# Patient Record
Sex: Female | Born: 1985
Health system: Southern US, Community
[De-identification: ages and names within clinical notes are randomized; demographics above are authoritative.]

## PROBLEM LIST (undated history)

## (undated) ENCOUNTER — Inpatient Hospital Stay: Payer: Self-pay

## (undated) DIAGNOSIS — F329 Major depressive disorder, single episode, unspecified: Secondary | ICD-10-CM

## (undated) DIAGNOSIS — E785 Hyperlipidemia, unspecified: Secondary | ICD-10-CM

## (undated) DIAGNOSIS — F32A Depression, unspecified: Secondary | ICD-10-CM

## (undated) DIAGNOSIS — E669 Obesity, unspecified: Secondary | ICD-10-CM

## (undated) DIAGNOSIS — G43909 Migraine, unspecified, not intractable, without status migrainosus: Secondary | ICD-10-CM

## (undated) DIAGNOSIS — E282 Polycystic ovarian syndrome: Secondary | ICD-10-CM

## (undated) DIAGNOSIS — F419 Anxiety disorder, unspecified: Secondary | ICD-10-CM

## (undated) HISTORY — DX: Hyperlipidemia, unspecified: E78.5

## (undated) HISTORY — DX: Migraine, unspecified, not intractable, without status migrainosus: G43.909

## (undated) HISTORY — DX: Polycystic ovarian syndrome: E28.2

## (undated) HISTORY — DX: Major depressive disorder, single episode, unspecified: F32.9

## (undated) HISTORY — DX: Anxiety disorder, unspecified: F41.9

## (undated) HISTORY — DX: Depression, unspecified: F32.A

## (undated) HISTORY — PX: HUMERUS FRACTURE SURGERY: SHX670

---
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
## 2006-09-21 ENCOUNTER — Emergency Department: Payer: Self-pay | Admitting: Emergency Medicine

## 2006-09-23 ENCOUNTER — Ambulatory Visit: Payer: Self-pay | Admitting: Emergency Medicine

## 2007-02-01 ENCOUNTER — Emergency Department: Payer: Self-pay

## 2008-11-24 ENCOUNTER — Emergency Department: Payer: Self-pay | Admitting: Emergency Medicine

## 2009-04-19 HISTORY — PX: WISDOM TOOTH EXTRACTION: SHX21

## 2011-04-20 HISTORY — PX: APPENDECTOMY: SHX54

## 2011-09-09 LAB — CBC
HGB: 13.7 g/dL (ref 12.0–16.0)
MCH: 27.9 pg (ref 26.0–34.0)
MCHC: 32.4 g/dL (ref 32.0–36.0)
MCV: 86 fL (ref 80–100)
Platelet: 304 10*3/uL (ref 150–440)
RBC: 4.91 10*6/uL (ref 3.80–5.20)
WBC: 13.1 10*3/uL — ABNORMAL HIGH (ref 3.6–11.0)

## 2011-09-09 LAB — COMPREHENSIVE METABOLIC PANEL
Alkaline Phosphatase: 54 U/L (ref 50–136)
BUN: 8 mg/dL (ref 7–18)
Bilirubin,Total: 0.8 mg/dL (ref 0.2–1.0)
Calcium, Total: 8.9 mg/dL (ref 8.5–10.1)
Chloride: 104 mmol/L (ref 98–107)
Creatinine: 0.58 mg/dL — ABNORMAL LOW (ref 0.60–1.30)
EGFR (African American): >60
EGFR (Non-African Amer.): >60
Osmolality: 277 (ref 275–301)
Potassium: 4.1 mmol/L (ref 3.5–5.1)
SGOT(AST): 30 U/L (ref 15–37)
SGPT (ALT): 66 U/L
Total Protein: 8 g/dL (ref 6.4–8.2)

## 2011-09-09 LAB — URINALYSIS, COMPLETE
Bacteria: NONE SEEN
Glucose,UR: NEGATIVE mg/dL (ref 0–75)
Ketone: NEGATIVE
Nitrite: NEGATIVE
Specific Gravity: 1.015 (ref 1.003–1.030)
Squamous Epithelial: 7
WBC UR: 1 /HPF (ref 0–5)

## 2011-09-09 LAB — PREGNANCY, URINE: Pregnancy Test, Urine: NEGATIVE m[IU]/mL

## 2011-09-09 LAB — LIPASE, BLOOD: Lipase: 115 U/L (ref 73–393)

## 2011-09-10 ENCOUNTER — Observation Stay: Payer: Self-pay | Admitting: Surgery

## 2011-09-13 LAB — PATHOLOGY REPORT

## 2012-08-15 ENCOUNTER — Inpatient Hospital Stay: Payer: Self-pay | Admitting: Student

## 2012-08-15 LAB — COMPREHENSIVE METABOLIC PANEL
Anion Gap: 4 — ABNORMAL LOW (ref 7–16)
BUN: 11 mg/dL (ref 7–18)
Chloride: 103 mmol/L (ref 98–107)
Co2: 30 mmol/L (ref 21–32)
Creatinine: 0.69 mg/dL (ref 0.60–1.30)
Glucose: 87 mg/dL (ref 65–99)
Osmolality: 273 (ref 275–301)
SGOT(AST): 22 U/L (ref 15–37)
Sodium: 137 mmol/L (ref 136–145)

## 2012-08-15 LAB — URINALYSIS, COMPLETE
Bilirubin,UR: NEGATIVE
Glucose,UR: NEGATIVE mg/dL (ref 0–75)
Nitrite: NEGATIVE
Ph: 5 (ref 4.5–8.0)
Protein: NEGATIVE
RBC,UR: 5 /HPF (ref 0–5)
Specific Gravity: 1.023 (ref 1.003–1.030)
Squamous Epithelial: 7

## 2012-08-15 LAB — CBC
HCT: 39.5 % (ref 35.0–47.0)
HGB: 13.3 g/dL (ref 12.0–16.0)
MCH: 29 pg (ref 26.0–34.0)
MCHC: 33.7 g/dL (ref 32.0–36.0)
Platelet: 362 10*3/uL (ref 150–440)
RBC: 4.59 10*6/uL (ref 3.80–5.20)
WBC: 8.8 10*3/uL (ref 3.6–11.0)

## 2012-08-16 LAB — CBC WITH DIFFERENTIAL/PLATELET
Basophil: 3 %
Comment - H1-Com1: NORMAL
HCT: 36.9 % (ref 35.0–47.0)
Lymphocytes: 27 %
MCH: 29.5 pg (ref 26.0–34.0)
MCV: 86 fL (ref 80–100)
Monocytes: 10 %
RBC: 4.32 10*6/uL (ref 3.80–5.20)
Segmented Neutrophils: 54 %
WBC: 9.8 10*3/uL (ref 3.6–11.0)

## 2012-08-16 LAB — BASIC METABOLIC PANEL
BUN: 10 mg/dL (ref 7–18)
Calcium, Total: 8.8 mg/dL (ref 8.5–10.1)
Creatinine: 0.47 mg/dL — ABNORMAL LOW (ref 0.60–1.30)
EGFR (African American): >60
EGFR (Non-African Amer.): >60

## 2012-08-16 LAB — TSH: Thyroid Stimulating Horm: 10.6 u[IU]/mL — ABNORMAL HIGH

## 2012-08-16 LAB — LIPID PANEL
HDL Cholesterol: 20 mg/dL — ABNORMAL LOW (ref 40–60)
VLDL Cholesterol, Calc: 46 mg/dL — ABNORMAL HIGH (ref 5–40)

## 2012-08-16 LAB — HEMOGLOBIN A1C: Hemoglobin A1C: 4.3 % (ref 4.2–6.3)

## 2012-08-17 LAB — VANCOMYCIN, TROUGH: Vancomycin, Trough: 9 ug/mL — ABNORMAL LOW (ref 10–20)

## 2012-08-18 LAB — VANCOMYCIN, TROUGH: Vancomycin, Trough: 8 ug/mL — ABNORMAL LOW (ref 10–20)

## 2012-09-07 LAB — TSH: TSH: 4.21 u[IU]/mL (ref 0.41–5.90)

## 2012-12-19 ENCOUNTER — Ambulatory Visit: Payer: Self-pay

## 2013-01-31 LAB — LIPID PANEL
Cholesterol: 220 mg/dL — AB (ref 0–200)
HDL: 25 mg/dL — AB (ref 35–70)
LDL Cholesterol: 138 mg/dL

## 2013-02-14 LAB — CBC AND DIFFERENTIAL
HEMATOCRIT: 39 % (ref 36–46)
HEMOGLOBIN: 13.1 g/dL (ref 12.0–16.0)
PLATELETS: 342 10*3/uL (ref 150–399)

## 2013-02-14 LAB — BASIC METABOLIC PANEL
CREATININE: 0.6 mg/dL (ref 0.5–1.1)
Glucose: 78 mg/dL

## 2013-02-14 LAB — HEPATIC FUNCTION PANEL: BILIRUBIN, TOTAL: 0.5 mg/dL

## 2013-02-14 LAB — HEMOGLOBIN A1C: Hemoglobin A1C: 5.3

## 2013-12-07 ENCOUNTER — Emergency Department: Payer: Self-pay | Admitting: Emergency Medicine

## 2013-12-07 LAB — CBC WITH DIFFERENTIAL/PLATELET
BASOS PCT: 1 %
Basophil #: 0.1 10*3/uL (ref 0.0–0.1)
EOS PCT: 0.5 %
Eosinophil #: 0.1 10*3/uL (ref 0.0–0.7)
HCT: 39.4 % (ref 35.0–47.0)
HGB: 12.7 g/dL (ref 12.0–16.0)
LYMPHS ABS: 1.2 10*3/uL (ref 1.0–3.6)
LYMPHS PCT: 8.1 %
MCH: 28.1 pg (ref 26.0–34.0)
MCHC: 32.2 g/dL (ref 32.0–36.0)
MCV: 87 fL (ref 80–100)
MONO ABS: 1.6 x10 3/mm — AB (ref 0.2–0.9)
MONOS PCT: 10.5 %
NEUTROS ABS: 11.9 10*3/uL — AB (ref 1.4–6.5)
Neutrophil %: 79.9 %
Platelet: 272 10*3/uL (ref 150–440)
RBC: 4.52 10*6/uL (ref 3.80–5.20)
RDW: 13.1 % (ref 11.5–14.5)
WBC: 14.9 10*3/uL — ABNORMAL HIGH (ref 3.6–11.0)

## 2013-12-07 LAB — MONONUCLEOSIS SCREEN: Mono Test: NEGATIVE

## 2013-12-09 LAB — BETA STREP CULTURE(ARMC)

## 2014-01-26 ENCOUNTER — Emergency Department: Payer: Self-pay | Admitting: Emergency Medicine

## 2014-01-26 LAB — URINALYSIS, COMPLETE
BACTERIA: NONE SEEN
BILIRUBIN, UR: NEGATIVE
Blood: NEGATIVE
Glucose,UR: NEGATIVE mg/dL (ref 0–75)
Ketone: NEGATIVE
Leukocyte Esterase: NEGATIVE
Nitrite: NEGATIVE
PROTEIN: NEGATIVE
Ph: 6 (ref 4.5–8.0)
RBC,UR: <1 /HPF (ref 0–5)
Specific Gravity: 1.006 (ref 1.003–1.030)
Squamous Epithelial: 1
WBC UR: NONE SEEN /HPF (ref 0–5)

## 2014-01-26 LAB — CBC WITH DIFFERENTIAL/PLATELET
Basophil #: 0.1 10*3/uL (ref 0.0–0.1)
Basophil %: 0.6 %
EOS ABS: 0.2 10*3/uL (ref 0.0–0.7)
Eosinophil %: 1.3 %
HCT: 36.7 % (ref 35.0–47.0)
HGB: 11.7 g/dL — ABNORMAL LOW (ref 12.0–16.0)
Lymphocyte #: 2 10*3/uL (ref 1.0–3.6)
Lymphocyte %: 14 %
MCH: 27.9 pg (ref 26.0–34.0)
MCHC: 31.8 g/dL — ABNORMAL LOW (ref 32.0–36.0)
MCV: 88 fL (ref 80–100)
Monocyte #: 1.2 x10 3/mm — ABNORMAL HIGH (ref 0.2–0.9)
Monocyte %: 8.6 %
Neutrophil #: 10.8 10*3/uL — ABNORMAL HIGH (ref 1.4–6.5)
Neutrophil %: 75.5 %
Platelet: 297 10*3/uL (ref 150–440)
RBC: 4.18 10*6/uL (ref 3.80–5.20)
RDW: 13.2 % (ref 11.5–14.5)
WBC: 14.3 10*3/uL — ABNORMAL HIGH (ref 3.6–11.0)

## 2014-01-26 LAB — COMPREHENSIVE METABOLIC PANEL
ALT: 43 U/L
Albumin: 3.7 g/dL (ref 3.4–5.0)
Alkaline Phosphatase: 66 U/L
BUN: 7 mg/dL (ref 7–18)
Bilirubin,Total: 1.4 mg/dL — ABNORMAL HIGH (ref 0.2–1.0)
CHLORIDE: 106 mmol/L (ref 98–107)
CO2: 27 mmol/L (ref 21–32)
Calcium, Total: 8.4 mg/dL — ABNORMAL LOW (ref 8.5–10.1)
Creatinine: 0.71 mg/dL (ref 0.60–1.30)
GLUCOSE: 83 mg/dL (ref 65–99)
POTASSIUM: 3.8 mmol/L (ref 3.5–5.1)
SGOT(AST): 23 U/L (ref 15–37)
Sodium: 140 mmol/L (ref 136–145)
Total Protein: 7.8 g/dL (ref 6.4–8.2)

## 2014-01-30 ENCOUNTER — Emergency Department: Payer: Self-pay | Admitting: Emergency Medicine

## 2014-01-31 LAB — CULTURE, BLOOD (SINGLE)

## 2014-02-01 ENCOUNTER — Emergency Department: Payer: Self-pay | Admitting: Emergency Medicine

## 2014-04-07 ENCOUNTER — Emergency Department: Payer: Self-pay | Admitting: Emergency Medicine

## 2014-08-09 NOTE — Discharge Summary (Signed)
PATIENT NAME:  Cynthia George, Cynthia George MR#:  [id number] DATE OF BIRTH:  1985/09/08  DATE OF ADMISSION:  08/15/2012 DATE OF DISCHARGE:  08/18/2012  CHIEF COMPLAINT: Leg abscess and cellulitis.   DISCHARGE DIAGNOSES:  1.  Left leg cellulitis, status post trauma with softball.  2.  History of hypercholesterolemia.  3.  Elevated TSH.   DISCHARGE MEDICATIONS:  1.  Clindamycin 300 mg every 6 hours for 7 days.  2.  Ultram 50 mg 1 tab 1 to 2 times a day as needed for pain.   FOLLOWUP: Please follow with PCP within 1 to 2 weeks. Dry bandage change daily.   DIET: Low-fat, low-cholesterol.   ACTIVITY: As tolerated. May go back to work in full capacity in a week.   DISCHARGE INSTRUCTIONS: Check full thyroid function tests in 4 to 6 weeks. Take over-the-counter probiotic yogurt twice a day for 2 weeks.   SIGNIFICANT LABORATORY STUDIES AND IMAGING: Wound cultures: No growth to date. Urinalysis: No nitrites, 2+ leukocyte esterase, 3 WBC, trace bacteria. Initial WBC 8.8, hemoglobin 13.7, platelets 362. TSH of 10.6, free thyroxine 0.87. LFTs: Total protein 8.5, otherwise within normal limits. BNP on arrival showed creatinine of 0.69, sodium 137, potassium 4.1. Tib-fib on the left showing no acute osseous injury.   HISTORY OF PRESENT ILLNESS AND HOSPITAL COURSE: For full details of H and P, please see the dictation on April 29 by Dr. Jacques NavyAhmadzia but briefly, this is an obese 29 year old Caucasian female with familial hypercholesterolemia, who presented after developing cellulitis in the left shin area after getting hit with a softball some time prior to that, who developed progressive redness, swelling and warmth. She had [facility] and was given doxycycline and x-rays there did not show any fracture. However, she failed outpatient therapy and was admitted to the hospitalist service with progressive redness and cellulitis, some drainage at the site, with fever of 100.6 as an outpatient.  She presented here. She was started on vancomycin and Invanz was added for broader coverage of gram-negatives. However, she did not appear to have sepsis or SIRS by criteria here. The cultures have been negative. On antibiotics IV, she is feeling better and the cellulitis has almost resolved. The wound has some minor or drainage. The cultures have been no growth to date and at this point, she will be discharged with clindamycin and over-the-counter probiotics. She was discharged on Ultram for pain p.r.n. Furthermore, she was noted to have elevated TSH as described above and should obtain full thyroid function panel in about 4 to 6 weeks.   CODE STATUS: Full code.   TOTAL TIME SPENT: 32 minutes.   ____________________________ Krystal EatonShayiq Amiee Wiley, MD sa:jm George: 08/18/2012 14:52:20 ET T: 08/18/2012 17:01:25 ET JOB#: [id number]  cc: Krystal EatonShayiq Sidra Oldfield, MD, <Dictator> Krystal EatonSHAYIQ Mannie Ohlin MD ELECTRONICALLY SIGNED 09/11/2012 15:10

## 2014-08-09 NOTE — H&P (Signed)
PATIENT NAME:  Cynthia George, Cynthia George MR#:  [id number] DATE OF BIRTH:  1985/11/18  DATE OF ADMISSION:  08/15/2012  PRIMARY CARE PHYSICIAN:  None.   REFERRING PHYSICIAN:  Dr. Cyril LoosenKinner.   CHIEF COMPLAINT:  Leg abscess and cellulitis.   HISTORY OF PRESENT ILLNESS:  The patient is a pleasant 29 year old obese Caucasian female with history of familial hypercholesterolemia, no longer on medications, no PCP, who  was hit on the left shin with a softball about 3 weeks ago, who slowly developed redness, swelling and warmth. She [facility] a few days ago where she was started on doxycycline, and x-rays of tibia and fibula was done. There is some swelling there, and they thought there was hematoma. She went back yesterday, as she had developed some bruising laterally, which was drained. Per patient, it drained some clots, as well as some pus and  surrounding cellulitis was marked. Last night, she developed a fever of 100.6, some nausea and she realized that the redness and warmth had crossed the marked area, which was drawn yesterday, and therefore presented to the hospital.   PAST MEDICAL HISTORY:  History of ovarian cyst, history of familial hypercholesterolemia,   PAST SURGICAL HISTORY:  History of arm surgery, adenoidectomy, appendectomy, tonsillectomy.   ALLERGIES:  CIPRO, FLAGYL, KEFLEX AND SEPTRA, ALL CAUSING A RASH. THE PATIENT STATES SHE IS NOT ALLERGIC TO PENICILLINS, HOWEVER.   FAMILY HISTORY:   Both mom and dad with hypertension and heart attacks. There is also a family history of diabetes and hypercholesterolemia.   SOCIAL HISTORY:  No tobacco, alcohol or drug use. Works as a LawyerCNA.   OUTPATIENT MEDICATIONS:  Norco 5/325 mg 1 to 2 tabs every 4 to 6 hours as needed, which was started for pain, as well as doxycycline 100 mg b.i.George. Today is day 5.  REVIEW OF SYSTEMS:   CONSTITUTIONAL:  Positive for fever last night. No weakness. Has left shin pain. No weight changes.  EYES:   No blurry vision or double vision.  EARS, NOSE AND THROAT:  No tinnitus or hearing loss. Occasional headaches.  RESPIRATORY:  No cough, wheezing or shortness of breath. No painful respirations.  CARDIOVASCULAR:  No chest pain. No orthopnea. No hypertension.  GASTROINTESTINAL:  Had a bout of nausea, vomiting a few days ago, resolved. No abdominal pain, hematemesis, melena or dark stools.  GENITOURINARY:  Denies dysuria, hematuria.  HEMATOLOGIC AND LYMPHATIC:  Denies anemia or easy bruising.  SKIN:  Rashes in the left shin area.  MUSCULOSKELETAL:  Denies arthritis or gout.  NEUROLOGIC:  Denies weakness, numbness or TIAs.  PSYCHIATRIC:  Denies anxiety or insomnia.   PHYSICAL EXAMINATION: VITAL SIGNS:  Temperature on arrival 98.3, pulse rate 77, respiratory rate 16, blood pressure 127/68, O2 sat 99% on room air.  GENERAL:  The patient is a morbidly obese, Caucasian female, lying in bed in no obvious distress.  HEENT: Normocephalic, atraumatic. Pupils are equal and reactive. Anicteric sclerae. Extraocular muscles intact. Moist mucous membranes.  NECK:  Supple. No thyroid tenderness. No cervical lymphadenopathy.  CARDIOVASCULAR:  S1, S2, regular rate and rhythm. No murmurs, rubs or gallops.  LUNGS:  Clear to auscultation without wheezing, rhonchi or rales.  ABDOMEN:  Soft, obese, nontender, nondistended. Positive bowel sounds in all quadrants. No organomegaly appreciated.  EXTREMITIES:  No significant lower extremity edema. On examination of the left lower extremity, the patient appears to have significant redness, swelling in the anterior mid shin area. There is an area of  opening about a 1.5 cm with minimum serosanguineous drainage with surrounding cellulitis. The cellulitis goes medially in her shin.  The area has been marked today by me as well.   NEUROLOGICAL: Cranial nerves II through XII are grossly intact. Strength is 5/5 in all extremities. Sensation is intact to light touch.  PSYCHIATRIC:  Awake, alert, oriented x 3. Cooperative, pleasant.   RADIOLOGIC AND LABORATORY DATA:  Glucose 87, creatinine 0.69, BUN 11, sodium 137, potassium 4.1. LFTs: Total protein 8.5, otherwise within normal limits. WBC 8.8, hemoglobin 13.3, platelets are 362.   UA:  No nitrites, 2+ leukocyte esterase, 3 WBC, trace bacteria. X-rays of the left tibia-fibula performed, not uploaded yet, not read yet.   ASSESSMENT AND PLAN:  We have a pleasant 29 year old female with a history of familial hypercholesterolemia, obesity, who presents status post being hit with a softball about 3 weeks ago, who has slowly developed cellulitis and an abscess, who is on day 5 of doxycycline. She appears to have failed outpatient therapy with resulting cellulitis and abscess. 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 The vanco should cover strep and MRSA, as SHE HAS ALLERGY TO KEFLEX. She says she can take penicillin, however. We would wait for the wound cultures and see what the blood cultures show.  We will start the patient on some IV fluids and pain control. Would start the patient on Norco for pain.  In regards to her history of hypercholesterolemia, would check a lipid profile. She would also have her hemoglobin A1c and TSH checked.  We will start the patient on heparin for DVT prophylaxis and Tylenol for pain and fever.   CODE STATUS:  The patient is full code.   TOTAL TIME SPENT:  50 minutes.    ____________________________ Krystal Eaton, MD sa:dmm George: 08/15/2012 21:02:03 ET T: 08/15/2012 21:29:26 ET JOB#: [id number]  cc: Krystal Eaton, MD, <Dictator> Krystal Eaton MD ELECTRONICALLY SIGNED 09/19/2012 12:26

## 2014-08-11 NOTE — Op Note (Signed)
PATIENT NAME:  Cynthia George, Cynthia George MR#:  [id number] DATE OF BIRTH:  [birth year]  DATE OF PROCEDURE:  09/10/2011  PREOPERATIVE DIAGNOSIS: Acute appendicitis.   POSTOPERATIVE DIAGNOSIS: Acute appendicitis, suppurative.   PROCEDURE PERFORMED: Laparoscopic appendectomy.   SURGEON: Raynald KempMark A Jalei Shibley, M.George.   ASSISTANT: None.   ANESTHESIA: General endotracheal.   SPECIMENS: Appendix.  ESTIMATED BLOOD LOSS: 25 mL.  DRAINS: None. COUNT: LAP and needle count correct x2.   DESCRIPTION OF PROCEDURE: With the patient in the supine position, general oral endotracheal anesthesia was induced. The patient's left arm was padded and tucked at her side. Her abdomen was sterilely prepped and draped with ChloraPrep solution. Perioperative antibiotics and deep vein thrombosis prophylaxis were administered. A time-out was observed.   A 12 mm blunt Hassan trocar was placed through an open technique with stay sutures being passed through the fascia. Pneumoperitoneum was established. 15 mmHg CO2 insufflation was utilized. A 12 mm Bladeless trocar was placed in the right upper quadrant tangentially.   A 5 mm Bladeless trocar was placed in the suprapubic area. The appendix was identified. Some retrocecal attachments were taken down with sharp dissection and point cautery. The appendix appeared to be mildly swollen. The base of the appendix was normal. There was no pus. The remaining intra-abdominal organs appeared to be grossly normal to limited laparoscopic evaluation.   A window was fashioned at the base of the appendix with blunt technique. The patient's appendix was transected off the cecum with two fires of the endoscopic blue load of the stapler. The mesoappendix was then taken between two fires of the endoscopic white load of the stapler. Hemostasis appeared to be adequate. One application of point cautery was applied to the mesoappendix staple line. Surgicel with 5,000 units thrombin was applied. Hemostasis appeared to  be adequate. The right lower quadrant was irrigated with a total of 2 liters of normal saline during the operation.   With ports removed under direct visualization, the appendix was placed in an EndoCatch device and retrieved.   The infraumbilical fascial defect was then closed with a figure-of-eight #0 Vicryl suture and the existing stay sutures tied to each other. A total of 30 mL of 0.25% plain Marcaine was infiltrated along all skin and fascial incisions prior to closure.   4-0 nylon in simple and vertical mattress orientation was applied to the skin edges. Sterile occlusive dressing was placed. The patient was then subsequently extubated in the recovery room in stable and satisfactory condition by anesthesia.   ____________________________ Redge GainerMark A. Egbert GaribaldiBird, MD mab:ap George: 09/10/2011 07:08:17 ET T: 09/10/2011 12:28:04 ET JOB#: [id number]  cc: Loraine LericheMark A. Egbert GaribaldiBird, MD, <Dictator> Raynald KempMARK A Anayiah Howden MD ELECTRONICALLY SIGNED 09/10/2011 14:21

## 2014-08-11 NOTE — H&P (Signed)
Subjective/Chief Complaint 1 week of abdominal pain now in RLQ only.    History of Present Illness 29 year old healthy obese female visiting from [place] presents with a 1 week history of vague abdominal pain which has become much worse over the last 24 hours with one episode of emesis, no sick contacts. no previous abdominal operations.  Her pain initailly started in peri-umibilical region and has migrated and stayed in the RLQ.    Past History left humeral fx, T and A removal of hardware left arm    Primary Physician none local.   Past Med/Surgical Hx:  ovarian cyst:   Hypercholesterolemia:   arm surgery: had metal plate and 8 screws placed in left arm in 2002 then removed 1 year later  Tonsillectomy:   ALLERGIES:  Cipro: Rash  Flagyl: Rash  Keflex: Rash  Septra: Rash    Other Allergies none     Medications none.   Family and Social History:   Family History Non-Contributory    Social History negative tobacco, negative ETOH, negative Illicit drugs    Place of Living Home   Review of Systems:   Subjective/Chief Complaint see above.    Abdominal Pain Yes    Nausea/Vomiting Yes    Tolerating Diet No  Nauseated  Vomiting  results in pain.    Medications/Allergies Reviewed Medications/Allergies reviewed   Physical Exam:   GEN no acute distress, obese, temp 97 .0, p99, 150/75 rr 18/min.    HEENT pale conjunctivae    NECK supple  No masses  thyroid not tender    RESP normal resp effort  clear BS  no use of accessory muscles    CARD regular rate  no murmur    ABD positive tenderness  no liver/spleen enlargement  soft  distended  normal BS  rovsings and mcburney point tenderness.    LYMPH negative neck    EXTR negative cyanosis/clubbing    SKIN normal to palpation, No rashes, No ulcers, skin turgor good    NEURO cranial nerves intact    PSYCH A+O to time, place, person     Routine UA:  23-May-13 18:34    Color (UA) Yellow   Clarity (UA) Hazy    Glucose (UA) Negative   Bilirubin (UA) Negative   Ketones (UA) Negative   Specific Gravity (UA) 1.015   Blood (UA) Negative   pH (UA) 5.0   Protein (UA) Negative   Nitrite (UA) Negative   Leukocyte Esterase (UA) Negative   RBC (UA) 1 /HPF   WBC (UA) 1 /HPF   Epithelial Cells (UA) 7 /HPF   Mucous (UA) PRESENT  Routine Sero:  23-May-13 18:34    Pregnancy Test, Urine NEGATIVE  Routine Hem:  23-May-13 18:34    WBC (CBC) 13.1   RBC (CBC) 4.91   Hemoglobin (CBC) 13.7   Hematocrit (CBC) 42.4   Platelet Count (CBC) 304   MCV 86   MCH 27.9   MCHC 32.4   RDW 13.1  Routine Chem:  23-May-13 18:34    Glucose, Serum 79   BUN 8   Creatinine (comp) 0.58   Sodium, Serum 140   Potassium, Serum 4.1   Chloride, Serum 104   CO2, Serum 27   Calcium (Total), Serum 8.9  Hepatic:  23-May-13 18:34    Bilirubin, Total 0.8   Alkaline Phosphatase 54   SGPT (ALT) 66   SGOT (AST) 30   Total Protein, Serum 8.0   Albumin, Serum 3.9  Routine  Chem:  23-May-13 18:34    Osmolality (calc) 277   eGFR (African American) >60   eGFR (Non-African American) >60   Anion Gap 9   Lipase 115   Radiology Results: CT:    23-May-13 22:56, CT Abdomen and Pelvis With Contrast   CT Abdomen and Pelvis With Contrast   REASON FOR EXAM:    (1) abdominal pain; (2) abdominal pain  COMMENTS:       PROCEDURE: CT  - CT ABDOMEN / PELVIS  W  - Sep 09 2011 10:56PM     RESULT: CT of the abdomen and pelvis is performed with 100 mL of   Isovue-300 iodinated intravenous contrast and oral contrast with images   reconstructed at 3.0 mm slice thickness in the axial plane. The patient   has a previous exam from 25 November 2008.    The appendix is visualized between images one hundred and 113. Diameter   of the appendix measures up to approximately 11 mm which is slightly   prominent. There appears to be very mild adjacent inflammation especially   anterior to the iliopsoas muscle in the area of images 99 and 100.    Correlate for early or mild appendicitis. The base of the appendix     contains some oral contrast but the remaining portion does not. No   definite appendicolith is demonstrated. Surgical consultation should be   considered. Some mildly prominent mesenteric lymph nodes are noted in the   right lower quadrant suggestive of mesenteric adenitis. Slightly   prominent retroperitoneal lymph nodes are noted in the left para-aortic   region just inferior to the left renal artery and vein but appears stable   compared to the previous study 3 years ago. The pelvic structures   otherwise are unremarkable. The abdominal wall appears intact. Mild fatty   infiltration is noted in the liver. The spleen, pancreas, adrenal glands,   aorta, kidneys and gallbladder appear within normal limits. The stomach,   small bowel and colon are otherwise unremarkable. The bony structures   appear within normal limits. Some mildly prominent bilateral inguinal   lymph nodes appear unchanged. The lung bases show normal aeration.    IMPRESSION:   1. Findings concerning for early ormild acute appendicitis. Correlate   with clinical and laboratory data. Surgical consultation is suggested.  2. Scattered prominent mesenteric, inguinal and retroperitoneal lymph   nodes. These do not appear to have changed since the previous study in   2010.    Dictation Site: 6          Verified By: Sundra Aland, M.D., MD     Assessment/Admission Diagnosis 29 year old female with acute appendicitis both by clinical exam and ct scan scan imaging. leukocytosis secondary to acute appendicitis.    Plan admit, lap appy, IV invanz, discussed procedure with patient in detail and all questions addressed.   Electronic Signatures: Sherri Rad (MD)  (Signed [id number] 00:18)  Authored: CHIEF COMPLAINT and HISTORY, PAST MEDICAL/SURGIAL HISTORY, ALLERGIES, Other Allergies, HOME MEDICATIONS, OTHER MEDICATIONS, FAMILY AND SOCIAL HISTORY,  REVIEW OF SYSTEMS, PHYSICAL EXAM, LABS, Radiology, ASSESSMENT AND PLAN   Last Updated: 24-May-13 00:18 by Sherri Rad (MD)

## 2015-01-24 ENCOUNTER — Ambulatory Visit (INDEPENDENT_AMBULATORY_CARE_PROVIDER_SITE_OTHER): Payer: 59 | Admitting: Family Medicine

## 2015-01-24 ENCOUNTER — Encounter: Payer: Self-pay | Admitting: Family Medicine

## 2015-01-24 VITALS — BP 130/84 | HR 69 | Temp 97.5°F | Ht 62.2 in | Wt 225.7 lb

## 2015-01-24 DIAGNOSIS — E282 Polycystic ovarian syndrome: Secondary | ICD-10-CM | POA: Diagnosis not present

## 2015-01-24 DIAGNOSIS — G43109 Migraine with aura, not intractable, without status migrainosus: Secondary | ICD-10-CM

## 2015-01-24 DIAGNOSIS — F329 Major depressive disorder, single episode, unspecified: Secondary | ICD-10-CM | POA: Diagnosis not present

## 2015-01-24 DIAGNOSIS — G43909 Migraine, unspecified, not intractable, without status migrainosus: Secondary | ICD-10-CM | POA: Insufficient documentation

## 2015-01-24 DIAGNOSIS — E785 Hyperlipidemia, unspecified: Secondary | ICD-10-CM | POA: Diagnosis not present

## 2015-01-24 DIAGNOSIS — F32A Depression, unspecified: Secondary | ICD-10-CM | POA: Insufficient documentation

## 2015-01-24 LAB — LIPID PANEL PICCOLO, WAIVED
CHOL/HDL RATIO PICCOLO,WAIVE: 8.6 mg/dL — AB
CHOLESTEROL PICCOLO, WAIVED: 238 mg/dL — AB (ref <200)
HDL Chol Piccolo, Waived: 28 mg/dL — ABNORMAL LOW (ref >59)
LDL Chol Calc Piccolo Waived: 140 mg/dL — ABNORMAL HIGH (ref <100)
Triglycerides Piccolo,Waived: 350 mg/dL — ABNORMAL HIGH (ref <150)
VLDL Chol Calc Piccolo,Waive: 70 mg/dL — ABNORMAL HIGH (ref <30)

## 2015-01-24 LAB — BAYER DCA HB A1C WAIVED: HB A1C: 5.1 % (ref <7.0)

## 2015-01-24 NOTE — Assessment & Plan Note (Signed)
Continue to follow with GYN. Planning to get pregnant in May. Not interested in OCP at this time. Sugars normal. Consider spironalactone or low dose metformin in the future.

## 2015-01-24 NOTE — Assessment & Plan Note (Signed)
Quite elevated today, fasting. Will work on diet and exercise and start fish oil, low threshold for starting statin considering high prevalence of early MI on both sides of her family (Mom 63, Dad 61, PGF 42).

## 2015-01-24 NOTE — Patient Instructions (Addendum)
Fish oil 2000mg  daily  Fat and Cholesterol Restricted Diet High levels of fat and cholesterol in your blood may lead to various health problems, such as diseases of the heart, blood vessels, gallbladder, liver, and pancreas. Fats are concentrated sources of energy that come in various forms. Certain types of fat, including saturated fat, may be harmful in excess. Cholesterol is a substance needed by your body in small amounts. Your body makes all the cholesterol it needs. Excess cholesterol comes from the food you eat. When you have high levels of cholesterol and saturated fat in your blood, health problems can develop because the excess fat and cholesterol will gather along the walls of your blood vessels, causing them to narrow. Choosing the right foods will help you control your intake of fat and cholesterol. This will help keep the levels of these substances in your blood within normal limits and reduce your risk of disease. WHAT IS MY PLAN? Your health care provider recommends that you:  Get no more than __________ % of the total calories in your daily diet from fat.  Limit your intake of saturated fat to less than ______% of your total calories each day.  Limit the amount of cholesterol in your diet to less than _________mg per day. WHAT TYPES OF FAT SHOULD I CHOOSE?  Choose healthy fats more often. Choose monounsaturated and polyunsaturated fats, such as olive and canola oil, flaxseeds, walnuts, almonds, and seeds.  Eat more omega-3 fats. Good choices include salmon, mackerel, sardines, tuna, flaxseed oil, and ground flaxseeds. Aim to eat fish at least two times a week.  Limit saturated fats. Saturated fats are primarily found in animal products, such as meats, butter, and cream. Plant sources of saturated fats include palm oil, palm kernel oil, and coconut oil.  Avoid foods with partially hydrogenated oils in them. These contain trans fats. Examples of foods that contain trans fats are  stick margarine, some tub margarines, cookies, crackers, and other baked goods. WHAT GENERAL GUIDELINES DO I NEED TO FOLLOW? These guidelines for healthy eating will help you control your intake of fat and cholesterol:  Check food labels carefully to identify foods with trans fats or high amounts of saturated fat.  Fill one half of your plate with vegetables and green salads.  Fill one fourth of your plate with whole grains. Look for the word "whole" as the first word in the ingredient list.  Fill one fourth of your plate with lean protein foods.  Limit fruit to two servings a day. Choose fruit instead of juice.  Eat more foods that contain soluble fiber. Examples of foods that contain this type of fiber are apples, broccoli, carrots, beans, peas, and barley. Aim to get 20-30 g of fiber per day.  Eat more home-cooked food and less restaurant, buffet, and fast food.  Limit or avoid alcohol.  Limit foods high in starch and sugar.  Limit fried foods.  Cook foods using methods other than frying. Baking, boiling, grilling, and broiling are all great options.  Lose weight if you are overweight. Losing just 5-10% of your initial body weight can help your overall health and prevent diseases such as diabetes and heart disease. WHAT FOODS CAN I EAT? Grains Whole grains, such as whole wheat or whole grain breads, crackers, cereals, and pasta. Unsweetened oatmeal, bulgur, barley, quinoa, or brown rice. Corn or whole wheat flour tortillas. Vegetables Fresh or frozen vegetables (raw, steamed, roasted, or grilled). Green salads. Fruits All fresh, canned (in natural juice),  or frozen fruits. Meat and Other Protein Products Ground beef (85% or leaner), grass-fed beef, or beef trimmed of fat. Skinless chicken or [place]. Ground chicken or [place]. Pork trimmed of fat. All fish and seafood. Eggs. Dried beans, peas, or lentils. Unsalted nuts or seeds. Unsalted canned or dry beans. Dairy Low-fat dairy  products, such as skim or 1% milk, 2% or reduced-fat cheeses, low-fat ricotta or cottage cheese, or plain low-fat yogurt. Fats and Oils Tub margarines without trans fats. Light or reduced-fat mayonnaise and salad dressings. Avocado. Olive, canola, sesame, or safflower oils. Natural peanut or almond butter (choose ones without added sugar and oil). The items listed above may not be a complete list of recommended foods or beverages. Contact your dietitian for more options. WHAT FOODS ARE NOT RECOMMENDED? Grains White bread. White pasta. White rice. Cornbread. Bagels, pastries, and croissants. Crackers that contain trans fat. Vegetables White potatoes. Corn. Creamed or fried vegetables. Vegetables in a cheese sauce. Fruits Dried fruits. Canned fruit in light or heavy syrup. Fruit juice. Meat and Other Protein Products Fatty cuts of meat. Ribs, chicken wings, bacon, sausage, bologna, salami, chitterlings, fatback, hot dogs, bratwurst, and packaged luncheon meats. Liver and organ meats. Dairy Whole or 2% milk, cream, half-and-half, and cream cheese. Whole milk cheeses. Whole-fat or sweetened yogurt. Full-fat cheeses. Nondairy creamers and whipped toppings. Processed cheese, cheese spreads, or cheese curds. Sweets and Desserts Corn syrup, sugars, honey, and molasses. Candy. Jam and jelly. Syrup. Sweetened cereals. Cookies, pies, cakes, donuts, muffins, and ice cream. Fats and Oils Butter, stick margarine, lard, shortening, ghee, or bacon fat. Coconut, palm kernel, or palm oils. Beverages Alcohol. Sweetened drinks (such as sodas, lemonade, and fruit drinks or punches). The items listed above may not be a complete list of foods and beverages to avoid. Contact your dietitian for more information.   This information is not intended to replace advice given to you by your health care provider. Make sure you discuss any questions you have with your health care provider.   Document Released: 04/05/2005  Document Revised: 04/26/2014 Document Reviewed: 07/04/2013 Elsevier Interactive Patient Education 2016 [employer]. Food Choices to Lower Your Triglycerides Triglycerides are a type of fat in your blood. High levels of triglycerides can increase the risk of heart disease and stroke. If your triglyceride levels are high, the foods you eat and your eating habits are very important. Choosing the right foods can help lower your triglycerides.  WHAT GENERAL GUIDELINES DO I NEED TO FOLLOW?  Lose weight if you are overweight.   Limit or avoid alcohol.   Fill one half of your plate with vegetables and green salads.   Limit fruit to two servings a day. Choose fruit instead of juice.   Make one fourth of your plate whole grains. Look for the word "whole" as the first word in the ingredient list.  Fill one fourth of your plate with lean protein foods.  Enjoy fatty fish (such as salmon, mackerel, sardines, and tuna) three times a week.   Choose healthy fats.   Limit foods high in starch and sugar.  Eat more home-cooked food and less restaurant, buffet, and fast food.  Limit fried foods.  Cook foods using methods other than frying.  Limit saturated fats.  Check ingredient lists to avoid foods with partially hydrogenated oils (trans fats) in them. WHAT FOODS CAN I EAT?  Grains Whole grains, such as whole wheat or whole grain breads, crackers, cereals, and pasta. Unsweetened oatmeal, bulgur, barley, quinoa, or  brown rice. Corn or whole wheat flour tortillas.  Vegetables Fresh or frozen vegetables (raw, steamed, roasted, or grilled). Green salads. Fruits All fresh, canned (in natural juice), or frozen fruits. Meat and Other Protein Products Ground beef (85% or leaner), grass-fed beef, or beef trimmed of fat. Skinless chicken or [place]. Ground chicken or [place]. Pork trimmed of fat. All fish and seafood. Eggs. Dried beans, peas, or lentils. Unsalted nuts or seeds. Unsalted canned or  dry beans. Dairy Low-fat dairy products, such as skim or 1% milk, 2% or reduced-fat cheeses, low-fat ricotta or cottage cheese, or plain low-fat yogurt. Fats and Oils Tub margarines without trans fats. Light or reduced-fat mayonnaise and salad dressings. Avocado. Safflower, olive, or canola oils. Natural peanut or almond butter. The items listed above may not be a complete list of recommended foods or beverages. Contact your dietitian for more options. WHAT FOODS ARE NOT RECOMMENDED?  Grains White bread. White pasta. White rice. Cornbread. Bagels, pastries, and croissants. Crackers that contain trans fat. Vegetables White potatoes. Corn. Creamed or fried vegetables. Vegetables in a cheese sauce. Fruits Dried fruits. Canned fruit in light or heavy syrup. Fruit juice. Meat and Other Protein Products Fatty cuts of meat. Ribs, chicken wings, bacon, sausage, bologna, salami, chitterlings, fatback, hot dogs, bratwurst, and packaged luncheon meats. Dairy Whole or 2% milk, cream, half-and-half, and cream cheese. Whole-fat or sweetened yogurt. Full-fat cheeses. Nondairy creamers and whipped toppings. Processed cheese, cheese spreads, or cheese curds. Sweets and Desserts Corn syrup, sugars, honey, and molasses. Candy. Jam and jelly. Syrup. Sweetened cereals. Cookies, pies, cakes, donuts, muffins, and ice cream. Fats and Oils Butter, stick margarine, lard, shortening, ghee, or bacon fat. Coconut, palm kernel, or palm oils. Beverages Alcohol. Sweetened drinks (such as sodas, lemonade, and fruit drinks or punches). The items listed above may not be a complete list of foods and beverages to avoid. Contact your dietitian for more information.   This information is not intended to replace advice given to you by your health care provider. Make sure you discuss any questions you have with your health care provider.   Document Released: 01/22/2004 Document Revised: 04/26/2014 Document Reviewed:  02/07/2013 Elsevier Interactive Patient Education [employer]

## 2015-01-24 NOTE — Assessment & Plan Note (Signed)
Under fair control at this time. Consider medication in the future. Continue to monitor.

## 2015-01-24 NOTE — Assessment & Plan Note (Signed)
Continue to follow with counselor as needed. Call if getting worse again.

## 2015-01-24 NOTE — Progress Notes (Signed)
BP 130/84 mmHg  Pulse 69  Temp(Src) 97.5 F (36.4 C)  Ht 5' 2.2" (1.58 m)  Wt 225 lb 11.2 oz (102.377 kg)  BMI 41.01 kg/m2  SpO2 99%  LMP 12/30/2014 (Exact Date)   Subjective:    Patient ID: Cynthia George, female    DOB: April 17, 1986, 29 y.o.   MRN: [id number]  HPI: Cynthia George is a 29 y.o. female who presents today to establish care. Has not seen a regular doctor in many years.   Chief Complaint  Patient presents with  . New Patient (Initial Visit)    Patient was diagnosed with PCOS, her GYN would like the following labs drawn, hgb A1C, TSH,CBC,CMP,and lipid panel. Patient not not had anything to eat or drink today.   Diagnosed with PCOS when she was 29yo. Following with GYN. Wants her to have menses naturally at least 4x a year. They seem to be decreasing. Has terrible symptoms when she has her period, has bad PMS with them. Never been on OCP for PCOS because of the migraines.   Haven't had her sugars checked in about 3-4 years. Has never had problems with elevated sugars  Has been seeing a therapist which has been helping. Concern about bipolar previously, but new counsellor doesn't think she has bipolar.  MIGRAINES Duration: since childhood Onset: sudden Severity: severe Quality: throbbing Frequency: once a month Location: global Headache duration: a couple of days Radiation: no Alleviating factors: caffiene Headache status at time of visit: asymptomatic Treatments attempted: Treatments attempted: aleve", excedrine   Aura: yes Nausea:  yes Vomiting: no Photophobia:  yes Phonophobia:  yes Effect on social functioning:  yes Confusion:  no Gait disturbance/ataxia:  no Behavioral changes:  no Fevers:  no   Relevant past medical, surgical, family and social history reviewed and updated as indicated. Interim medical history since our last visit reviewed. Allergies and medications reviewed and updated.  Review of Systems  Constitutional: Negative.    Respiratory: Negative.   Cardiovascular: Negative.   Gastrointestinal: Negative.   Psychiatric/Behavioral: Negative.    Per HPI unless specifically indicated above     Objective:    BP 130/84 mmHg  Pulse 69  Temp(Src) 97.5 F (36.4 C)  Ht 5' 2.2" (1.58 m)  Wt 225 lb 11.2 oz (102.377 kg)  BMI 41.01 kg/m2  SpO2 99%  LMP 12/30/2014 (Exact Date)  Wt Readings from Last 3 Encounters:  01/24/15 225 lb 11.2 oz (102.377 kg)    Physical Exam  Constitutional: She is oriented to person, place, and time. She appears well-developed and well-nourished. No distress.  HENT:  Head: Normocephalic and atraumatic.  Right Ear: Hearing normal.  Left Ear: Hearing normal.  Nose: Nose normal.  Eyes: Conjunctivae and lids are normal. Right eye exhibits no discharge. Left eye exhibits no discharge. No scleral icterus.  Cardiovascular: Normal rate, regular rhythm, normal heart sounds and intact distal pulses.  Exam reveals no gallop and no friction rub.   No murmur heard. Pulmonary/Chest: Effort normal and breath sounds normal. No respiratory distress. She has no wheezes. She has no rales. She exhibits no tenderness.  Musculoskeletal: Normal range of motion.  Neurological: She is alert and oriented to person, place, and time.  Skin: Skin is warm, dry and intact. No rash noted. No erythema. No pallor.  Psychiatric: She has a normal mood and affect. Her speech is normal and behavior is normal. Judgment and thought content normal. Cognition and memory are normal.  Nursing note and vitals reviewed.  Assessment & Plan:   Problem List Items Addressed This Visit      Cardiovascular and Mediastinum   Migraines    Under fair control at this time. Consider medication in the future. Continue to monitor.       Relevant Medications   aspirin-acetaminophen-caffeine (EXCEDRIN MIGRAINE) 250-250-65 MG tablet     Endocrine   PCOS (polycystic ovarian syndrome) - Primary    Continue to follow with GYN.  Planning to get pregnant in May. Not interested in OCP at this time. Sugars normal. Consider spironalactone or low dose metformin in the future.       Relevant Orders   TSH   CBC with Differential/Platelet   Bayer DCA Hb A1c Waived   Comprehensive metabolic panel   Lipid Panel Piccolo, Waived     Other   Hyperlipidemia    Quite elevated today, fasting. Will work on diet and exercise and start fish oil, low threshold for starting statin considering high prevalence of early MI on both sides of her family (Mom 68, Dad 31, PGF 42).       Depression    Continue to follow with counselor as needed. Call if getting worse again.           Follow up plan: Return in about 3 months (around 04/26/2015) for Physical Exam.

## 2015-01-25 LAB — CBC WITH DIFFERENTIAL/PLATELET
BASOS ABS: 0.1 10*3/uL (ref 0.0–0.2)
Basos: 1 %
EOS (ABSOLUTE): 0.2 10*3/uL (ref 0.0–0.4)
EOS: 4 %
HEMATOCRIT: 38.6 % (ref 34.0–46.6)
Hemoglobin: 12.7 g/dL (ref 11.1–15.9)
Immature Grans (Abs): 0 10*3/uL (ref 0.0–0.1)
Immature Granulocytes: 0 %
Lymphocytes Absolute: 1.5 10*3/uL (ref 0.7–3.1)
Lymphs: 27 %
MCH: 28.1 pg (ref 26.6–33.0)
MCHC: 32.9 g/dL (ref 31.5–35.7)
MCV: 85 fL (ref 79–97)
MONOCYTES: 8 %
MONOS ABS: 0.4 10*3/uL (ref 0.1–0.9)
NEUTROS PCT: 60 %
Neutrophils Absolute: 3.5 10*3/uL (ref 1.4–7.0)
Platelets: 332 10*3/uL (ref 150–379)
RBC: 4.52 x10E6/uL (ref 3.77–5.28)
RDW: 13.3 % (ref 12.3–15.4)
WBC: 5.7 10*3/uL (ref 3.4–10.8)

## 2015-01-25 LAB — COMPREHENSIVE METABOLIC PANEL
ALT: 38 IU/L — AB (ref 0–32)
AST: 19 IU/L (ref 0–40)
Albumin/Globulin Ratio: 1.6 (ref 1.1–2.5)
Albumin: 4.3 g/dL (ref 3.5–5.5)
Alkaline Phosphatase: 58 IU/L (ref 39–117)
BUN/Creatinine Ratio: 18 (ref 8–20)
BUN: 11 mg/dL (ref 6–20)
Bilirubin Total: 0.9 mg/dL (ref 0.0–1.2)
CALCIUM: 9.3 mg/dL (ref 8.7–10.2)
CO2: 24 mmol/L (ref 18–29)
CREATININE: 0.62 mg/dL (ref 0.57–1.00)
Chloride: 99 mmol/L (ref 97–108)
GFR calc Af Amer: 142 mL/min/{1.73_m2} (ref >59)
GFR calc non Af Amer: 123 mL/min/{1.73_m2} (ref >59)
GLOBULIN, TOTAL: 2.7 g/dL (ref 1.5–4.5)
GLUCOSE: 85 mg/dL (ref 65–99)
Potassium: 4.8 mmol/L (ref 3.5–5.2)
SODIUM: 140 mmol/L (ref 134–144)
Total Protein: 7 g/dL (ref 6.0–8.5)

## 2015-01-25 LAB — TSH: TSH: 3.48 u[IU]/mL (ref 0.450–4.500)

## 2015-01-27 ENCOUNTER — Encounter: Payer: Self-pay | Admitting: Family Medicine

## 2015-01-29 ENCOUNTER — Encounter: Payer: Self-pay | Admitting: Physician Assistant

## 2015-01-29 ENCOUNTER — Ambulatory Visit: Payer: Self-pay | Admitting: Physician Assistant

## 2015-01-29 VITALS — BP 132/75 | Temp 98.3°F | Wt 225.0 lb

## 2015-01-29 DIAGNOSIS — J018 Other acute sinusitis: Secondary | ICD-10-CM

## 2015-01-29 MED ORDER — PREDNISONE 10 MG PO TABS: 30.0000 mg | ORAL_TABLET | Freq: Every day | ORAL | Status: DC

## 2015-01-29 MED ORDER — AMOXICILLIN 875 MG PO TABS: 875.0000 mg | ORAL_TABLET | Freq: Two times a day (BID) | ORAL | Status: DC

## 2015-01-29 NOTE — Progress Notes (Signed)
S: c/o left sided ear pain and congestion, some change in hearing, ears feel full and like they need to pop, some sinus drainage, no fever/chills/cough, used otc meds without relief, sx for 4 days  O: vitals wnl, nad, left tm pink and dull, r tm dull, nasal mucosa grossly red and swollen on left, throat wnl, neck supple no lymph, lungs c t a, cv rrr  A: acute sinusitis, otitis media  P: amoxil 875mg  bid x 10d, prednisone 30mg  qd

## 2015-03-28 ENCOUNTER — Encounter: Payer: Self-pay | Admitting: Physician Assistant

## 2015-03-28 ENCOUNTER — Ambulatory Visit: Payer: Self-pay | Admitting: Physician Assistant

## 2015-03-28 VITALS — BP 138/84 | HR 80 | Temp 98.3°F

## 2015-03-28 DIAGNOSIS — H6982 Other specified disorders of Eustachian tube, left ear: Secondary | ICD-10-CM

## 2015-03-28 MED ORDER — PREDNISONE 10 MG PO TABS: 30.0000 mg | ORAL_TABLET | Freq: Every day | ORAL | Status: DC

## 2015-03-28 MED ORDER — FLUTICASONE PROPIONATE 50 MCG/ACT NA SUSP: 2.0000 | Freq: Every day | NASAL | Status: DC

## 2015-03-28 NOTE — Progress Notes (Signed)
S:  C/o ears popping and being stopped up, no drainage from ears, no fever/chills, no cough or congestion, some sinus pressure, throat is a little sore, remainder ros neg Using otc meds without relief  O:  Vitals wnl, nad, tms dull b/l, nasal mucosa swollen, throat wnl, neck supple no lymph, lungs c t a, cv rrr, neuro intact  A: acute eustachean tube dysfunction  P: flonase, sudafed, prednisone 30mg  qd x 3d, return if not improving in 3 to 5 days, return earlier if worsening

## 2015-05-26 ENCOUNTER — Encounter: Payer: Self-pay | Admitting: Family Medicine

## 2015-05-26 ENCOUNTER — Ambulatory Visit (INDEPENDENT_AMBULATORY_CARE_PROVIDER_SITE_OTHER): Payer: 59 | Admitting: Family Medicine

## 2015-05-26 VITALS — BP 125/80 | HR 75 | Temp 97.8°F | Ht 63.1 in | Wt 229.0 lb

## 2015-05-26 DIAGNOSIS — Z0000 Encounter for general adult medical examination without abnormal findings: Secondary | ICD-10-CM | POA: Diagnosis not present

## 2015-05-26 DIAGNOSIS — E785 Hyperlipidemia, unspecified: Secondary | ICD-10-CM

## 2015-05-26 NOTE — Addendum Note (Signed)
Addended by: Dorcas Carrow on: 05/26/2015 01:04 PM   Modules accepted: Orders, SmartSet

## 2015-05-26 NOTE — Patient Instructions (Signed)
Health Maintenance, Female Adopting a healthy lifestyle and getting preventive care can go a long way to promote health and wellness. Talk with your health care provider about what schedule of regular examinations is right for you. This is a good chance for you to check in with your provider about disease prevention and staying healthy. In between checkups, there are plenty of things you can do on your own. Experts have done a lot of research about which lifestyle changes and preventive measures are most likely to keep you healthy. Ask your health care provider for more information. WEIGHT AND DIET  Eat a healthy diet  Be sure to include plenty of vegetables, fruits, low-fat dairy products, and lean protein.  Do not eat a lot of foods high in solid fats, added sugars, or salt.  Get regular exercise. This is one of the most important things you can do for your health.  Most adults should exercise for at least 150 minutes each week. The exercise should increase your heart rate and make you sweat (moderate-intensity exercise).  Most adults should also do strengthening exercises at least twice a week. This is in addition to the moderate-intensity exercise.  Maintain a healthy weight  Body mass index (BMI) is a measurement that can be used to identify possible weight problems. It estimates body fat based on height and weight. Your health care provider can help determine your BMI and help you achieve or maintain a healthy weight.  For females 20 years of age and older:   A BMI below 18.5 is considered underweight.  A BMI of 18.5 to 24.9 is normal.  A BMI of 25 to 29.9 is considered overweight.  A BMI of 30 and above is considered obese.  Watch levels of cholesterol and blood lipids  You should start having your blood tested for lipids and cholesterol at 29 years of age, then have this test every 5 years.  You may need to have your cholesterol levels checked more often if:  Your lipid  or cholesterol levels are high.  You are older than 29 years of age.  You are at high risk for heart disease.  CANCER SCREENING   Lung Cancer  Lung cancer screening is recommended for adults 55-80 years old who are at high risk for lung cancer because of a history of smoking.  A yearly low-dose CT scan of the lungs is recommended for people who:  Currently smoke.  Have quit within the past 15 years.  Have at least a 30-pack-year history of smoking. A pack year is smoking an average of one pack of cigarettes a day for 1 year.  Yearly screening should continue until it has been 15 years since you quit.  Yearly screening should stop if you develop a health problem that would prevent you from having lung cancer treatment.  Breast Cancer  Practice breast self-awareness. This means understanding how your breasts normally appear and feel.  It also means doing regular breast self-exams. Let your health care provider know about any changes, no matter how small.  If you are in your 20s or 30s, you should have a clinical breast exam (CBE) by a health care provider every 1-3 years as part of a regular health exam.  If you are 40 or older, have a CBE every year. Also consider having a breast X-ray (mammogram) every year.  If you have a family history of breast cancer, talk to your health care provider about genetic screening.  If you   are at high risk for breast cancer, talk to your health care provider about having an MRI and a mammogram every year.  Breast cancer gene (BRCA) assessment is recommended for women who have family members with BRCA-related cancers. BRCA-related cancers include:  Breast.  Ovarian.  Tubal.  Peritoneal cancers.  Results of the assessment will determine the need for genetic counseling and BRCA1 and BRCA2 testing. Cervical Cancer Your health care provider may recommend that you be screened regularly for cancer of the pelvic organs (ovaries, uterus, and  vagina). This screening involves a pelvic examination, including checking for microscopic changes to the surface of your cervix (Pap test). You may be encouraged to have this screening done every 3 years, beginning at age 21.  For women ages 30-65, health care providers may recommend pelvic exams and Pap testing every 3 years, or they may recommend the Pap and pelvic exam, combined with testing for human papilloma virus (HPV), every 5 years. Some types of HPV increase your risk of cervical cancer. Testing for HPV may also be done on women of any age with unclear Pap test results.  Other health care providers may not recommend any screening for nonpregnant women who are considered low risk for pelvic cancer and who do not have symptoms. Ask your health care provider if a screening pelvic exam is right for you.  If you have had past treatment for cervical cancer or a condition that could lead to cancer, you need Pap tests and screening for cancer for at least 20 years after your treatment. If Pap tests have been discontinued, your risk factors (such as having a new sexual partner) need to be reassessed to determine if screening should resume. Some women have medical problems that increase the chance of getting cervical cancer. In these cases, your health care provider may recommend more frequent screening and Pap tests. Colorectal Cancer  This type of cancer can be detected and often prevented.  Routine colorectal cancer screening usually begins at 29 years of age and continues through 29 years of age.  Your health care provider may recommend screening at an earlier age if you have risk factors for colon cancer.  Your health care provider may also recommend using home test kits to check for hidden blood in the stool.  A small camera at the end of a tube can be used to examine your colon directly (sigmoidoscopy or colonoscopy). This is done to check for the earliest forms of colorectal  cancer.  Routine screening usually begins at age 50.  Direct examination of the colon should be repeated every 5-10 years through 29 years of age. However, you may need to be screened more often if early forms of precancerous polyps or small growths are found. Skin Cancer  Check your skin from head to toe regularly.  Tell your health care provider about any new moles or changes in moles, especially if there is a change in a mole's shape or color.  Also tell your health care provider if you have a mole that is larger than the size of a pencil eraser.  Always use sunscreen. Apply sunscreen liberally and repeatedly throughout the day.  Protect yourself by wearing long sleeves, pants, a wide-brimmed hat, and sunglasses whenever you are outside. HEART DISEASE, DIABETES, AND HIGH BLOOD PRESSURE   High blood pressure causes heart disease and increases the risk of stroke. High blood pressure is more likely to develop in:  People who have blood pressure in the high end   of the normal range (130-139/85-89 mm Hg).  People who are overweight or obese.  People who are African American.  If you are 38-23 years of age, have your blood pressure checked every 3-5 years. If you are 61 years of age or older, have your blood pressure checked every year. You should have your blood pressure measured twice--once when you are at a hospital or clinic, and once when you are not at a hospital or clinic. Record the average of the two measurements. To check your blood pressure when you are not at a hospital or clinic, you can use:  An automated blood pressure machine at a pharmacy.  A home blood pressure monitor.  If you are between 45 years and 39 years old, ask your health care provider if you should take aspirin to prevent strokes.  Have regular diabetes screenings. This involves taking a blood sample to check your fasting blood sugar level.  If you are at a normal weight and have a low risk for diabetes,  have this test once every three years after 29 years of age.  If you are overweight and have a high risk for diabetes, consider being tested at a younger age or more often. PREVENTING INFECTION  Hepatitis B  If you have a higher risk for hepatitis B, you should be screened for this virus. You are considered at high risk for hepatitis B if:  You were born in a country where hepatitis B is common. Ask your health care provider which countries are considered high risk.  Your parents were born in a high-risk country, and you have not been immunized against hepatitis B (hepatitis B vaccine).  You have HIV or AIDS.  You use needles to inject street drugs.  You live with someone who has hepatitis B.  You have had sex with someone who has hepatitis B.  You get hemodialysis treatment.  You take certain medicines for conditions, including cancer, organ transplantation, and autoimmune conditions. Hepatitis C  Blood testing is recommended for:  Everyone born from [birth year] through 1964.  Anyone with known risk factors for hepatitis C. Sexually transmitted infections (STIs)  You should be screened for sexually transmitted infections (STIs) including gonorrhea and chlamydia if:  You are sexually active and are younger than 29 years of age.  You are older than 29 years of age and your health care provider tells you that you are at risk for this type of infection.  Your sexual activity has changed since you were last screened and you are at an increased risk for chlamydia or gonorrhea. Ask your health care provider if you are at risk.  If you do not have HIV, but are at risk, it may be recommended that you take a prescription medicine daily to prevent HIV infection. This is called pre-exposure prophylaxis (PrEP). You are considered at risk if:  You are sexually active and do not regularly use condoms or know the HIV status of your partner(s).  You take drugs by injection.  You are sexually  active with a partner who has HIV. Talk with your health care provider about whether you are at high risk of being infected with HIV. If you choose to begin PrEP, you should first be tested for HIV. You should then be tested every 3 months for as long as you are taking PrEP.  PREGNANCY   If you are premenopausal and you may become pregnant, ask your health care provider about preconception counseling.  If you may  become pregnant, take 400 to 800 micrograms (mcg) of folic acid every day.  If you want to prevent pregnancy, talk to your health care provider about birth control (contraception). OSTEOPOROSIS AND MENOPAUSE   Osteoporosis is a disease in which the bones lose minerals and strength with aging. This can result in serious bone fractures. Your risk for osteoporosis can be identified using a bone density scan.  If you are 61 years of age or older, or if you are at risk for osteoporosis and fractures, ask your health care provider if you should be screened.  Ask your health care provider whether you should take a calcium or vitamin D supplement to lower your risk for osteoporosis.  Menopause may have certain physical symptoms and risks.  Hormone replacement therapy may reduce some of these symptoms and risks. Talk to your health care provider about whether hormone replacement therapy is right for you.  HOME CARE INSTRUCTIONS   Schedule regular health, dental, and eye exams.  Stay current with your immunizations.   Do not use any tobacco products including cigarettes, chewing tobacco, or electronic cigarettes.  If you are pregnant, do not drink alcohol.  If you are breastfeeding, limit how much and how often you drink alcohol.  Limit alcohol intake to no more than 1 drink per day for nonpregnant women. One drink equals 12 ounces of beer, 5 ounces of wine, or 1 ounces of hard liquor.  Do not use street drugs.  Do not share needles.  Ask your health care provider for help if  you need support or information about quitting drugs.  Tell your health care provider if you often feel depressed.  Tell your health care provider if you have ever been abused or do not feel safe at home.   This information is not intended to replace advice given to you by your health care provider. Make sure you discuss any questions you have with your health care provider.   Document Released: 10/19/2010 Document Revised: 04/26/2014 Document Reviewed: 03/07/2013 Elsevier Interactive Patient Education [employer].

## 2015-05-26 NOTE — Progress Notes (Signed)
BP 125/80 mmHg  Pulse 75  Temp(Src) 97.8 F (36.6 C)  Ht 5' 3.1" (1.603 m)  Wt 229 lb (103.874 kg)  BMI 40.42 kg/m2  SpO2 98%  LMP 04/28/2015 (Exact Date)   Subjective:    Patient ID: Cynthia George, female    DOB: 1985/06/18, 30 y.o.   MRN: [id number]  HPI: Cynthia George is a 30 y.o. female presenting on 05/26/2015 for comprehensive medical examination. Current medical complaints include: Has been under a lot of stress recently. Is planning a wedding. Has not been going to see her counselor, has just been due to the wedding. Has Rx for her provera to take every 3 months.   She currently lives with: alone Menopausal Symptoms: no  Depression Screen done today and results listed below:  Depression screen PHQ 2/9 05/26/2015  Decreased Interest 1  Down, Depressed, Hopeless 1  PHQ - 2 Score 2   Past Medical History:  Past Medical History  Diagnosis Date  . Migraines   . Anxiety   . PCOS (polycystic ovarian syndrome)   . Hyperlipidemia   . Depression     Surgical History:  Past Surgical History  Procedure Laterality Date  . Appendectomy  2013  . Tonsillectomy and adenoidectomy  2004  . Humerus fracture surgery Left 2002,2003    rod and screws placed and removed  . Wisdom tooth extraction  2011    All    Medications:  Current Outpatient Prescriptions on File Prior to Visit  Medication Sig  . aspirin-acetaminophen-caffeine (EXCEDRIN MIGRAINE) 250-250-65 MG tablet Take by mouth every 6 (six) hours as needed for headache.  . fluticasone (FLONASE) 50 MCG/ACT nasal spray Place 2 sprays into both nostrils daily.  . medroxyPROGESTERone (PROVERA) 10 MG tablet Take 10 mg by mouth daily. Patient is taking this for 10 days to start a period.   No current facility-administered medications on file prior to visit.    Allergies:  Allergies  Allergen Reactions  . Flagyl [Metronidazole] Rash  . Keflex [Cephalexin] Rash   Social History:  Social History   Social History  .  Marital Status: Married    Spouse Name: N/A  . Number of Children: N/A  . Years of Education: N/A   Occupational History  . Not on file.   Social History Main Topics  . Smoking status: Never Smoker   . Smokeless tobacco: Never Used  . Alcohol Use: No  . Drug Use: No  . Sexual Activity: Yes    Birth Control/ Protection: None   Other Topics Concern  . Not on file   Social History Narrative   History  Smoking status  . Never Smoker   Smokeless tobacco  . Never Used   History  Alcohol Use No   Family History:  Family History  Problem Relation Age of Onset  . Heart disease Mother   . Heart attack Mother   . Hypertension Mother   . Hyperlipidemia Mother   . Hyperlipidemia Father   . Hypertension Father   . Heart disease Father   . Diabetes Father   . Asthma Father   . Hyperlipidemia Sister   . Asthma Sister   . Polycystic ovary syndrome Sister   . Mental illness Sister     Depression  . Mental illness Brother     Depression  . Diabetes Maternal Grandmother   . Hyperlipidemia Maternal Grandmother   . Hypertension Maternal Grandmother   . Diabetes Maternal Grandfather   . Diabetes Paternal Grandmother   .  Heart attack Paternal Grandfather     Past medical history, surgical history, medications, allergies, family history and social history reviewed with patient today and changes made to appropriate areas of the chart.   Review of Systems  Constitutional: Negative.   HENT: Positive for ear pain (In November- resolved now). Negative for congestion, ear discharge, hearing loss, nosebleeds, sore throat and tinnitus.   Eyes: Negative.   Respiratory: Negative.  Negative for stridor.   Cardiovascular: Negative.   Gastrointestinal: Negative.   Genitourinary: Negative.   Musculoskeletal: Negative.   Skin: Negative.   Neurological: Negative.  Negative for headaches.  Endo/Heme/Allergies: Negative.   Psychiatric/Behavioral: Negative for depression, suicidal ideas,  hallucinations, memory loss and substance abuse. The patient is nervous/anxious. The patient does not have insomnia.     All other ROS negative except what is listed above and in the HPI.      Objective:    BP 125/80 mmHg  Pulse 75  Temp(Src) 97.8 F (36.6 C)  Ht 5' 3.1" (1.603 m)  Wt 229 lb (103.874 kg)  BMI 40.42 kg/m2  SpO2 98%  LMP 04/28/2015 (Exact Date)  Wt Readings from Last 3 Encounters:  05/26/15 229 lb (103.874 kg)  01/29/15 225 lb (102.059 kg)  01/24/15 225 lb 11.2 oz (102.377 kg)    Physical Exam  Constitutional: She is oriented to person, place, and time. She appears well-developed and well-nourished. No distress.  HENT:  Head: Normocephalic and atraumatic.  Right Ear: Hearing, tympanic membrane, external ear and ear canal normal.  Left Ear: Hearing, tympanic membrane, external ear and ear canal normal.  Nose: Nose normal.  Mouth/Throat: Uvula is midline, oropharynx is clear and moist and mucous membranes are normal. No oropharyngeal exudate.  Eyes: Conjunctivae, EOM and lids are normal. Pupils are equal, round, and reactive to light. Right eye exhibits no discharge. Left eye exhibits no discharge. No scleral icterus.  Neck: Normal range of motion. Neck supple. No JVD present. No tracheal deviation present. No thyromegaly present.  Cardiovascular: Normal rate, regular rhythm, normal heart sounds and intact distal pulses.  Exam reveals no gallop and no friction rub.   No murmur heard. Pulmonary/Chest: Effort normal and breath sounds normal. No stridor. No respiratory distress. She has no wheezes. She has no rales. She exhibits no tenderness.  Abdominal: Soft. Bowel sounds are normal. She exhibits no distension and no mass. There is no tenderness. There is no rebound and no guarding.  Genitourinary:  Breast and GYN exams deferred, done at GYN  Musculoskeletal: Normal range of motion. She exhibits no edema or tenderness.  Lymphadenopathy:    She has cervical  adenopathy.  Neurological: She is alert and oriented to person, place, and time. She has normal reflexes. She displays normal reflexes. No cranial nerve deficit. She exhibits normal muscle tone. Coordination normal.  Skin: Skin is warm, dry and intact. No rash noted. She is not diaphoretic. No erythema. No pallor.  Psychiatric: She has a normal mood and affect. Her speech is normal and behavior is normal. Judgment and thought content normal. Cognition and memory are normal.  Nursing note and vitals reviewed.   Results for orders placed or performed in visit on 01/24/15  TSH  Result Value Ref Range   TSH 3.480 0.450 - 4.500 uIU/mL  CBC with Differential/Platelet  Result Value Ref Range   WBC 5.7 3.4 - 10.8 x10E3/uL   RBC 4.52 3.77 - 5.28 x10E6/uL   Hemoglobin 12.7 11.1 - 15.9 g/dL   Hematocrit 16.1 09.6 -  46.6 %   MCV 85 79 - 97 fL   MCH 28.1 26.6 - 33.0 pg   MCHC 32.9 31.5 - 35.7 g/dL   RDW 16.1 09.6 - 04.5 %   Platelets 332 150 - 379 x10E3/uL   Neutrophils 60 %   Lymphs 27 %   Monocytes 8 %   Eos 4 %   Basos 1 %   Neutrophils Absolute 3.5 1.4 - 7.0 x10E3/uL   Lymphocytes Absolute 1.5 0.7 - 3.1 x10E3/uL   Monocytes Absolute 0.4 0.1 - 0.9 x10E3/uL   EOS (ABSOLUTE) 0.2 0.0 - 0.4 x10E3/uL   Basophils Absolute 0.1 0.0 - 0.2 x10E3/uL   Immature Granulocytes 0 %   Immature Grans (Abs) 0.0 0.0 - 0.1 x10E3/uL  Bayer DCA Hb A1c Waived  Result Value Ref Range   Bayer DCA Hb A1c Waived 5.1 <7.0 %  Comprehensive metabolic panel  Result Value Ref Range   Glucose 85 65 - 99 mg/dL   BUN 11 6 - 20 mg/dL   Creatinine, Ser 4.09 0.57 - 1.00 mg/dL   GFR calc non Af Amer 123 >59 mL/min/1.73   GFR calc Af Amer 142 >59 mL/min/1.73   BUN/Creatinine Ratio 18 8 - 20   Sodium 140 134 - 144 mmol/L   Potassium 4.8 3.5 - 5.2 mmol/L   Chloride 99 97 - 108 mmol/L   CO2 24 18 - 29 mmol/L   Calcium 9.3 8.7 - 10.2 mg/dL   Total Protein 7.0 6.0 - 8.5 g/dL   Albumin 4.3 3.5 - 5.5 g/dL   Globulin,  Total 2.7 1.5 - 4.5 g/dL   Albumin/Globulin Ratio 1.6 1.1 - 2.5   Bilirubin Total 0.9 0.0 - 1.2 mg/dL   Alkaline Phosphatase 58 39 - 117 IU/L   AST 19 0 - 40 IU/L   ALT 38 (H) 0 - 32 IU/L  Lipid Panel Piccolo, Waived  Result Value Ref Range   Cholesterol Piccolo, Waived 238 (H) <200 mg/dL   HDL Chol Piccolo, Waived 28 (L) >59 mg/dL   Triglycerides Piccolo,Waived 350 (H) <150 mg/dL   Chol/HDL Ratio Piccolo,Waive 8.6 (H) mg/dL   LDL Chol Calc Piccolo Waived 140 (H) <100 mg/dL   VLDL Chol Calc Piccolo,Waive 70 (H) <30 mg/dL      Assessment & Plan:   Problem List Items Addressed This Visit      Other   Hyperlipidemia    Rechecking levels again today, lipemic. Await results. Discussed diet and exercise.       Relevant Orders   LP+ALT+AST Piccolo, [place]    Other Visit Diagnoses    Routine general medical examination at a health care facility    -  Primary    Up to date on pap and vaccines. Up to date on screening labs. Discussed diet and exercise. Continue to monitor. Recheck 6 months.         Follow up plan: Return in about 6 months (around 11/23/2015) for follow up PCOS/HLD.   LABORATORY TESTING:  - Pap smear: done elsewhere, and up to date  IMMUNIZATIONS:   - Tdap: Tetanus vaccination status reviewed: last tetanus booster within 10 years. - Influenza: Up to date - Pneumovax: Not applicable  PATIENT COUNSELING:   Advised to take 1 mg of folate supplement per day if capable of pregnancy.   Sexuality: Discussed sexually transmitted diseases, partner selection, use of condoms, avoidance of unintended pregnancy  and contraceptive alternatives.   Advised to avoid cigarette smoking.  I discussed with the  patient that most people either abstain from alcohol or drink within safe limits (<=14/week and <=4 drinks/occasion for males, <=7/weeks and <= 3 drinks/occasion for females) and that the risk for alcohol disorders and other health effects rises proportionally with the  number of drinks per week and how often a drinker exceeds daily limits.  Discussed cessation/primary prevention of drug use and availability of treatment for abuse.   Diet: Encouraged to adjust caloric intake to maintain  or achieve ideal body weight, to reduce intake of dietary saturated fat and total fat, to limit sodium intake by avoiding high sodium foods and not adding table salt, and to maintain adequate dietary potassium and calcium preferably from fresh fruits, vegetables, and low-fat dairy products.    stressed the importance of regular exercise  Injury prevention: Discussed safety belts, safety helmets, smoke detector, smoking near bedding or upholstery.   Dental health: Discussed importance of regular tooth brushing, flossing, and dental visits.    NEXT PREVENTATIVE PHYSICAL DUE IN 1 YEAR. Return in about 6 months (around 11/23/2015) for follow up PCOS/HLD.

## 2015-05-26 NOTE — Assessment & Plan Note (Signed)
Rechecking levels again today, lipemic. Await results. Discussed diet and exercise.

## 2015-05-27 ENCOUNTER — Telehealth: Payer: Self-pay | Admitting: Family Medicine

## 2015-05-27 LAB — LIPID PANEL W/O CHOL/HDL RATIO
Cholesterol, Total: 233 mg/dL — ABNORMAL HIGH (ref 100–199)
HDL: 21 mg/dL — ABNORMAL LOW (ref >39)
Triglycerides: 482 mg/dL — ABNORMAL HIGH (ref 0–149)

## 2015-05-27 NOTE — Telephone Encounter (Signed)
Left message for patient to return my call.

## 2015-05-27 NOTE — Telephone Encounter (Signed)
Please let Cynthia George know that her cholesterol came back borderline, but her triglycerides were very high. If she was fasting, we should start some medicine, if she had eaten, we'll need her to come in for a repeat fasting whenever she can. Thanks!

## 2015-05-29 NOTE — Telephone Encounter (Signed)
She can definitely try diet and exercise.

## 2015-05-29 NOTE — Telephone Encounter (Signed)
Patient had not eating since the night before at 11pm, she would like to know if she can try fish oil and diet and exercise before she starts a medication.

## 2015-05-29 NOTE — Telephone Encounter (Signed)
Patient notified

## 2015-10-29 DIAGNOSIS — E282 Polycystic ovarian syndrome: Secondary | ICD-10-CM | POA: Diagnosis not present

## 2015-10-29 DIAGNOSIS — N970 Female infertility associated with anovulation: Secondary | ICD-10-CM | POA: Diagnosis not present

## 2015-10-29 DIAGNOSIS — Z6837 Body mass index (BMI) 37.0-37.9, adult: Secondary | ICD-10-CM | POA: Diagnosis not present

## 2015-10-29 DIAGNOSIS — Z6839 Body mass index (BMI) 39.0-39.9, adult: Secondary | ICD-10-CM | POA: Diagnosis not present

## 2015-10-29 DIAGNOSIS — E669 Obesity, unspecified: Secondary | ICD-10-CM | POA: Diagnosis not present

## 2015-11-25 ENCOUNTER — Ambulatory Visit (INDEPENDENT_AMBULATORY_CARE_PROVIDER_SITE_OTHER): Payer: 59 | Admitting: Family Medicine

## 2015-11-25 ENCOUNTER — Other Ambulatory Visit: Payer: Self-pay | Admitting: Family Medicine

## 2015-11-25 ENCOUNTER — Encounter: Payer: Self-pay | Admitting: Family Medicine

## 2015-11-25 VITALS — BP 102/69 | HR 66 | Temp 98.4°F | Ht 63.2 in | Wt 225.0 lb

## 2015-11-25 DIAGNOSIS — E282 Polycystic ovarian syndrome: Secondary | ICD-10-CM

## 2015-11-25 DIAGNOSIS — E785 Hyperlipidemia, unspecified: Secondary | ICD-10-CM

## 2015-11-25 LAB — LIPID PANEL PICCOLO, WAIVED
Chol/HDL Ratio Piccolo,Waive: 8.9 mg/dL — ABNORMAL HIGH
Cholesterol Piccolo, Waived: 231 mg/dL — ABNORMAL HIGH (ref <200)
HDL Chol Piccolo, Waived: 26 mg/dL — ABNORMAL LOW (ref >59)
LDL Chol Calc Piccolo Waived: 128 mg/dL — ABNORMAL HIGH (ref <100)
TRIGLYCERIDES PICCOLO,WAIVED: 385 mg/dL — AB (ref <150)
VLDL CHOL CALC PICCOLO,WAIVE: 77 mg/dL — AB (ref <30)

## 2015-11-25 NOTE — Assessment & Plan Note (Addendum)
Improved, but still high. Has been working on diet and exercise, We have a low threshold for starting statin considering high prevalence of early MI on both sides of her family (Mom 39, 27Dad 2842, PGF 1342), however, she is planning on becoming pregnant, so will hold on cholesterol medicine until after pregnancy.

## 2015-11-25 NOTE — Progress Notes (Signed)
BP 102/69 (BP Location: Right Arm, Patient Position: Sitting, Cuff Size: Large)   Pulse 66   Temp 98.4 F (36.9 C)   Ht 5' 3.2" (1.605 m)   Wt 225 lb (102.1 kg)   LMP 09/13/2015   SpO2 97%   BMI 39.61 kg/m    Subjective:    Patient ID: Cynthia George, female    DOB: 10/22/1985, 30 y.o.   MRN: [id number]  HPI: Cynthia George is a 30 y.o. female  Chief Complaint  Patient presents with  . Hyperlipidemia  . PCOS   Following with Dr. Bonney Aid for PCOS- planning on getting pregnant   HYPERLIPIDEMIA- triglycerides quite elevated at last visit. HDL low and cholesterol elevated. Has been working on diet and exercise Hyperlipidemia status: Has been working on diet and exercise.  Satisfied with current treatment?  yes Side effects:  no Medication compliance: not on anything Past cholesterol meds: none Supplements: none Aspirin:  no Chest pain:  no Coronary artery disease:  no Family history CAD:  yes Family history early CAD:  yes  Relevant past medical, surgical, family and social history reviewed and updated as indicated. Interim medical history since our last visit reviewed. Allergies and medications reviewed and updated.  Review of Systems  Constitutional: Negative.   Respiratory: Negative.   Cardiovascular: Negative.   Psychiatric/Behavioral: Negative for agitation, behavioral problems, confusion, decreased concentration, dysphoric mood, hallucinations, self-injury, sleep disturbance and suicidal ideas. The patient is nervous/anxious. The patient is not hyperactive.     Per HPI unless specifically indicated above     Objective:    BP 102/69 (BP Location: Right Arm, Patient Position: Sitting, Cuff Size: Large)   Pulse 66   Temp 98.4 F (36.9 C)   Ht 5' 3.2" (1.605 m)   Wt 225 lb (102.1 kg)   LMP 09/13/2015   SpO2 97%   BMI 39.61 kg/m   Wt Readings from Last 3 Encounters:  11/25/15 225 lb (102.1 kg)  10/16/13 218 lb (98.9 kg)  05/26/15 229 lb (103.9 kg)     Physical Exam  Constitutional: She is oriented to person, place, and time. She appears well-developed and well-nourished. No distress.  HENT:  Head: Normocephalic and atraumatic.  Right Ear: Hearing normal.  Left Ear: Hearing normal.  Nose: Nose normal.  Eyes: Conjunctivae and lids are normal. Right eye exhibits no discharge. Left eye exhibits no discharge. No scleral icterus.  Cardiovascular: Normal rate, regular rhythm, normal heart sounds and intact distal pulses.  Exam reveals no gallop and no friction rub.   No murmur heard. Pulmonary/Chest: Effort normal and breath sounds normal. No respiratory distress. She has no wheezes. She has no rales. She exhibits no tenderness.  Musculoskeletal: Normal range of motion.  Neurological: She is alert and oriented to person, place, and time.  Skin: Skin is warm, dry and intact. No rash noted. No erythema. No pallor.  Psychiatric: She has a normal mood and affect. Her speech is normal and behavior is normal. Judgment and thought content normal. Cognition and memory are normal.  Nursing note and vitals reviewed.   Results for orders placed or performed in visit on 10/22/15  CBC and differential  Result Value Ref Range   Hemoglobin 13.1 12.0 - 16.0 g/dL   HCT 39 36 - 46 %   Platelets 342 150 - 399 K/L  Basic metabolic panel  Result Value Ref Range   Glucose 78 mg/dL   Creatinine 0.6 0.5 - 1.1 mg/dL  Lipid panel  Result Value Ref Range   Cholesterol 220 (A) 0 - 200 mg/dL   HDL 25 (A) 35 - 70 mg/dL   LDL Cholesterol 130138 mg/dL  Hepatic function panel  Result Value Ref Range   Bilirubin, Total 0.5 mg/dL  Hemoglobin Q6VA1c  Result Value Ref Range   Hemoglobin A1C 5.3   TSH  Result Value Ref Range   TSH 4.21 0.41 - 5.90 uIU/mL      Assessment & Plan:   Problem List Items Addressed This Visit      Endocrine   PCOS (polycystic ovarian syndrome)    Continue to follow with Dr. Bonney AidStaebler. Planning on becoming pregnant. Continue to  monitor.         Other   Hyperlipidemia - Primary    Improved, but still high. Has been working on diet and exercise, We have a low threshold for starting statin considering high prevalence of early MI on both sides of her family (Mom 7539, Dad 5342, PGF 6142), however, she is planning on becoming pregnant, so will hold on cholesterol medicine until after pregnancy.       Other Visit Diagnoses   None.      Follow up plan: Return in about 6 months (around 05/27/2016) for Physical.

## 2015-11-25 NOTE — Assessment & Plan Note (Signed)
Continue to follow with Dr. Bonney AidStaebler. Planning on becoming pregnant. Continue to monitor.

## 2015-11-28 DIAGNOSIS — N970 Female infertility associated with anovulation: Secondary | ICD-10-CM | POA: Diagnosis not present

## 2015-12-01 DIAGNOSIS — E282 Polycystic ovarian syndrome: Secondary | ICD-10-CM | POA: Diagnosis not present

## 2015-12-18 DIAGNOSIS — E282 Polycystic ovarian syndrome: Secondary | ICD-10-CM | POA: Diagnosis not present

## 2016-02-04 DIAGNOSIS — E282 Polycystic ovarian syndrome: Secondary | ICD-10-CM | POA: Diagnosis not present

## 2016-03-22 DIAGNOSIS — N970 Female infertility associated with anovulation: Secondary | ICD-10-CM | POA: Diagnosis not present

## 2016-03-22 DIAGNOSIS — R946 Abnormal results of thyroid function studies: Secondary | ICD-10-CM | POA: Diagnosis not present

## 2016-04-19 NOTE — L&D Delivery Note (Signed)
Delivery Note At 1:03 AM a viable female infant was delivered via Vaginal, Spontaneous Delivery (Presentation: LOA, but head rotated across midline with shoulders aligned in left shoulder anterior).  APGAR: 3, 9; weight 7 lb 4.8 oz (3310 g).   Placenta status: delivered spontaneously, intact.  Cord: 3VC  with the following complications: None.  Cord pH: n/a  Anesthesia: epidural  Episiotomy: None Lacerations: 1st degree;Vaginal Suture Repair: n/a Est. Blood Loss (mL): 400  Mom to postpartum.  Baby to Couplet care / Skin to Skin.  Called to see patient.  Mom pushed to deliver a viable female infant.  The head remained at the perineum so a second nurse was called to the room.  Intermittent heart rate checks showed 80s-90s, then 110s.  Ritgen performed to deliver chin.  Head rotated to align with shoulders. Gentle downward traction placed without much moved. Shoulder sweep performed, but shoulder noted to be past pubic bone. Gentle upward traction and posterior shoulder delivered easily.  Abdomen and arms were delayed slightly in delivery, then rapid delivery of rest of body.  No nuchal cord noted.  Baby to mom's chest.  Cord clamped and cut quickly due to stunned appearance of baby.  Cord segment obtained just in case needed.  No cord blood obtained.  Placenta delivered spontaneously, intact, with a 3-vessel cord.  First degree vaginal laceration noted but was more of an abrasion and was hemostatic.  So, no repair performed.  All counts correct.  Hemostasis obtained with IV pitocin and fundal massage. EBL 400 mL. Pediatric assessment performed and all limbs moving normally.       Thomasene MohairStephen Isabella Ida, MD 11/20/2016, 1:29 AM

## 2016-04-30 DIAGNOSIS — Z113 Encounter for screening for infections with a predominantly sexual mode of transmission: Secondary | ICD-10-CM | POA: Diagnosis not present

## 2016-04-30 DIAGNOSIS — Z3401 Encounter for supervision of normal first pregnancy, first trimester: Secondary | ICD-10-CM | POA: Diagnosis not present

## 2016-04-30 LAB — OB RESULTS CONSOLE HGB/HCT, BLOOD
HEMATOCRIT: 37 %
Hemoglobin: 12.4 g/dL

## 2016-04-30 LAB — OB RESULTS CONSOLE GC/CHLAMYDIA
CHLAMYDIA, DNA PROBE: NEGATIVE
Gonorrhea: NEGATIVE

## 2016-04-30 LAB — OB RESULTS CONSOLE ABO/RH: RH TYPE: POSITIVE

## 2016-04-30 LAB — OB RESULTS CONSOLE ANTIBODY SCREEN: Antibody Screen: NEGATIVE

## 2016-04-30 LAB — OB RESULTS CONSOLE VARICELLA ZOSTER ANTIBODY, IGG: Varicella: IMMUNE

## 2016-04-30 LAB — OB RESULTS CONSOLE HIV ANTIBODY (ROUTINE TESTING): HIV: NONREACTIVE

## 2016-04-30 LAB — OB RESULTS CONSOLE RUBELLA ANTIBODY, IGM: RUBELLA: IMMUNE

## 2016-04-30 LAB — OB RESULTS CONSOLE PLATELET COUNT: Platelets: 304 10*3/uL

## 2016-04-30 LAB — OB RESULTS CONSOLE HEPATITIS B SURFACE ANTIGEN: HEP B S AG: NEGATIVE

## 2016-04-30 LAB — OB RESULTS CONSOLE RPR: RPR: NONREACTIVE

## 2016-05-03 DIAGNOSIS — Z124 Encounter for screening for malignant neoplasm of cervix: Secondary | ICD-10-CM | POA: Diagnosis not present

## 2016-05-03 DIAGNOSIS — Z3401 Encounter for supervision of normal first pregnancy, first trimester: Secondary | ICD-10-CM | POA: Diagnosis not present

## 2016-05-03 DIAGNOSIS — O0991 Supervision of high risk pregnancy, unspecified, first trimester: Secondary | ICD-10-CM | POA: Diagnosis not present

## 2016-05-03 DIAGNOSIS — Z113 Encounter for screening for infections with a predominantly sexual mode of transmission: Secondary | ICD-10-CM | POA: Diagnosis not present

## 2016-05-07 DIAGNOSIS — Z3A09 9 weeks gestation of pregnancy: Secondary | ICD-10-CM | POA: Diagnosis not present

## 2016-05-07 DIAGNOSIS — O99211 Obesity complicating pregnancy, first trimester: Secondary | ICD-10-CM | POA: Diagnosis not present

## 2016-05-07 DIAGNOSIS — Z3401 Encounter for supervision of normal first pregnancy, first trimester: Secondary | ICD-10-CM | POA: Diagnosis not present

## 2016-05-07 DIAGNOSIS — Z3687 Encounter for antenatal screening for uncertain dates: Secondary | ICD-10-CM | POA: Diagnosis not present

## 2016-05-07 DIAGNOSIS — O0991 Supervision of high risk pregnancy, unspecified, first trimester: Secondary | ICD-10-CM | POA: Diagnosis not present

## 2016-05-07 DIAGNOSIS — Z113 Encounter for screening for infections with a predominantly sexual mode of transmission: Secondary | ICD-10-CM | POA: Diagnosis not present

## 2016-05-07 DIAGNOSIS — Z6841 Body Mass Index (BMI) 40.0 and over, adult: Secondary | ICD-10-CM | POA: Diagnosis not present

## 2016-05-07 DIAGNOSIS — E669 Obesity, unspecified: Secondary | ICD-10-CM | POA: Diagnosis not present

## 2016-05-07 DIAGNOSIS — Z124 Encounter for screening for malignant neoplasm of cervix: Secondary | ICD-10-CM | POA: Diagnosis not present

## 2016-05-19 DIAGNOSIS — R740 Nonspecific elevation of levels of transaminase and lactic acid dehydrogenase [LDH]: Secondary | ICD-10-CM | POA: Diagnosis not present

## 2016-05-19 DIAGNOSIS — R946 Abnormal results of thyroid function studies: Secondary | ICD-10-CM | POA: Diagnosis not present

## 2016-05-19 DIAGNOSIS — R7302 Impaired glucose tolerance (oral): Secondary | ICD-10-CM | POA: Diagnosis not present

## 2016-05-27 ENCOUNTER — Encounter: Payer: Self-pay | Admitting: Family Medicine

## 2016-05-27 ENCOUNTER — Ambulatory Visit (INDEPENDENT_AMBULATORY_CARE_PROVIDER_SITE_OTHER): Payer: 59 | Admitting: Family Medicine

## 2016-05-27 VITALS — BP 105/65 | HR 73 | Temp 98.4°F | Ht 63.0 in | Wt 234.6 lb

## 2016-05-27 DIAGNOSIS — R7989 Other specified abnormal findings of blood chemistry: Secondary | ICD-10-CM | POA: Diagnosis not present

## 2016-05-27 DIAGNOSIS — R945 Abnormal results of liver function studies: Secondary | ICD-10-CM

## 2016-05-27 DIAGNOSIS — E282 Polycystic ovarian syndrome: Secondary | ICD-10-CM | POA: Diagnosis not present

## 2016-05-27 DIAGNOSIS — E782 Mixed hyperlipidemia: Secondary | ICD-10-CM | POA: Diagnosis not present

## 2016-05-27 DIAGNOSIS — Z0000 Encounter for general adult medical examination without abnormal findings: Secondary | ICD-10-CM | POA: Diagnosis not present

## 2016-05-27 LAB — UA/M W/RFLX CULTURE, ROUTINE
BILIRUBIN UA: NEGATIVE
Glucose, UA: NEGATIVE
KETONES UA: NEGATIVE
Leukocytes, UA: NEGATIVE
NITRITE UA: NEGATIVE
Protein, UA: NEGATIVE
RBC, UA: NEGATIVE
Specific Gravity, UA: 1.02 (ref 1.005–1.030)
UUROB: 0.2 mg/dL (ref 0.2–1.0)
pH, UA: 5.5 (ref 5.0–7.5)

## 2016-05-27 NOTE — Assessment & Plan Note (Signed)
Not fasting today. Rechecking levels. Await results and treat as needed. Currently pregnant.

## 2016-05-27 NOTE — Patient Instructions (Addendum)

## 2016-05-27 NOTE — Progress Notes (Signed)
BP 105/65 (BP Location: Left Arm, Patient Position: Sitting, Cuff Size: Large)   Pulse 73   Temp 98.4 F (36.9 C)   Ht 5\' 3"  (1.6 m)   Wt 234 lb 9.6 oz (106.4 kg)   LMP 03/02/2016 (Exact Date)   SpO2 97%   BMI 41.56 kg/m    Subjective:    Patient ID: Cynthia George, female    DOB: Aug 30, 1985, 31 y.o.   MRN: [id number]  HPI: Cynthia George is a 31 y.o. female presenting on 05/27/2016 for comprehensive medical examination. Current medical complaints include: Currently pregnant. Just over [redacted] weeks pregnant. Has been very nauseous.   She currently lives with: husband Menopausal Symptoms: no  Depression Screen done today and results listed below:  Depression screen [facility] 2/9 05/27/2016 05/26/2015  Decreased Interest 0 1  Down, Depressed, Hopeless 0 1  PHQ - 2 Score 0 2  Altered sleeping 1 -  Tired, decreased energy 1 -  Change in appetite 0 -  Feeling bad or failure about yourself  0 -  Trouble concentrating 0 -  Moving slowly or fidgety/restless 0 -  Suicidal thoughts 0 -  PHQ-9 Score 2 -   Past Medical History:  Past Medical History:  Diagnosis Date  . Anxiety   . Depression   . Hyperlipidemia   . Migraines   . PCOS (polycystic ovarian syndrome)     Surgical History:  Past Surgical History:  Procedure Laterality Date  . APPENDECTOMY  2013  . HUMERUS FRACTURE SURGERY Left 2002,2003   rod and screws placed and removed  . TONSILLECTOMY AND ADENOIDECTOMY  2004  . WISDOM TOOTH EXTRACTION  2011   All    Medications:  No current outpatient prescriptions on file prior to visit.   No current facility-administered medications on file prior to visit.     Allergies:  Allergies  Allergen Reactions  . Flagyl [Metronidazole] Rash  . Keflex [Cephalexin] Rash  . Septra [Sulfamethoxazole-Trimethoprim] Rash    Social History:  Social History   Social History  . Marital status: Married    Spouse name: N/A  . Number of children: N/A  . Years of education: N/A    Occupational History  . Not on file.   Social History Main Topics  . Smoking status: Never Smoker  . Smokeless tobacco: Never Used  . Alcohol use No  . Drug use: No  . Sexual activity: Yes    Birth control/ protection: None   Other Topics Concern  . Not on file   Social History Narrative  . No narrative on file   History  Smoking Status  . Never Smoker  Smokeless Tobacco  . Never Used   History  Alcohol Use No    Family History:  Family History  Problem Relation Age of Onset  . Heart disease Mother   . Heart attack Mother   . Hypertension Mother   . Hyperlipidemia Mother   . Hyperlipidemia Father   . Hypertension Father   . Heart disease Father   . Diabetes Father   . Asthma Father   . Hyperlipidemia Sister   . Asthma Sister   . Polycystic ovary syndrome Sister   . Mental illness Sister     Depression  . Mental illness Brother     Depression  . Diabetes Maternal Grandmother   . Hyperlipidemia Maternal Grandmother   . Hypertension Maternal Grandmother   . Diabetes Maternal Grandfather   . Diabetes Paternal Grandmother   .  Heart attack Paternal Grandfather     Past medical history, surgical history, medications, allergies, family history and social history reviewed with patient today and changes made to appropriate areas of the chart.   Review of Systems  Constitutional: Negative.   HENT: Negative.   Eyes: Negative.   Respiratory: Negative.   Cardiovascular: Negative.   Gastrointestinal: Positive for nausea. Negative for abdominal pain, blood in stool, constipation, diarrhea, heartburn, melena and vomiting.  Genitourinary: Negative.   Musculoskeletal: Negative.   Skin: Negative.   Neurological: Positive for dizziness. Negative for tingling, tremors, sensory change, speech change, focal weakness, seizures, loss of consciousness and headaches.  Endo/Heme/Allergies: Positive for polydipsia. Negative for environmental allergies. Does not bruise/bleed  easily.  Psychiatric/Behavioral: Negative.    All other ROS negative except what is listed above and in the HPI.      Objective:    BP 105/65 (BP Location: Left Arm, Patient Position: Sitting, Cuff Size: Large)   Pulse 73   Temp 98.4 F (36.9 C)   Ht 5\' 3"  (1.6 m)   Wt 234 lb 9.6 oz (106.4 kg)   LMP 03/02/2016 (Exact Date)   SpO2 97%   BMI 41.56 kg/m   Wt Readings from Last 3 Encounters:  05/27/16 234 lb 9.6 oz (106.4 kg)  11/25/15 225 lb (102.1 kg)  10/16/13 218 lb (98.9 kg)    Physical Exam  Constitutional: She is oriented to person, place, and time. She appears well-developed and well-nourished. No distress.  HENT:  Head: Normocephalic and atraumatic.  Right Ear: Hearing and external ear normal.  Left Ear: Hearing and external ear normal.  Nose: Nose normal.  Mouth/Throat: Oropharynx is clear and moist. No oropharyngeal exudate.  Eyes: Conjunctivae, EOM and lids are normal. Pupils are equal, round, and reactive to light. Right eye exhibits no discharge. Left eye exhibits no discharge. No scleral icterus.  Neck: Normal range of motion. Neck supple. No JVD present. No tracheal deviation present. No thyromegaly present.  Cardiovascular: Normal rate, regular rhythm, normal heart sounds and intact distal pulses.  Exam reveals no gallop and no friction rub.   No murmur heard. Pulmonary/Chest: Effort normal and breath sounds normal. No stridor. No respiratory distress. She has no wheezes. She has no rales. She exhibits no tenderness.  Abdominal: Soft. Bowel sounds are normal. She exhibits no distension and no mass. There is no tenderness. There is no rebound and no guarding.  Genitourinary:  Genitourinary Comments: Breast and Pelvic exam deferred- done at GYN  Musculoskeletal: Normal range of motion. She exhibits no edema, tenderness or deformity.  Lymphadenopathy:    She has no cervical adenopathy.  Neurological: She is alert and oriented to person, place, and time. She has  normal reflexes. She displays normal reflexes. No cranial nerve deficit. She exhibits normal muscle tone. Coordination normal.  Skin: Skin is warm, dry and intact. No rash noted. She is not diaphoretic. No erythema. No pallor.  Psychiatric: She has a normal mood and affect. Her speech is normal and behavior is normal. Judgment and thought content normal. Cognition and memory are normal.  Nursing note and vitals reviewed.   Results for orders placed or performed in visit on 11/25/15  Lipid Panel Piccolo, [employer]  Result Value Ref Range   Cholesterol Piccolo, Waived 231 (H) <200 mg/dL   HDL Chol Piccolo, Waived 26 (L) >59 mg/dL   Triglycerides Piccolo,Waived 385 (H) <150 mg/dL   Chol/HDL Ratio Piccolo,Waive 8.9 (H) mg/dL   LDL Chol Calc Piccolo Waived 128 (  H) <100 mg/dL   VLDL Chol Calc Piccolo,Waive 77 (H) <30 mg/dL      Assessment & Plan:   Problem List Items Addressed This Visit      Endocrine   PCOS (polycystic ovarian syndrome)    Continue to follow with GYN. Call with any concerns.       Relevant Orders   CBC with Differential/Platelet   TSH   UA/M w/rflx Culture, Routine   Hgb A1c w/o eAG     Other   Hyperlipidemia    Not fasting today. Rechecking levels. Await results and treat as needed. Currently pregnant.       Relevant Orders   CBC with Differential/Platelet   Comprehensive metabolic panel   Lipid Panel w/o Chol/HDL Ratio   Elevated LFTs    Other Visit Diagnoses    Routine general medical examination at a health care facility    -  Primary   Up to date on vaccines. Screening labs checked today. Pap up to date. Continue diet and exercise.    Relevant Orders   CBC with Differential/Platelet   Comprehensive metabolic panel   Lipid Panel w/o Chol/HDL Ratio   TSH   UA/M w/rflx Culture, Routine   Hgb A1c w/o eAG       Follow up plan: Return After delivery, for HLD follow up.  LABORATORY TESTING:  - Pap smear: up to date  IMMUNIZATIONS:   - Tdap:  Tetanus vaccination status reviewed: last tetanus booster within 10 years. - Influenza: Up to date - Pneumovax: Not applicable - Prevnar: Not applicable - Zostavax vaccine: Not applicable  PATIENT COUNSELING:   Advised to take 1 mg of folate supplement per day if capable of pregnancy.   Sexuality: Discussed sexually transmitted diseases, partner selection, use of condoms, avoidance of unintended pregnancy  and contraceptive alternatives.   Advised to avoid cigarette smoking.  I discussed with the patient that most people either abstain from alcohol or drink within safe limits (<=14/week and <=4 drinks/occasion for males, <=7/weeks and <= 3 drinks/occasion for females) and that the risk for alcohol disorders and other health effects rises proportionally with the number of drinks per week and how often a drinker exceeds daily limits.  Discussed cessation/primary prevention of drug use and availability of treatment for abuse.   Diet: Encouraged to adjust caloric intake to maintain  or achieve ideal body weight, to reduce intake of dietary saturated fat and total fat, to limit sodium intake by avoiding high sodium foods and not adding table salt, and to maintain adequate dietary potassium and calcium preferably from fresh fruits, vegetables, and low-fat dairy products.    stressed the importance of regular exercise  Injury prevention: Discussed safety belts, safety helmets, smoke detector, smoking near bedding or upholstery.   Dental health: Discussed importance of regular tooth brushing, flossing, and dental visits.    NEXT PREVENTATIVE PHYSICAL DUE IN 1 YEAR. Return After delivery, for HLD follow up.

## 2016-05-27 NOTE — Assessment & Plan Note (Signed)
Continue to follow with GYN. Call with any concerns.  °

## 2016-05-28 LAB — TSH: TSH: 4.31 u[IU]/mL (ref 0.450–4.500)

## 2016-05-28 LAB — CBC WITH DIFFERENTIAL/PLATELET
BASOS ABS: 0.1 10*3/uL (ref 0.0–0.2)
Basos: 1 %
EOS (ABSOLUTE): 0.3 10*3/uL (ref 0.0–0.4)
Eos: 4 %
HEMOGLOBIN: 12.2 g/dL (ref 11.1–15.9)
Hematocrit: 37.7 % (ref 34.0–46.6)
Immature Grans (Abs): 0 10*3/uL (ref 0.0–0.1)
Immature Granulocytes: 0 %
LYMPHS ABS: 1.7 10*3/uL (ref 0.7–3.1)
Lymphs: 21 %
MCH: 27.7 pg (ref 26.6–33.0)
MCHC: 32.4 g/dL (ref 31.5–35.7)
MCV: 86 fL (ref 79–97)
MONOS ABS: 0.5 10*3/uL (ref 0.1–0.9)
Monocytes: 6 %
NEUTROS ABS: 5.3 10*3/uL (ref 1.4–7.0)
Neutrophils: 68 %
Platelets: 289 10*3/uL (ref 150–379)
RBC: 4.41 x10E6/uL (ref 3.77–5.28)
RDW: 13.8 % (ref 12.3–15.4)
WBC: 7.8 10*3/uL (ref 3.4–10.8)

## 2016-05-28 LAB — LIPID PANEL W/O CHOL/HDL RATIO
CHOLESTEROL TOTAL: 228 mg/dL — AB (ref 100–199)
HDL: 30 mg/dL — AB (ref >39)
LDL Calculated: 130 mg/dL — ABNORMAL HIGH (ref 0–99)
Triglycerides: 338 mg/dL — ABNORMAL HIGH (ref 0–149)
VLDL CHOLESTEROL CAL: 68 mg/dL — AB (ref 5–40)

## 2016-05-28 LAB — COMPREHENSIVE METABOLIC PANEL
ALBUMIN: 4.3 g/dL (ref 3.5–5.5)
ALK PHOS: 51 IU/L (ref 39–117)
ALT: 55 IU/L — ABNORMAL HIGH (ref 0–32)
AST: 31 IU/L (ref 0–40)
Albumin/Globulin Ratio: 1.5 (ref 1.2–2.2)
BILIRUBIN TOTAL: 0.6 mg/dL (ref 0.0–1.2)
BUN / CREAT RATIO: 15 (ref 9–23)
BUN: 8 mg/dL (ref 6–20)
CHLORIDE: 102 mmol/L (ref 96–106)
CO2: 22 mmol/L (ref 18–29)
Calcium: 9.3 mg/dL (ref 8.7–10.2)
Creatinine, Ser: 0.54 mg/dL — ABNORMAL LOW (ref 0.57–1.00)
GFR calc Af Amer: 147 mL/min/{1.73_m2} (ref >59)
GFR calc non Af Amer: 128 mL/min/{1.73_m2} (ref >59)
GLOBULIN, TOTAL: 2.8 g/dL (ref 1.5–4.5)
GLUCOSE: 91 mg/dL (ref 65–99)
Potassium: 4.6 mmol/L (ref 3.5–5.2)
SODIUM: 140 mmol/L (ref 134–144)
Total Protein: 7.1 g/dL (ref 6.0–8.5)

## 2016-05-28 LAB — HGB A1C W/O EAG: HEMOGLOBIN A1C: 5.2 % (ref 4.8–5.6)

## 2016-06-02 DIAGNOSIS — Z3A14 14 weeks gestation of pregnancy: Secondary | ICD-10-CM | POA: Diagnosis not present

## 2016-06-02 DIAGNOSIS — Z3493 Encounter for supervision of normal pregnancy, unspecified, third trimester: Secondary | ICD-10-CM | POA: Diagnosis not present

## 2016-06-02 DIAGNOSIS — O99280 Endocrine, nutritional and metabolic diseases complicating pregnancy, unspecified trimester: Secondary | ICD-10-CM | POA: Diagnosis not present

## 2016-06-02 DIAGNOSIS — E039 Hypothyroidism, unspecified: Secondary | ICD-10-CM | POA: Diagnosis not present

## 2016-06-30 ENCOUNTER — Ambulatory Visit (INDEPENDENT_AMBULATORY_CARE_PROVIDER_SITE_OTHER): Payer: 59 | Admitting: Certified Nurse Midwife

## 2016-06-30 ENCOUNTER — Encounter: Payer: Self-pay | Admitting: Certified Nurse Midwife

## 2016-06-30 VITALS — BP 132/62 | HR 88 | Wt 239.0 lb

## 2016-06-30 DIAGNOSIS — O0990 Supervision of high risk pregnancy, unspecified, unspecified trimester: Secondary | ICD-10-CM

## 2016-06-30 DIAGNOSIS — O0992 Supervision of high risk pregnancy, unspecified, second trimester: Secondary | ICD-10-CM

## 2016-06-30 DIAGNOSIS — E039 Hypothyroidism, unspecified: Secondary | ICD-10-CM

## 2016-06-30 DIAGNOSIS — Z139 Encounter for screening, unspecified: Secondary | ICD-10-CM

## 2016-06-30 DIAGNOSIS — O99282 Endocrine, nutritional and metabolic diseases complicating pregnancy, second trimester: Secondary | ICD-10-CM

## 2016-06-30 DIAGNOSIS — Z3A17 17 weeks gestation of pregnancy: Secondary | ICD-10-CM

## 2016-06-30 DIAGNOSIS — R740 Nonspecific elevation of levels of transaminase and lactic acid dehydrogenase [LDH]: Secondary | ICD-10-CM

## 2016-06-30 DIAGNOSIS — R7401 Elevation of levels of liver transaminase levels: Secondary | ICD-10-CM

## 2016-06-30 NOTE — Progress Notes (Signed)
Feeling flutters. Grieving for MGF who died from a heart attack last week. Saw PCP for elevated ALT. Rec repeat ALT level. Needs TSH and free T4 at [facility]. Taking  25 mcg of Synthroid. 3hr GTT WNL Advised needs repeat at 28 weeks.FH at U-3.FHTs WNL Anatomy scan scheduled for 2 weeks.

## 2016-06-30 NOTE — Progress Notes (Signed)
Pt reports pain in lower left back/thigh

## 2016-07-14 ENCOUNTER — Ambulatory Visit (INDEPENDENT_AMBULATORY_CARE_PROVIDER_SITE_OTHER): Payer: 59

## 2016-07-14 ENCOUNTER — Ambulatory Visit (INDEPENDENT_AMBULATORY_CARE_PROVIDER_SITE_OTHER): Payer: 59 | Admitting: Certified Nurse Midwife

## 2016-07-14 VITALS — BP 110/70 | HR 77 | Wt 237.0 lb

## 2016-07-14 DIAGNOSIS — Z3A19 19 weeks gestation of pregnancy: Secondary | ICD-10-CM | POA: Diagnosis not present

## 2016-07-14 DIAGNOSIS — E039 Hypothyroidism, unspecified: Secondary | ICD-10-CM | POA: Diagnosis not present

## 2016-07-14 DIAGNOSIS — Z139 Encounter for screening, unspecified: Secondary | ICD-10-CM

## 2016-07-14 DIAGNOSIS — O0992 Supervision of high risk pregnancy, unspecified, second trimester: Secondary | ICD-10-CM

## 2016-07-14 DIAGNOSIS — O0990 Supervision of high risk pregnancy, unspecified, unspecified trimester: Secondary | ICD-10-CM

## 2016-07-14 DIAGNOSIS — O99210 Obesity complicating pregnancy, unspecified trimester: Secondary | ICD-10-CM

## 2016-07-14 DIAGNOSIS — O99282 Endocrine, nutritional and metabolic diseases complicating pregnancy, second trimester: Secondary | ICD-10-CM

## 2016-07-14 NOTE — Progress Notes (Signed)
Anatomy scan today: CGA 19 wk. Normal but incomplete anatomy scan. Placenta anterior. It's a BOY Cynthia George([name])! Pt reports no problems. TSH and free T4 today. Continues on levothyroxine 50 mcg. FU scan and ROB in 4 weeks.

## 2016-07-15 LAB — TSH: TSH: 4.18 u[IU]/mL (ref 0.450–4.500)

## 2016-07-15 LAB — T4, FREE: Free T4: 1.02 ng/dL (ref 0.82–1.77)

## 2016-07-27 ENCOUNTER — Encounter: Payer: Self-pay | Admitting: Certified Nurse Midwife

## 2016-08-02 ENCOUNTER — Telehealth: Payer: Self-pay

## 2016-08-02 NOTE — Telephone Encounter (Signed)
Pt calling c/o tingling in both legs from the knee up and when it happens it feels like her legs will give out on her.  It's been going on for a week or two.  Per JEG pt aware it couldl be the baby hitting a nerve just right.  To do some leg stretches and move around more.  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  She has an appt next Wed.  Adv to do stretches and see how she does between now and then.  Pt to discuss c provider at [facility] on the 25th.

## 2016-08-04 DIAGNOSIS — H52223 Regular astigmatism, bilateral: Secondary | ICD-10-CM | POA: Diagnosis not present

## 2016-08-04 DIAGNOSIS — H5213 Myopia, bilateral: Secondary | ICD-10-CM | POA: Diagnosis not present

## 2016-08-11 ENCOUNTER — Ambulatory Visit (INDEPENDENT_AMBULATORY_CARE_PROVIDER_SITE_OTHER): Payer: 59

## 2016-08-11 ENCOUNTER — Ambulatory Visit (INDEPENDENT_AMBULATORY_CARE_PROVIDER_SITE_OTHER): Payer: 59 | Admitting: Obstetrics and Gynecology

## 2016-08-11 VITALS — BP 118/76 | Wt 243.0 lb

## 2016-08-11 DIAGNOSIS — O0992 Supervision of high risk pregnancy, unspecified, second trimester: Secondary | ICD-10-CM

## 2016-08-11 DIAGNOSIS — Z139 Encounter for screening, unspecified: Secondary | ICD-10-CM | POA: Diagnosis not present

## 2016-08-11 DIAGNOSIS — O99210 Obesity complicating pregnancy, unspecified trimester: Secondary | ICD-10-CM

## 2016-08-11 DIAGNOSIS — Z3A23 23 weeks gestation of pregnancy: Secondary | ICD-10-CM

## 2016-08-11 DIAGNOSIS — O0990 Supervision of high risk pregnancy, unspecified, unspecified trimester: Secondary | ICD-10-CM

## 2016-08-11 NOTE — Progress Notes (Signed)
Repeat Anatomy scan today. suboptimal for nose/lips.  Repeat 4 chamber due to small-appearing left ventricular diameter.  Most likely OK due to some images looking fairly normal, but since we're getting N/L, will recheck heart too. No vb. No lof. f/u 2 weeks.

## 2016-08-19 ENCOUNTER — Observation Stay
Admission: EM | Admit: 2016-08-19 | Discharge: 2016-08-19 | Disposition: A | Discharge status: Home / Self Care | Payer: 59 | Attending: Obstetrics and Gynecology | Admitting: Obstetrics and Gynecology

## 2016-08-19 DIAGNOSIS — F419 Anxiety disorder, unspecified: Secondary | ICD-10-CM | POA: Insufficient documentation

## 2016-08-19 DIAGNOSIS — Z79899 Other long term (current) drug therapy: Secondary | ICD-10-CM | POA: Diagnosis not present

## 2016-08-19 DIAGNOSIS — O99342 Other mental disorders complicating pregnancy, second trimester: Secondary | ICD-10-CM | POA: Insufficient documentation

## 2016-08-19 DIAGNOSIS — O26892 Other specified pregnancy related conditions, second trimester: Principal | ICD-10-CM | POA: Insufficient documentation

## 2016-08-19 DIAGNOSIS — R109 Unspecified abdominal pain: Secondary | ICD-10-CM | POA: Diagnosis not present

## 2016-08-19 DIAGNOSIS — B9689 Other specified bacterial agents as the cause of diseases classified elsewhere: Secondary | ICD-10-CM | POA: Diagnosis present

## 2016-08-19 DIAGNOSIS — N760 Acute vaginitis: Secondary | ICD-10-CM | POA: Insufficient documentation

## 2016-08-19 DIAGNOSIS — Z3A24 24 weeks gestation of pregnancy: Secondary | ICD-10-CM | POA: Insufficient documentation

## 2016-08-19 DIAGNOSIS — R1032 Left lower quadrant pain: Secondary | ICD-10-CM | POA: Diagnosis not present

## 2016-08-19 DIAGNOSIS — E785 Hyperlipidemia, unspecified: Secondary | ICD-10-CM | POA: Insufficient documentation

## 2016-08-19 DIAGNOSIS — F329 Major depressive disorder, single episode, unspecified: Secondary | ICD-10-CM | POA: Insufficient documentation

## 2016-08-19 DIAGNOSIS — O26899 Other specified pregnancy related conditions, unspecified trimester: Secondary | ICD-10-CM | POA: Diagnosis present

## 2016-08-19 LAB — WET PREP, GENITAL
Sperm: NONE SEEN
Trich, Wet Prep: NONE SEEN
Yeast Wet Prep HPF POC: NONE SEEN

## 2016-08-19 LAB — URINALYSIS, COMPLETE (UACMP) WITH MICROSCOPIC
Bacteria, UA: NONE SEEN
Bilirubin Urine: NEGATIVE
GLUCOSE, UA: NEGATIVE mg/dL
Hgb urine dipstick: NEGATIVE
KETONES UR: 5 mg/dL — AB
Leukocytes, UA: NEGATIVE
Nitrite: NEGATIVE
PH: 5 (ref 5.0–8.0)
Protein, ur: NEGATIVE mg/dL
Specific Gravity, Urine: 1.026 (ref 1.005–1.030)

## 2016-08-19 MED ORDER — CLINDAMYCIN HCL 300 MG PO CAPS: 300.0000 mg | ORAL_CAPSULE | Freq: Two times a day (BID) | ORAL | 0 refills | Status: DC

## 2016-08-19 NOTE — Final Progress Note (Signed)
Physician Final Progress Note  Patient ID: Cynthia George MRN: [id number] DOB/AGE: 1985/07/13 31 y.o.  Admit date: 08/19/2016 Admitting provider: Conard NovakStephen D Rabecca Birge, MD Discharge date: 08/19/2016   Admission Diagnoses:  1) abdominal pain affecting pregnancy, antepartum  Discharge Diagnoses:  Active Problems:   Abdominal pain affecting pregnancy, antepartum   Bacterial vaginosis   History of Present Illness: The patient is a 31 y.o. female G1P0 at 3061w2d who presents for acute-onset abdominal pain that began this evening. 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 She tried to take it easy and even drank some juice, but this did not help. Nothing seems to make it worse.  She has noted a new, abnormal vaginal discharge and recent itching for which she has treated herself with a single dose of topical monistat last night.  She denies urinary, GI, GU symptoms. Notes +FM, no LOF, no vaginal bleeding. She is unsure what contractions feel like. So, is unsure if her pain is contractions or something else.   Hospital Course: Admitted for the above. Exam performed as documented.  UA not suggestive of infection, no cervical dilation, fetal assessment reassuring.  BV suggested on wet prep and with symptoms will treat.  Patient no longer had pain once recumbent in the unit.  Will send home with strict precautions and treatment for BV.  Past Medical History:  Diagnosis Date  . Anxiety   . Depression   . Hyperlipidemia   . Migraines   . PCOS (polycystic ovarian syndrome)     Past Surgical History:  Procedure Laterality Date  . APPENDECTOMY  2013  . HUMERUS FRACTURE SURGERY Left 2002,2003   rod and screws placed and removed  . TONSILLECTOMY AND ADENOIDECTOMY  2004  . WISDOM TOOTH EXTRACTION  2011   All    No current facility-administered medications on file prior to encounter.     Current Outpatient Prescriptions on File Prior to Encounter  Medication Sig Dispense Refill  . Docosahexaenoic Acid (DHA COMPLETE PO) Take by mouth.    . folic acid (FOLVITE) 1 MG tablet Take 1 mg by mouth daily.    Marland Kitchen. levothyroxine (SYNTHROID, LEVOTHROID) 50 MCG tablet Take 50 mcg by mouth daily before breakfast.    . Magnesium 250 MG TABS Take by mouth.    . Prenatal MV-Min-Fe Fum-FA-DHA (PRENATAL 1 PO) Take by mouth.      Allergies  Allergen Reactions  . Flagyl [Metronidazole] Rash  . Keflex [Cephalexin] Rash  . Septra [Sulfamethoxazole-Trimethoprim] Rash    Social History   Social History  . Marital status: Married    Spouse name: N/A  . Number of children: N/A  . Years of education: N/A   Occupational History  . Not on file.   Social History Main Topics  . Smoking status: Never Smoker  . Smokeless tobacco: Never Used  . Alcohol use No  . Drug use: No  . Sexual activity: Yes    Birth control/ protection: None   Other Topics Concern  . Not on file   Social History Narrative  . No narrative on file    Physical Exam: LMP 03/02/2016 (Exact Date)  normotensive, afebrile Physical Exam  Constitutional: She is oriented to person, place, and time and well-developed, well-nourished, and in no distress. No distress.  HENT:  Head: Normocephalic and atraumatic.  Eyes: Conjunctivae are normal. No scleral icterus.  Abdominal:  Soft.  Gravid, nt  Genitourinary:  Genitourinary Comments: NEFG, vagina pink and normally rugated, no lesions noted. Cervix appears closed and this is verified with digital exam (closed, thick, high).  No purulent discharge. No CMT.  Neurological: She is alert and oriented to person, place, and time. No cranial nerve deficit.  Psychiatric: Mood, affect and judgment normal.   Female chaperone present for pelvic exam.   Consults: None  Significant Findings/ Diagnostic Studies:  Lab Results  Component Value Date   APPEARANCEUR CLOUDY (A)  08/19/2016   GLUCOSEU NEGATIVE 08/19/2016   BILIRUBINUR NEGATIVE 08/19/2016   KETONESUR 5 (A) 08/19/2016   LABSPEC 1.026 08/19/2016   HGBUR NEGATIVE 08/19/2016   PHURINE 5.0 08/19/2016   NITRITE NEGATIVE 08/19/2016   LEUKOCYTESUR NEGATIVE 08/19/2016   RBCU TOO NUMEROUS TO COUNT 08/19/2016   WBCU 0-5 08/19/2016   BACTERIA NONE SEEN 08/19/2016   EPIU 6-30 (A) 08/19/2016   MUCOUSUACOMP PRESENT 08/19/2016    Lab Results  Component Value Date   TRICHWETPREP NONE SEEN 08/19/2016   CLUECELLS PRESENT (A) 08/19/2016   WBCWETPREP MODERATE (A) 08/19/2016   YEASTWETPREP NONE SEEN 08/19/2016     Procedures:  Fetal heart tones present at about 140 bpm (monitored for about 40 minutes) No tocometric activity.  Discharge Condition: stable  Disposition: 01-Home or Self Care  Diet: Regular diet  Discharge Activity: Activity as tolerated   Allergies as of 08/19/2016      Reactions   Flagyl [metronidazole] Rash   Keflex [cephalexin] Rash   Septra [sulfamethoxazole-trimethoprim] Rash      Medication List    TAKE these medications   clindamycin 300 MG capsule Commonly known as:  CLEOCIN Take 1 capsule (300 mg total) by mouth 2 (two) times daily. For seven days   DHA COMPLETE PO Take by mouth.   folic acid 1 MG tablet Commonly known as:  FOLVITE Take 1 mg by mouth daily.   levothyroxine 50 MCG tablet Commonly known as:  SYNTHROID, LEVOTHROID Take 50 mcg by mouth daily before breakfast.   Magnesium 250 MG Tabs Take by mouth.   PRENATAL 1 PO Take by mouth.        Total time spent taking care of this patient: 20 minutes  Signed: Thomasene Mohair, MD  08/19/2016, 10:29 PM

## 2016-08-19 NOTE — Discharge Instructions (Signed)
Discharge instructions given, all questions answered.

## 2016-08-20 NOTE — Discharge Summary (Signed)
See Final Progress Note 

## 2016-09-01 ENCOUNTER — Ambulatory Visit (INDEPENDENT_AMBULATORY_CARE_PROVIDER_SITE_OTHER): Payer: 59

## 2016-09-01 ENCOUNTER — Ambulatory Visit (INDEPENDENT_AMBULATORY_CARE_PROVIDER_SITE_OTHER): Payer: 59 | Admitting: Obstetrics and Gynecology

## 2016-09-01 VITALS — BP 124/66 | Wt 244.0 lb

## 2016-09-01 DIAGNOSIS — O0990 Supervision of high risk pregnancy, unspecified, unspecified trimester: Secondary | ICD-10-CM

## 2016-09-01 DIAGNOSIS — Z362 Encounter for other antenatal screening follow-up: Secondary | ICD-10-CM | POA: Diagnosis not present

## 2016-09-01 DIAGNOSIS — Z3A26 26 weeks gestation of pregnancy: Secondary | ICD-10-CM

## 2016-09-01 DIAGNOSIS — Z113 Encounter for screening for infections with a predominantly sexual mode of transmission: Secondary | ICD-10-CM

## 2016-09-01 DIAGNOSIS — O99210 Obesity complicating pregnancy, unspecified trimester: Secondary | ICD-10-CM

## 2016-09-01 NOTE — Patient Instructions (Signed)
Third Trimester of Pregnancy The third trimester is from week 28 through week 40 (months 7 through 9). The third trimester is a time when the unborn baby (fetus) is growing rapidly. At the end of the ninth month, the fetus is about 20 inches in length and weighs 6-10 pounds. Body changes during your third trimester Your body will continue to go through many changes during pregnancy. The changes vary from woman to woman. During the third trimester:  Your weight will continue to increase. You can expect to gain 25-35 pounds (11-16 kg) by the end of the pregnancy.  You may begin to get stretch marks on your hips, abdomen, and breasts.  You may urinate more often because the fetus is moving lower into your pelvis and pressing on your bladder.  You may develop or continue to have heartburn. This is caused by increased hormones that slow down muscles in the digestive tract.  You may develop or continue to have constipation because increased hormones slow digestion and cause the muscles that push waste through your intestines to relax.  You may develop hemorrhoids. These are swollen veins (varicose veins) in the rectum that can itch or be painful.  You may develop swollen, bulging veins (varicose veins) in your legs.  You may have increased body aches in the pelvis, back, or thighs. This is due to weight gain and increased hormones that are relaxing your joints.  You may have changes in your hair. These can include thickening of your hair, rapid growth, and changes in texture. Some women also have hair loss during or after pregnancy, or hair that feels dry or thin. Your hair will most likely return to normal after your baby is born.  Your breasts will continue to grow and they will continue to become tender. A yellow fluid (colostrum) may leak from your breasts. This is the first milk you are producing for your baby.  Your belly button may stick out.  You may notice more swelling in your hands,  face, or ankles.  You may have increased tingling or numbness in your hands, arms, and legs. The skin on your belly may also feel numb.  You may feel short of breath because of your expanding uterus.  You may have more problems sleeping. This can be caused by the size of your belly, increased need to urinate, and an increase in your body's metabolism.  You may notice the fetus "dropping," or moving lower in your abdomen (lightening).  You may have increased vaginal discharge.  You may notice your joints feel loose and you may have pain around your pelvic bone.  What to expect at prenatal visits You will have prenatal exams every 2 weeks until week 36. Then you will have weekly prenatal exams. During a routine prenatal visit:  You will be weighed to make sure you and the baby are growing normally.  Your blood pressure will be taken.  Your abdomen will be measured to track your baby's growth.  The fetal heartbeat will be listened to.  Any test results from the previous visit will be discussed.  You may have a cervical check near your due date to see if your cervix has softened or thinned (effaced).  You will be tested for Group B streptococcus. This happens between 35 and 37 weeks.  Your health care provider may ask you:  What your birth plan is.  How you are feeling.  If you are feeling the baby move.  If you have had   any abnormal symptoms, such as leaking fluid, bleeding, severe headaches, or abdominal cramping.  If you are using any tobacco products, including cigarettes, chewing tobacco, and electronic cigarettes.  If you have any questions.  Other tests or screenings that may be performed during your third trimester include:  Blood tests that check for low iron levels (anemia).  Fetal testing to check the health, activity level, and growth of the fetus. Testing is done if you have certain medical conditions or if there are problems during the  pregnancy.  Nonstress test (NST). This test checks the health of your baby to make sure there are no signs of problems, such as the baby not getting enough oxygen. During this test, a belt is placed around your belly. The baby is made to move, and its heart rate is monitored during movement.  What is false labor? False labor is a condition in which you feel small, irregular tightenings of the muscles in the womb (contractions) that usually go away with rest, changing position, or drinking water. These are called Braxton Hicks contractions. Contractions may last for hours, days, or even weeks before true labor sets in. If contractions come at regular intervals, become more frequent, increase in intensity, or become painful, you should see your health care provider. What are the signs of labor?  Abdominal cramps.  Regular contractions that start at 10 minutes apart and become stronger and more frequent with time.  Contractions that start on the top of the uterus and spread down to the lower abdomen and back.  Increased pelvic pressure and dull back pain.  A watery or bloody mucus discharge that comes from the vagina.  Leaking of amniotic fluid. This is also known as your "water breaking." It could be a slow trickle or a gush. Let your health care provider know if it has a color or strange odor. If you have any of these signs, call your health care provider right away, even if it is before your due date. Follow these instructions at home: Medicines  Follow your health care provider's instructions regarding medicine use. Specific medicines may be either safe or unsafe to take during pregnancy.  Take a prenatal vitamin that contains at least 600 micrograms (mcg) of folic acid.  If you develop constipation, try taking a stool softener if your health care provider approves. Eating and drinking  Eat a balanced diet that includes fresh fruits and vegetables, whole grains, good sources of protein  such as meat, eggs, or tofu, and low-fat dairy. Your health care provider will help you determine the amount of weight gain that is right for you.  Avoid raw meat and uncooked cheese. These carry germs that can cause birth defects in the baby.  If you have low calcium intake from food, talk to your health care provider about whether you should take a daily calcium supplement.  Eat four or five small meals rather than three large meals a day.  Limit foods that are high in fat and processed sugars, such as fried and sweet foods.  To prevent constipation: ? Drink enough fluid to keep your urine clear or pale yellow. ? Eat foods that are high in fiber, such as fresh fruits and vegetables, whole grains, and beans. Activity  Exercise only as directed by your health care provider. Most women can continue their usual exercise routine during pregnancy. Try to exercise for 30 minutes at least 5 days a week. Stop exercising if you experience uterine contractions.  Avoid heavy   lifting.  Do not exercise in extreme heat or humidity, or at high altitudes.  Wear low-heel, comfortable shoes.  Practice good posture.  You may continue to have sex unless your health care provider tells you otherwise. Relieving pain and discomfort  Take frequent breaks and rest with your legs elevated if you have leg cramps or low back pain.  Take warm sitz baths to soothe any pain or discomfort caused by hemorrhoids. Use hemorrhoid cream if your health care provider approves.  Wear a good support bra to prevent discomfort from breast tenderness.  If you develop varicose veins: ? Wear support pantyhose or compression stockings as told by your healthcare provider. ? Elevate your feet for 15 minutes, 3-4 times a day. Prenatal care  Write down your questions. Take them to your prenatal visits.  Keep all your prenatal visits as told by your health care provider. This is important. Safety  Wear your seat belt at  all times when driving.  Make a list of emergency phone numbers, including numbers for family, friends, the hospital, and police and fire departments. General instructions  Avoid cat litter boxes and soil used by cats. These carry germs that can cause birth defects in the baby. If you have a cat, ask someone to clean the litter box for you.  Do not travel far distances unless it is absolutely necessary and only with the approval of your health care provider.  Do not use hot tubs, steam rooms, or saunas.  Do not drink alcohol.  Do not use any products that contain nicotine or tobacco, such as cigarettes and e-cigarettes. If you need help quitting, ask your health care provider.  Do not use any medicinal herbs or unprescribed drugs. These chemicals affect the formation and growth of the baby.  Do not douche or use tampons or scented sanitary pads.  Do not cross your legs for long periods of time.  To prepare for the arrival of your baby: ? Take prenatal classes to understand, practice, and ask questions about labor and delivery. ? Make a trial run to the hospital. ? Visit the hospital and tour the maternity area. ? Arrange for maternity or paternity leave through employers. ? Arrange for family and friends to take care of pets while you are in the hospital. ? Purchase a rear-facing car seat and make sure you know how to install it in your car. ? Pack your hospital bag. ? Prepare the baby's nursery. Make sure to remove all pillows and stuffed animals from the baby's crib to prevent suffocation.  Visit your dentist if you have not gone during your pregnancy. Use a soft toothbrush to brush your teeth and be gentle when you floss. Contact a health care provider if:  You are unsure if you are in labor or if your water has broken.  You become dizzy.  You have mild pelvic cramps, pelvic pressure, or nagging pain in your abdominal area.  You have lower back pain.  You have persistent  nausea, vomiting, or diarrhea.  You have an unusual or bad smelling vaginal discharge.  You have pain when you urinate. Get help right away if:  Your water breaks before 37 weeks.  You have regular contractions less than 5 minutes apart before 37 weeks.  You have a fever.  You are leaking fluid from your vagina.  You have spotting or bleeding from your vagina.  You have severe abdominal pain or cramping.  You have rapid weight loss or weight gain.    You have shortness of breath with chest pain.  You notice sudden or extreme swelling of your face, hands, ankles, feet, or legs.  Your baby makes fewer than 10 movements in 2 hours.  You have severe headaches that do not go away when you take medicine.  You have vision changes. Summary  The third trimester is from week 28 through week 40, months 7 through 9. The third trimester is a time when the unborn baby (fetus) is growing rapidly.  During the third trimester, your discomfort may increase as you and your baby continue to gain weight. You may have abdominal, leg, and back pain, sleeping problems, and an increased need to urinate.  During the third trimester your breasts will keep growing and they will continue to become tender. A yellow fluid (colostrum) may leak from your breasts. This is the first milk you are producing for your baby.  False labor is a condition in which you feel small, irregular tightenings of the muscles in the womb (contractions) that eventually go away. These are called Braxton Hicks contractions. Contractions may last for hours, days, or even weeks before true labor sets in.  Signs of labor can include: abdominal cramps; regular contractions that start at 10 minutes apart and become stronger and more frequent with time; watery or bloody mucus discharge that comes from the vagina; increased pelvic pressure and dull back pain; and leaking of amniotic fluid. This information is not intended to replace advice  given to you by your health care provider. Make sure you discuss any questions you have with your health care provider. Document Released: 03/30/2001 Document Revised: 09/11/2015 Document Reviewed: 06/06/2012 Elsevier Interactive Patient Education  2017 Elsevier Inc.  

## 2016-09-01 NOTE — Progress Notes (Signed)
Repeat US today

## 2016-09-17 ENCOUNTER — Other Ambulatory Visit: Payer: 59

## 2016-09-17 ENCOUNTER — Ambulatory Visit (INDEPENDENT_AMBULATORY_CARE_PROVIDER_SITE_OTHER): Payer: 59

## 2016-09-17 ENCOUNTER — Ambulatory Visit (INDEPENDENT_AMBULATORY_CARE_PROVIDER_SITE_OTHER): Payer: 59 | Admitting: Obstetrics and Gynecology

## 2016-09-17 VITALS — BP 134/68 | Wt 240.0 lb

## 2016-09-17 DIAGNOSIS — O99282 Endocrine, nutritional and metabolic diseases complicating pregnancy, second trimester: Secondary | ICD-10-CM

## 2016-09-17 DIAGNOSIS — O99210 Obesity complicating pregnancy, unspecified trimester: Secondary | ICD-10-CM

## 2016-09-17 DIAGNOSIS — Z113 Encounter for screening for infections with a predominantly sexual mode of transmission: Secondary | ICD-10-CM | POA: Diagnosis not present

## 2016-09-17 DIAGNOSIS — O0990 Supervision of high risk pregnancy, unspecified, unspecified trimester: Secondary | ICD-10-CM

## 2016-09-17 DIAGNOSIS — Z3A28 28 weeks gestation of pregnancy: Secondary | ICD-10-CM

## 2016-09-17 DIAGNOSIS — Z3A26 26 weeks gestation of pregnancy: Secondary | ICD-10-CM

## 2016-09-17 DIAGNOSIS — E039 Hypothyroidism, unspecified: Secondary | ICD-10-CM | POA: Diagnosis not present

## 2016-09-17 DIAGNOSIS — Z362 Encounter for other antenatal screening follow-up: Secondary | ICD-10-CM | POA: Diagnosis not present

## 2016-09-17 NOTE — Progress Notes (Signed)
-   Thyroid panel today - growth 2lbs 7oz c/w 40%ile

## 2016-09-17 NOTE — Progress Notes (Signed)
Blurred vision/dizzy/28 week labs today

## 2016-09-18 LAB — 28 WEEK RH+PANEL
BASOS ABS: 0 10*3/uL (ref 0.0–0.2)
Basos: 0 %
EOS (ABSOLUTE): 0.2 10*3/uL (ref 0.0–0.4)
Eos: 2 %
GESTATIONAL DIABETES SCREEN: 168 mg/dL — AB (ref 65–139)
HEMATOCRIT: 32.7 % — AB (ref 34.0–46.6)
HIV Screen 4th Generation wRfx: NONREACTIVE
Hemoglobin: 10.7 g/dL — ABNORMAL LOW (ref 11.1–15.9)
IMMATURE GRANULOCYTES: 0 %
Immature Grans (Abs): 0 10*3/uL (ref 0.0–0.1)
LYMPHS ABS: 1.4 10*3/uL (ref 0.7–3.1)
Lymphs: 14 %
MCH: 27.2 pg (ref 26.6–33.0)
MCHC: 32.7 g/dL (ref 31.5–35.7)
MCV: 83 fL (ref 79–97)
MONOS ABS: 0.5 10*3/uL (ref 0.1–0.9)
Monocytes: 5 %
NEUTROS PCT: 79 %
Neutrophils Absolute: 7.8 10*3/uL — ABNORMAL HIGH (ref 1.4–7.0)
PLATELETS: 286 10*3/uL (ref 150–379)
RBC: 3.93 x10E6/uL (ref 3.77–5.28)
RDW: 14.6 % (ref 12.3–15.4)
RPR: NONREACTIVE
WBC: 10 10*3/uL (ref 3.4–10.8)

## 2016-09-18 LAB — THYROID PANEL WITH TSH
Free Thyroxine Index: 1.2 (ref 1.2–4.9)
T3 Uptake Ratio: 13 % — ABNORMAL LOW (ref 24–39)
T4 TOTAL: 9.5 ug/dL (ref 4.5–12.0)
TSH: 1.96 u[IU]/mL (ref 0.450–4.500)

## 2016-09-22 ENCOUNTER — Encounter: Payer: Self-pay | Admitting: Obstetrics and Gynecology

## 2016-09-22 ENCOUNTER — Other Ambulatory Visit: Payer: Self-pay | Admitting: Obstetrics and Gynecology

## 2016-09-22 DIAGNOSIS — O99810 Abnormal glucose complicating pregnancy: Secondary | ICD-10-CM | POA: Insufficient documentation

## 2016-09-23 ENCOUNTER — Telehealth: Payer: Self-pay | Admitting: Obstetrics and Gynecology

## 2016-09-23 NOTE — Telephone Encounter (Signed)
Called and left voicemail for patient to call to be schedule for 3 gtt to call back to be schedule

## 2016-09-23 NOTE — Telephone Encounter (Signed)
-----   Message from Vena AustriaAndreas Staebler, MD sent at 09/22/2016  1:21 PM EDT ----- Regarding: 3-hr glucose Patient needs 3-hr glucose test order is in, patient aware to expect call

## 2016-09-24 ENCOUNTER — Other Ambulatory Visit: Payer: 59

## 2016-09-24 DIAGNOSIS — O99810 Abnormal glucose complicating pregnancy: Secondary | ICD-10-CM | POA: Diagnosis not present

## 2016-09-25 LAB — GESTATIONAL GLUCOSE TOLERANCE
GLUCOSE 2 HOUR GTT: 153 mg/dL (ref 65–154)
GLUCOSE 3 HOUR GTT: 139 mg/dL (ref 65–139)
GLUCOSE FASTING: 95 mg/dL — AB (ref 65–94)
Glucose, GTT - 1 Hour: 178 mg/dL (ref 65–179)

## 2016-09-27 ENCOUNTER — Encounter: Payer: Self-pay | Admitting: Obstetrics and Gynecology

## 2016-09-29 ENCOUNTER — Ambulatory Visit (INDEPENDENT_AMBULATORY_CARE_PROVIDER_SITE_OTHER): Payer: 59 | Admitting: Obstetrics and Gynecology

## 2016-09-29 VITALS — BP 120/60 | Wt 243.0 lb

## 2016-09-29 DIAGNOSIS — Z3A30 30 weeks gestation of pregnancy: Secondary | ICD-10-CM

## 2016-09-29 DIAGNOSIS — O0990 Supervision of high risk pregnancy, unspecified, unspecified trimester: Secondary | ICD-10-CM

## 2016-09-29 DIAGNOSIS — O99210 Obesity complicating pregnancy, unspecified trimester: Secondary | ICD-10-CM

## 2016-09-29 DIAGNOSIS — Z23 Encounter for immunization: Secondary | ICD-10-CM | POA: Diagnosis not present

## 2016-09-29 NOTE — Progress Notes (Signed)
Growth scan next visit 

## 2016-09-29 NOTE — Progress Notes (Signed)
TDAP/Blood consent 

## 2016-09-29 NOTE — Addendum Note (Signed)
Addended by: [place]ORDAN, Karena Kinker B on: 09/29/2016 04:20 PM   Modules accepted: Orders

## 2016-10-01 ENCOUNTER — Encounter: Payer: Self-pay | Admitting: Obstetrics and Gynecology

## 2016-10-04 ENCOUNTER — Other Ambulatory Visit: Payer: Self-pay | Admitting: Obstetrics and Gynecology

## 2016-10-04 MED ORDER — LEVOTHYROXINE SODIUM 50 MCG PO TABS: 50.0000 ug | ORAL_TABLET | Freq: Every day | ORAL | 6 refills | Status: DC

## 2016-10-06 ENCOUNTER — Other Ambulatory Visit: Payer: Self-pay

## 2016-10-06 DIAGNOSIS — E039 Hypothyroidism, unspecified: Secondary | ICD-10-CM

## 2016-10-06 MED ORDER — LEVOTHYROXINE SODIUM 50 MCG PO TABS: 50.0000 ug | ORAL_TABLET | Freq: Every day | ORAL | 6 refills | Status: DC

## 2016-10-06 MED ORDER — LEVOTHYROXINE SODIUM 50 MCG PO TABS
50.0000 ug | ORAL_TABLET | Freq: Every day | ORAL | 6 refills | Status: DC
Start: 2016-10-06 — End: 2016-10-06

## 2016-10-13 ENCOUNTER — Ambulatory Visit (INDEPENDENT_AMBULATORY_CARE_PROVIDER_SITE_OTHER): Payer: 59 | Admitting: Certified Nurse Midwife

## 2016-10-13 ENCOUNTER — Ambulatory Visit (INDEPENDENT_AMBULATORY_CARE_PROVIDER_SITE_OTHER): Payer: 59

## 2016-10-13 VITALS — BP 128/66 | Wt 241.0 lb

## 2016-10-13 DIAGNOSIS — Z362 Encounter for other antenatal screening follow-up: Secondary | ICD-10-CM | POA: Diagnosis not present

## 2016-10-13 DIAGNOSIS — Z3A32 32 weeks gestation of pregnancy: Secondary | ICD-10-CM

## 2016-10-13 DIAGNOSIS — O99210 Obesity complicating pregnancy, unspecified trimester: Secondary | ICD-10-CM

## 2016-10-13 DIAGNOSIS — O0990 Supervision of high risk pregnancy, unspecified, unspecified trimester: Secondary | ICD-10-CM

## 2016-10-13 DIAGNOSIS — Z3A30 30 weeks gestation of pregnancy: Secondary | ICD-10-CM | POA: Diagnosis not present

## 2016-10-13 DIAGNOSIS — O99283 Endocrine, nutritional and metabolic diseases complicating pregnancy, third trimester: Secondary | ICD-10-CM

## 2016-10-13 DIAGNOSIS — E039 Hypothyroidism, unspecified: Secondary | ICD-10-CM

## 2016-10-13 NOTE — Progress Notes (Signed)
Doing well. Baby active. Growth scan: EFW 4#3oz (46.6%), AFI 21.94cm, vertex Breast/ minipill ROB 2 weeks

## 2016-10-13 NOTE — Progress Notes (Signed)
F/u growth u/s today. Pt reports no problems.

## 2016-10-14 ENCOUNTER — Encounter: Payer: Self-pay | Admitting: Obstetrics and Gynecology

## 2016-10-19 NOTE — Telephone Encounter (Signed)
We have received paperwork from [employer]. Ready to be filled out. Patient will need to come by the office to fill out forms and pay. Will call her and let her know. Thanks

## 2016-10-21 ENCOUNTER — Telehealth: Payer: Self-pay

## 2016-10-21 NOTE — Telephone Encounter (Signed)
FMLA/DISABILITY form for [employer] filled out and given to [employer] for processing. 

## 2016-10-27 ENCOUNTER — Ambulatory Visit (INDEPENDENT_AMBULATORY_CARE_PROVIDER_SITE_OTHER): Payer: 59 | Admitting: Advanced Practice Midwife

## 2016-10-27 VITALS — BP 128/78 | Wt 245.0 lb

## 2016-10-27 DIAGNOSIS — Z3A34 34 weeks gestation of pregnancy: Secondary | ICD-10-CM

## 2016-10-27 DIAGNOSIS — O0990 Supervision of high risk pregnancy, unspecified, unspecified trimester: Secondary | ICD-10-CM

## 2016-10-27 DIAGNOSIS — Z3689 Encounter for other specified antenatal screening: Secondary | ICD-10-CM

## 2016-10-27 NOTE — Progress Notes (Signed)
Complaint of pelvic pressure. Denies contractions, LOF, VB. Good fetal movement. Growth scan, NST, GBS/aptima nv.

## 2016-10-27 NOTE — Progress Notes (Signed)
No vb. No lof.  

## 2016-11-01 ENCOUNTER — Observation Stay
Admission: EM | Admit: 2016-11-01 | Discharge: 2016-11-01 | Disposition: A | Discharge status: Home / Self Care | Payer: 59 | Attending: Certified Nurse Midwife | Admitting: Certified Nurse Midwife

## 2016-11-01 DIAGNOSIS — O163 Unspecified maternal hypertension, third trimester: Principal | ICD-10-CM | POA: Insufficient documentation

## 2016-11-01 DIAGNOSIS — Z79899 Other long term (current) drug therapy: Secondary | ICD-10-CM | POA: Diagnosis not present

## 2016-11-01 DIAGNOSIS — Z3A34 34 weeks gestation of pregnancy: Secondary | ICD-10-CM | POA: Diagnosis not present

## 2016-11-01 DIAGNOSIS — O139 Gestational [pregnancy-induced] hypertension without significant proteinuria, unspecified trimester: Secondary | ICD-10-CM | POA: Diagnosis present

## 2016-11-01 LAB — CBC
HEMATOCRIT: 33.5 % — AB (ref 35.0–47.0)
HEMOGLOBIN: 11.3 g/dL — AB (ref 12.0–16.0)
MCH: 26.6 pg (ref 26.0–34.0)
MCHC: 33.7 g/dL (ref 32.0–36.0)
MCV: 78.9 fL — ABNORMAL LOW (ref 80.0–100.0)
Platelets: 249 10*3/uL (ref 150–440)
RBC: 4.24 MIL/uL (ref 3.80–5.20)
RDW: 15.5 % — ABNORMAL HIGH (ref 11.5–14.5)
WBC: 9.7 10*3/uL (ref 3.6–11.0)

## 2016-11-01 LAB — COMPREHENSIVE METABOLIC PANEL
ALBUMIN: 2.9 g/dL — AB (ref 3.5–5.0)
ALT: 17 U/L (ref 14–54)
ANION GAP: 8 (ref 5–15)
AST: 17 U/L (ref 15–41)
Alkaline Phosphatase: 87 U/L (ref 38–126)
BILIRUBIN TOTAL: 0.6 mg/dL (ref 0.3–1.2)
BUN: 9 mg/dL (ref 6–20)
CHLORIDE: 107 mmol/L (ref 101–111)
CO2: 22 mmol/L (ref 22–32)
Calcium: 9.1 mg/dL (ref 8.9–10.3)
Creatinine, Ser: 0.53 mg/dL (ref 0.44–1.00)
GFR calc Af Amer: >60 mL/min (ref >60)
GFR calc non Af Amer: >60 mL/min (ref >60)
GLUCOSE: 72 mg/dL (ref 65–99)
POTASSIUM: 4.3 mmol/L (ref 3.5–5.1)
SODIUM: 137 mmol/L (ref 135–145)
TOTAL PROTEIN: 6.6 g/dL (ref 6.5–8.1)

## 2016-11-01 LAB — PROTEIN / CREATININE RATIO, URINE
CREATININE, URINE: 169 mg/dL
Protein Creatinine Ratio: 0.15 mg/mg{Cre} (ref 0.00–0.15)
TOTAL PROTEIN, URINE: 26 mg/dL

## 2016-11-01 NOTE — OB Triage Note (Signed)
Pt c/o facial and hand swelling today. She denies any vision changes, or headache. She also c/o elevated blood pressure of 146/78.

## 2016-11-01 NOTE — Discharge Instructions (Signed)
Hypertension During Pregnancy Hypertension is also called high blood pressure. High blood pressure means that the force of your blood moving in your body is too strong. When you are pregnant, this condition should be watched carefully. It can cause problems for you and your baby. Follow these instructions at home: Eating and drinking  Drink enough fluid to keep your pee (urine) clear or pale yellow.  Eat healthy foods that are low in salt (sodium). ? Do not add salt to your food. ? Check labels on foods and drinks to see much salt is in them. Look on the label where you see "Sodium." Lifestyle  Do not use any products that contain nicotine or tobacco, such as cigarettes and e-cigarettes. If you need help quitting, ask your doctor.  Do not use alcohol.  Avoid caffeine.  Avoid stress. Rest and get plenty of sleep. General instructions  Take over-the-counter and prescription medicines only as told by your doctor.  While lying down, lie on your left side. This keeps pressure off your baby.  While sitting or lying down, raise (elevate) your feet. Try putting some pillows under your lower legs.  Exercise regularly. Ask your doctor what kinds of exercise are best for you.  Keep all prenatal and follow-up visits as told by your doctor. This is important. Contact a doctor if:  You have symptoms that your doctor told you to watch for, such as: ? Fever. ? Throwing up (vomiting). ? Headache. Get help right away if:  You have very bad pain in your belly (abdomen).  You are throwing up, and this does not get better with treatment.  You suddenly get swelling in your hands, ankles, or face.  You gain 4 lb (1.8 kg) or more in 1 week.  You get bleeding from your vagina.  You have blood in your pee.  You do not feel your baby moving as much as normal.  You have a change in vision.  You have muscle twitching or sudden tightening (spasms).  You have trouble breathing.  Your lips  or fingernails turn blue. This information is not intended to replace advice given to you by your health care provider. Make sure you discuss any questions you have with your health care provider. Document Released: 05/08/2010 Document Revised: 12/16/2015 Document Reviewed: 12/16/2015 Elsevier Interactive Patient Education  2017 Elsevier Inc.  

## 2016-11-01 NOTE — Final Progress Note (Signed)
Physician Final Progress Note  Patient ID: Cynthia George MRN: [id number] DOB/AGE: 1985-05-19 31 y.o.  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 Baby active. Denies persistent headaches (had a headache x 2 days last week), nausea, vomiting, RUQ pain or visual changes.  Prenatal care at [facility] remarkable for hypothyroidism, obesity (BMI>40), and infertility (conceived on Clomid). She had an early elevated glucola with normal 3hr GTTs x2 (second 3 hr had a elevated fasting of 95). Her past medical history is also remarkable for migraines, anxiety, depression, PCOS and hyperlipidemia.  Past Surgical History:  Procedure Laterality Date  . APPENDECTOMY  2013  . HUMERUS FRACTURE SURGERY Left 2002,2003   rod and screws placed and removed  . TONSILLECTOMY AND ADENOIDECTOMY  2004  . WISDOM TOOTH EXTRACTION  2011   All   Family History  Problem Relation Age of Onset  . Heart disease Mother   . Hypertension Mother   . Hyperlipidemia Mother   . Heart attack Mother   . Hyperlipidemia Father   . Hypertension Father   . Diabetes Father   . Asthma Father   . Heart attack Father 48  . Hyperlipidemia Sister   . Asthma Sister   . Polycystic ovary syndrome Sister   . Mental illness Sister        Depression  . Mental illness Brother        Depression  . Diabetes Maternal Grandmother   . Hyperlipidemia Maternal Grandmother   . Hypertension Maternal  Grandmother   . Diabetes Maternal Grandfather   . Hypertension Maternal Grandfather   . Heart attack Maternal Grandfather   . Diabetes Paternal Grandmother   . Heart attack Paternal Grandfather    Social History   Social History  . Marital status: Married    Spouse name: N/A  . Number of children: N/A  . Years of education: N/A   Occupational History  . Not on file.   Social History Main Topics  . Smoking status: Never Smoker  . Smokeless tobacco: Never Used  . Alcohol use No  . Drug use: No  . Sexual activity: Yes    Birth control/ protection: None   Other Topics Concern  . Not on file   Social History Narrative  . No narrative on file   EXAM:  VS: 134/65, 119/61, 123/67, 125/56, 141/66, 125/60, 141/64, 124/66 Pulse: 72-80 Afebrile.  General: BF, gravid, in NAD Heart: RRR without murmur Lungs: CTAB Abdomen: soft, NT, cephalic presentation (c/w ultrasound) FHR: 135 BPM with accelerations to 150s, moderate variability Toco: mostly uterine irritability, with irregular contractions q 8+ min apart, mild( most not felt by patient) Cervix: FT/70%/-1 to -2 Skin: edematous hands/fingers, and pitting edema of lower abdomen. +1 to +2 edema of calves/ankles Neuro: alert, awake, oriented x3 Results for orders placed or performed during the hospital encounter of 11/01/16 (from the past 24 hour(s))  CBC     Status: Abnormal   Collection Time: 11/01/16 12:08 PM  Result Value Ref Range  WBC 9.7 3.6 - 11.0 K/uL   RBC 4.24 3.80 - 5.20 MIL/uL   Hemoglobin 11.3 (L) 12.0 - 16.0 g/dL   HCT 16.133.5 (L) 09.635.0 - 04.547.0 %   MCV 78.9 (L) 80.0 - 100.0 fL   MCH 26.6 26.0 - 34.0 pg   MCHC 33.7 32.0 - 36.0 g/dL   RDW 40.915.5 (H) 81.111.5 - 91.414.5 %   Platelets 249 150 - 440 K/uL  Protein / creatinine ratio, urine     Status: None   Collection Time: 11/01/16 12:08 PM  Result Value Ref Range   Creatinine, Urine 169 mg/dL   Total Protein, Urine 26 mg/dL   Protein Creatinine Ratio 0.15 0.00 - 0.15  mg/mg[Cre]  Comprehensive metabolic panel     Status: Abnormal   Collection Time: 11/01/16 12:08 PM  Result Value Ref Range   Sodium 137 135 - 145 mmol/L   Potassium 4.3 3.5 - 5.1 mmol/L   Chloride 107 101 - 111 mmol/L   CO2 22 22 - 32 mmol/L   Glucose, Bld 72 65 - 99 mg/dL   BUN 9 6 - 20 mg/dL   Creatinine, Ser 7.820.53 0.44 - 1.00 mg/dL   Calcium 9.1 8.9 - 95.610.3 mg/dL   Total Protein 6.6 6.5 - 8.1 g/dL   Albumin 2.9 (L) 3.5 - 5.0 g/dL   AST 17 15 - 41 U/L   ALT 17 14 - 54 U/L   Alkaline Phosphatase 87 38 - 126 U/L   Total Bilirubin 0.6 0.3 - 1.2 mg/dL   GFR calc non Af Amer >60 >60 mL/min   GFR calc Af Amer >60 >60 mL/min   Anion gap 8 5 - 15   A: Labile blood pressures, most normal, some in the mild range with edema Normal PIH labs Probable gestational hypertension. No evidence of preeclampsia Cat 1 FHR tracing/ reactive NST  P: DC home with preeclampsia precautions Work excuse thru 7/18 [facility] Avoid prolonged standing, walking Elevate legs to the level of the heart twice daily x 30 minutes    Procedures: none  Discharge Condition: stable  Disposition: 01-Home or Self Care  Diet: Regular diet  Discharge Activity: Ambulate in house   Allergies as of 11/01/2016      Reactions   Flagyl [metronidazole] Rash   Keflex [cephalexin] Rash   Septra [sulfamethoxazole-trimethoprim] Rash      Medication List    TAKE these medications   levothyroxine 50 MCG tablet Commonly known as:  SYNTHROID, LEVOTHROID Take 1 tablet (50 mcg total) by mouth daily before breakfast.   Magnesium 250 MG Tabs Take by mouth.   PRENATAL 1 PO Take by mouth.        Total time spent taking care of this patient: 20 minutes  Signed: Farrel Connersolleen Charle Clear 11/01/2016, 1:57 PM

## 2016-11-03 ENCOUNTER — Ambulatory Visit (INDEPENDENT_AMBULATORY_CARE_PROVIDER_SITE_OTHER): Payer: 59 | Admitting: Certified Nurse Midwife

## 2016-11-03 VITALS — BP 142/72 | Wt 249.0 lb

## 2016-11-03 DIAGNOSIS — Z3A35 35 weeks gestation of pregnancy: Secondary | ICD-10-CM

## 2016-11-03 DIAGNOSIS — O0990 Supervision of high risk pregnancy, unspecified, unspecified trimester: Secondary | ICD-10-CM

## 2016-11-03 DIAGNOSIS — O133 Gestational [pregnancy-induced] hypertension without significant proteinuria, third trimester: Secondary | ICD-10-CM

## 2016-11-03 NOTE — Progress Notes (Signed)
Pt c/o feeling "tight and puffy" had BP checked at work Monday 7/16, 140s/70s. BP check today per [facility].

## 2016-11-07 NOTE — Progress Notes (Signed)
Seen in L&D on 7/16 for elevated blood pressures. Most BPs were normal but some mild elevations of systolics to 140s Baby reactive and labs were normal. Has gestational hypertension Consulted Dr Bonney AidStaebler: Scheduled IOL 2 August (37wk2d) for gestational hypertension NST/ growth scan scheduled for 11/08/2016 Reviewed preeclampsia precautions Start maternity disability.

## 2016-11-08 ENCOUNTER — Ambulatory Visit (INDEPENDENT_AMBULATORY_CARE_PROVIDER_SITE_OTHER): Payer: 59

## 2016-11-08 ENCOUNTER — Ambulatory Visit (INDEPENDENT_AMBULATORY_CARE_PROVIDER_SITE_OTHER): Payer: 59 | Admitting: Obstetrics and Gynecology

## 2016-11-08 VITALS — BP 142/80 | Wt 248.0 lb

## 2016-11-08 DIAGNOSIS — O0990 Supervision of high risk pregnancy, unspecified, unspecified trimester: Secondary | ICD-10-CM | POA: Diagnosis not present

## 2016-11-08 DIAGNOSIS — O99810 Abnormal glucose complicating pregnancy: Secondary | ICD-10-CM

## 2016-11-08 DIAGNOSIS — Z3685 Encounter for antenatal screening for Streptococcus B: Secondary | ICD-10-CM

## 2016-11-08 DIAGNOSIS — Z3689 Encounter for other specified antenatal screening: Secondary | ICD-10-CM

## 2016-11-08 DIAGNOSIS — O133 Gestational [pregnancy-induced] hypertension without significant proteinuria, third trimester: Secondary | ICD-10-CM

## 2016-11-08 DIAGNOSIS — O99210 Obesity complicating pregnancy, unspecified trimester: Secondary | ICD-10-CM

## 2016-11-08 NOTE — Progress Notes (Signed)
NST ***  GBS ***

## 2016-11-08 NOTE — Progress Notes (Signed)
EFW 6lbs 5oz 23%ile AC at 94%ile, AFI 25 Baseline: 155 Variability: moderate Accelerations: present Decelerations: absent Tocometry: N/A The patient was monitored for 20 minutes, fetal heart rate tracing was deemed reactive, category I tracing,

## 2016-11-09 LAB — CBC
HEMOGLOBIN: 11.6 g/dL (ref 11.1–15.9)
Hematocrit: 35.4 % (ref 34.0–46.6)
MCH: 26 pg — ABNORMAL LOW (ref 26.6–33.0)
MCHC: 32.8 g/dL (ref 31.5–35.7)
MCV: 79 fL (ref 79–97)
Platelets: 273 10*3/uL (ref 150–379)
RBC: 4.46 x10E6/uL (ref 3.77–5.28)
RDW: 15.3 % (ref 12.3–15.4)
WBC: 8.7 10*3/uL (ref 3.4–10.8)

## 2016-11-09 LAB — COMPREHENSIVE METABOLIC PANEL
ALK PHOS: 98 IU/L (ref 39–117)
ALT: 14 IU/L (ref 0–32)
AST: 11 IU/L (ref 0–40)
Albumin/Globulin Ratio: 1.3 (ref 1.2–2.2)
Albumin: 3.5 g/dL (ref 3.5–5.5)
BUN/Creatinine Ratio: 12 (ref 9–23)
BUN: 8 mg/dL (ref 6–20)
Bilirubin Total: 0.3 mg/dL (ref 0.0–1.2)
CO2: 20 mmol/L (ref 20–29)
CREATININE: 0.68 mg/dL (ref 0.57–1.00)
Calcium: 9.5 mg/dL (ref 8.7–10.2)
Chloride: 103 mmol/L (ref 96–106)
GFR calc Af Amer: 136 mL/min/{1.73_m2} (ref >59)
GFR calc non Af Amer: 118 mL/min/{1.73_m2} (ref >59)
GLUCOSE: 73 mg/dL (ref 65–99)
Globulin, Total: 2.7 g/dL (ref 1.5–4.5)
Potassium: 4.5 mmol/L (ref 3.5–5.2)
Sodium: 138 mmol/L (ref 134–144)
Total Protein: 6.2 g/dL (ref 6.0–8.5)

## 2016-11-09 LAB — PROTEIN / CREATININE RATIO, URINE
CREATININE, UR: 217.8 mg/dL
Protein, Ur: 27.7 mg/dL
Protein/Creat Ratio: 127 mg/g creat (ref 0–200)

## 2016-11-10 ENCOUNTER — Encounter: Payer: 59 | Admitting: Advanced Practice Midwife

## 2016-11-10 ENCOUNTER — Other Ambulatory Visit: Payer: 59

## 2016-11-12 ENCOUNTER — Encounter: Payer: Self-pay | Admitting: Obstetrics and Gynecology

## 2016-11-12 LAB — STREP GP B CULTURE+RFLX: STREP GP B CULTURE+RFLX: NEGATIVE

## 2016-11-15 ENCOUNTER — Ambulatory Visit (INDEPENDENT_AMBULATORY_CARE_PROVIDER_SITE_OTHER): Payer: 59 | Admitting: Advanced Practice Midwife

## 2016-11-15 ENCOUNTER — Ambulatory Visit (INDEPENDENT_AMBULATORY_CARE_PROVIDER_SITE_OTHER): Payer: 59

## 2016-11-15 VITALS — BP 138/88 | Wt 245.0 lb

## 2016-11-15 DIAGNOSIS — O0990 Supervision of high risk pregnancy, unspecified, unspecified trimester: Secondary | ICD-10-CM | POA: Diagnosis not present

## 2016-11-15 DIAGNOSIS — Z3A36 36 weeks gestation of pregnancy: Secondary | ICD-10-CM | POA: Diagnosis not present

## 2016-11-15 DIAGNOSIS — O99210 Obesity complicating pregnancy, unspecified trimester: Secondary | ICD-10-CM | POA: Diagnosis not present

## 2016-11-15 DIAGNOSIS — O133 Gestational [pregnancy-induced] hypertension without significant proteinuria, third trimester: Secondary | ICD-10-CM

## 2016-11-15 NOTE — Patient Instructions (Signed)
Labor Induction Labor induction is when steps are taken to cause a pregnant woman to begin the labor process. Most women go into labor on their own between 37 weeks and 42 weeks of the pregnancy. When this does not happen or when there is a medical need, methods may be used to induce labor. Labor induction causes a pregnant woman's uterus to contract. It also causes the cervix to soften (ripen), open (dilate), and thin out (efface). Usually, labor is not induced before 39 weeks of the pregnancy unless there is a problem with the baby or mother. Before inducing labor, your health care provider will consider a number of factors, including the following:  The medical condition of you and the baby.  How many weeks along you are.  The status of the baby's lung maturity.  The condition of the cervix.  The position of the baby. What are the reasons for labor induction? Labor may be induced for the following reasons:  The health of the baby or mother is at risk.  The pregnancy is overdue by 1 week or more.  The water breaks but labor does not start on its own.  The mother has a health condition or serious illness, such as high blood pressure, infection, placental abruption, or diabetes.  The amniotic fluid amounts are low around the baby.  The baby is distressed. Convenience or wanting the baby to be born on a certain date is not a reason for inducing labor. What methods are used for labor induction? Several methods of labor induction may be used, such as:  Prostaglandin medicine. This medicine causes the cervix to dilate and ripen. The medicine will also start contractions. It can be taken by mouth or by inserting a suppository into the vagina.  Inserting a thin tube (catheter) with a balloon on the end into the vagina to dilate the cervix. Once inserted, the balloon is expanded with water, which causes the cervix to open.  Stripping the membranes. Your health care provider separates  amniotic sac tissue from the cervix, causing the cervix to be stretched and causing the release of a hormone called progesterone. This may cause the uterus to contract. It is often done during an office visit. You will be sent home to wait for the contractions to begin. You will then come in for an induction.  Breaking the water. Your health care provider makes a hole in the amniotic sac using a small instrument. Once the amniotic sac breaks, contractions should begin. This may still take hours to see an effect.  Medicine to trigger or strengthen contractions. This medicine is given through an IV access tube inserted into a vein in your arm. All of the methods of induction, besides stripping the membranes, will be done in the hospital. Induction is done in the hospital so that you and the baby can be carefully monitored. How long does it take for labor to be induced? Some inductions can take up to 2-3 days. Depending on the cervix, it usually takes less time. It takes longer when you are induced early in the pregnancy or if this is your first pregnancy. If a mother is still pregnant and the induction has been going on for 2-3 days, either the mother will be sent home or a cesarean delivery will be needed. What are the risks associated with labor induction? Some of the risks of induction include:  Changes in fetal heart rate, such as too high, too low, or erratic.  Fetal distress.    Chance of infection for the mother and baby.  Increased chance of having a cesarean delivery.  Breaking off (abruption) of the placenta from the uterus (rare).  Uterine rupture (very rare). When induction is needed for medical reasons, the benefits of induction may outweigh the risks. What are some reasons for not inducing labor? Labor induction should not be done if:  It is shown that your baby does not tolerate labor.  You have had previous surgeries on your uterus, such as a myomectomy or the removal of  fibroids.  Your placenta lies very low in the uterus and blocks the opening of the cervix (placenta previa).  Your baby is not in a head-down position.  The umbilical cord drops down into the birth canal in front of the baby. This could cut off the baby's blood and oxygen supply.  You have had a previous cesarean delivery.  There are unusual circumstances, such as the baby being extremely premature. This information is not intended to replace advice given to you by your health care provider. Make sure you discuss any questions you have with your health care provider. Document Released: 08/25/2006 Document Revised: 09/11/2015 Document Reviewed: 11/02/2012 Elsevier Interactive Patient Education  2017 Elsevier Inc.  

## 2016-11-15 NOTE — Progress Notes (Signed)
NST reactive today. Baseline 145 bpm, moderate variability, +accelerations, 1 variable noted. 20 minute NST AFI 18 cm, denies LOF, VB Patient has IOL scheduled for 8/2 AM

## 2016-11-15 NOTE — Progress Notes (Signed)
Afi/ nst today. No vb. No lof 

## 2016-11-16 ENCOUNTER — Encounter: Payer: Self-pay | Admitting: Certified Nurse Midwife

## 2016-11-18 ENCOUNTER — Inpatient Hospital Stay
Admission: EM | Admit: 2016-11-18 | Discharge: 2016-11-22 | DRG: 775 | Disposition: A | Discharge status: Home / Self Care | Payer: 59 | Attending: Obstetrics and Gynecology | Admitting: Obstetrics and Gynecology

## 2016-11-18 DIAGNOSIS — F329 Major depressive disorder, single episode, unspecified: Secondary | ICD-10-CM | POA: Diagnosis present

## 2016-11-18 DIAGNOSIS — Z880 Allergy status to penicillin: Secondary | ICD-10-CM | POA: Diagnosis not present

## 2016-11-18 DIAGNOSIS — O99214 Obesity complicating childbirth: Secondary | ICD-10-CM | POA: Diagnosis present

## 2016-11-18 DIAGNOSIS — Z881 Allergy status to other antibiotic agents status: Secondary | ICD-10-CM | POA: Diagnosis not present

## 2016-11-18 DIAGNOSIS — E669 Obesity, unspecified: Secondary | ICD-10-CM | POA: Diagnosis present

## 2016-11-18 DIAGNOSIS — O139 Gestational [pregnancy-induced] hypertension without significant proteinuria, unspecified trimester: Secondary | ICD-10-CM | POA: Diagnosis present

## 2016-11-18 DIAGNOSIS — Z6841 Body Mass Index (BMI) 40.0 and over, adult: Secondary | ICD-10-CM

## 2016-11-18 DIAGNOSIS — E039 Hypothyroidism, unspecified: Secondary | ICD-10-CM | POA: Diagnosis present

## 2016-11-18 DIAGNOSIS — O99810 Abnormal glucose complicating pregnancy: Secondary | ICD-10-CM

## 2016-11-18 DIAGNOSIS — O134 Gestational [pregnancy-induced] hypertension without significant proteinuria, complicating childbirth: Secondary | ICD-10-CM | POA: Diagnosis not present

## 2016-11-18 DIAGNOSIS — O99284 Endocrine, nutritional and metabolic diseases complicating childbirth: Secondary | ICD-10-CM | POA: Diagnosis present

## 2016-11-18 DIAGNOSIS — O133 Gestational [pregnancy-induced] hypertension without significant proteinuria, third trimester: Secondary | ICD-10-CM

## 2016-11-18 DIAGNOSIS — N760 Acute vaginitis: Secondary | ICD-10-CM

## 2016-11-18 DIAGNOSIS — O0990 Supervision of high risk pregnancy, unspecified, unspecified trimester: Secondary | ICD-10-CM

## 2016-11-18 DIAGNOSIS — F32A Depression, unspecified: Secondary | ICD-10-CM | POA: Diagnosis present

## 2016-11-18 DIAGNOSIS — O165 Unspecified maternal hypertension, complicating the puerperium: Secondary | ICD-10-CM | POA: Diagnosis present

## 2016-11-18 DIAGNOSIS — Z3A37 37 weeks gestation of pregnancy: Secondary | ICD-10-CM

## 2016-11-18 DIAGNOSIS — B9689 Other specified bacterial agents as the cause of diseases classified elsewhere: Secondary | ICD-10-CM

## 2016-11-18 HISTORY — DX: Obesity, unspecified: E66.9

## 2016-11-18 LAB — COMPREHENSIVE METABOLIC PANEL
ALBUMIN: 2.9 g/dL — AB (ref 3.5–5.0)
ALT: 16 U/L (ref 14–54)
ANION GAP: 8 (ref 5–15)
AST: 20 U/L (ref 15–41)
Alkaline Phosphatase: 90 U/L (ref 38–126)
BUN: 10 mg/dL (ref 6–20)
CO2: 21 mmol/L — AB (ref 22–32)
Calcium: 9.1 mg/dL (ref 8.9–10.3)
Chloride: 107 mmol/L (ref 101–111)
Creatinine, Ser: 0.66 mg/dL (ref 0.44–1.00)
GFR calc Af Amer: >60 mL/min (ref >60)
GFR calc non Af Amer: >60 mL/min (ref >60)
GLUCOSE: 123 mg/dL — AB (ref 65–99)
POTASSIUM: 4.1 mmol/L (ref 3.5–5.1)
SODIUM: 136 mmol/L (ref 135–145)
Total Bilirubin: 0.5 mg/dL (ref 0.3–1.2)
Total Protein: 6.4 g/dL — ABNORMAL LOW (ref 6.5–8.1)

## 2016-11-18 LAB — CBC
HCT: 34.8 % — ABNORMAL LOW (ref 35.0–47.0)
HEMOGLOBIN: 11.8 g/dL — AB (ref 12.0–16.0)
MCH: 26.7 pg (ref 26.0–34.0)
MCHC: 33.8 g/dL (ref 32.0–36.0)
MCV: 79 fL — AB (ref 80.0–100.0)
PLATELETS: 259 10*3/uL (ref 150–440)
RBC: 4.41 MIL/uL (ref 3.80–5.20)
RDW: 16.1 % — ABNORMAL HIGH (ref 11.5–14.5)
WBC: 10.1 10*3/uL (ref 3.6–11.0)

## 2016-11-18 LAB — TYPE AND SCREEN
ABO/RH(D): A POS
Antibody Screen: NEGATIVE

## 2016-11-18 LAB — PROTEIN / CREATININE RATIO, URINE
CREATININE, URINE: 363 mg/dL
Protein Creatinine Ratio: 0.2 mg/mg{Cre} — ABNORMAL HIGH (ref 0.00–0.15)
TOTAL PROTEIN, URINE: 72 mg/dL

## 2016-11-18 MED ORDER — OXYTOCIN 40 UNITS IN LACTATED RINGERS INFUSION - SIMPLE MED
1.0000 m[IU]/min | INTRAVENOUS | Status: DC
  Administered 2016-11-18: 2 m[IU]/min via INTRAVENOUS
  Filled 2016-11-18: qty 1000

## 2016-11-18 MED ORDER — LACTATED RINGERS IV SOLN
INTRAVENOUS | Status: DC
  Administered 2016-11-18 – 2016-11-19 (×3): via INTRAVENOUS

## 2016-11-18 MED ORDER — LACTATED RINGERS IV SOLN
500.0000 mL | INTRAVENOUS | Status: DC | PRN
  Administered 2016-11-19 (×2): 500 mL via INTRAVENOUS

## 2016-11-18 MED ORDER — MISOPROSTOL 200 MCG PO TABS
800.0000 ug | ORAL_TABLET | Freq: Once | ORAL | Status: DC | PRN
  Filled 2016-11-18: qty 4

## 2016-11-18 MED ORDER — TERBUTALINE SULFATE 1 MG/ML IJ SOLN: 0.2500 mg | Freq: Once | INTRAMUSCULAR | Status: DC | PRN

## 2016-11-18 MED ORDER — LIDOCAINE HCL (PF) 1 % IJ SOLN
30.0000 mL | INTRAMUSCULAR | Status: DC | PRN
  Filled 2016-11-18: qty 30

## 2016-11-18 MED ORDER — MISOPROSTOL 25 MCG QUARTER TABLET
ORAL_TABLET | ORAL | Status: AC
  Administered 2016-11-18: 25 ug via ORAL
  Filled 2016-11-18: qty 1

## 2016-11-18 MED ORDER — AMMONIA AROMATIC IN INHA
0.3000 mL | Freq: Once | RESPIRATORY_TRACT | Status: DC | PRN
  Filled 2016-11-18: qty 10

## 2016-11-18 MED ORDER — OXYTOCIN BOLUS FROM INFUSION: 500.0000 mL | Freq: Once | INTRAVENOUS | Status: DC

## 2016-11-18 MED ORDER — MISOPROSTOL 25 MCG QUARTER TABLET
25.0000 ug | ORAL_TABLET | ORAL | Status: DC | PRN
  Administered 2016-11-18 (×2): 25 ug via ORAL
  Filled 2016-11-18: qty 1

## 2016-11-18 MED ORDER — STERILE WATER FOR INJECTION IJ SOLN
INTRAMUSCULAR | Status: AC
  Filled 2016-11-18: qty 50

## 2016-11-18 MED ORDER — ONDANSETRON HCL 4 MG/2ML IJ SOLN: 4.0000 mg | Freq: Four times a day (QID) | INTRAMUSCULAR | Status: DC | PRN

## 2016-11-18 MED ORDER — OXYTOCIN 40 UNITS IN LACTATED RINGERS INFUSION - SIMPLE MED
2.5000 [IU]/h | INTRAVENOUS | Status: DC
  Filled 2016-11-18: qty 1000

## 2016-11-18 NOTE — H&P (Signed)
OB History & Physical   History of Present Illness:  Chief Complaint:  I am here to have my baby HPI:  Cynthia George is a 31 y.o. G1P0 female with EDC=12/07/2016 at [redacted]w[redacted]d dated by LMP on 03/02/2016.  Her pregnancy has been complicated by gestational hypertension, hypothyroidism, obesity, and a history of depression.  She has PCOS and conceived on Clomid. Both early and 28 week glucolas were elevated, but 3 hour GTTs were normal.  She was begun on levothyroxine this pregnancy when TSH was elevated at 4.180. She currently takes 50 mcg of levothyroxine daily. She presents to L&D for an induction of labor for gestational hypertension diagnosed at [redacted] weeks gestation with blood pressures in the mild range and normal labs. Has been followed with NSTs and growth scans/AFIs for gestational hypertension and obesity with BMI>40. Non stress tests have been reactive. Last growth scan on 7/23 with EFW 6#5oz 2857g, 53%ile.  Baby has been active. Contractions are mild and irregular. No vaginal bleeding or leakage of fluid. Denies headaches, chest pain, SOB, nausea/vomiting or RUQ pain. Has edema in lower extremities and fingers.  Prenatal care: Clinic [facility] Prenatal Labs  Dating LMP Blood type: A/Positive/-- (01/12 0000)   Genetic Screen 1 Screen:    AFP:     Quad:     NIPS: Antibody:Negative (01/12 0000)  Anatomic [place] Complete 09/01/16 Rubella: Immune (01/12 0000)  GTT Early:    148/ 3hr WNL Third trimester: 168/wnl RPR: Nonreactive (01/12 0000)   Flu vaccine Not received HBsAg: Negative (01/12 0000)   TDaP vaccine  09/29/16                                        Rhogam:N/A HIV: Non-reactive (01/12 0000)   Baby Food  Breast                                        GBS: (For PCN allergy, check sensitivities) Negative  Contraception minipill Varicella:immune       Maternal Medical History:   Past Medical History:  Diagnosis Date  . Anxiety   . Depression   . Hyperlipidemia   . Migraines   . Obesity   .  PCOS (polycystic ovarian syndrome)     Past Surgical History:  Procedure Laterality Date  . APPENDECTOMY  2013  . HUMERUS FRACTURE SURGERY Left 2002,2003   rod and screws placed and removed  . TONSILLECTOMY AND ADENOIDECTOMY  2004  . WISDOM TOOTH EXTRACTION  2011   All    Allergies  Allergen Reactions  . Flagyl [Metronidazole] Rash  . Keflex [Cephalexin] Rash  . Septra [Sulfamethoxazole-Trimethoprim] Rash    Prior to Admission medications   Medication Sig Start Date End Date Taking? Authorizing Provider  levothyroxine (SYNTHROID, LEVOTHROID) 50 MCG tablet Take 1 tablet (50 mcg total) by mouth daily before breakfast. 10/06/16  Yes Vena Austria, MD  Magnesium 250 MG TABS Take by mouth.   Yes [provider]  Prenatal MV-Min-Fe Fum-FA-DHA (PRENATAL 1 PO) Take by mouth.   Yes [provider]     Social History: She  reports that she has never smoked. She has never used smokeless tobacco. She reports that she does not drink alcohol or use drugs.  Family History: family history includes Asthma in her father and sister;  Diabetes in her father, maternal grandfather, maternal grandmother, and paternal grandmother; Heart attack in her maternal grandfather, mother, and paternal grandfather; Heart attack (age of onset: 7542) in her father; Heart disease in her mother; Hyperlipidemia in her father, maternal grandmother, mother, and sister; Hypertension in her father, maternal grandfather, maternal grandmother, and mother; Mental illness in her brother and sister; Polycystic ovary syndrome in her sister.   Review of Systems: Negative x 10 systems reviewed except as noted in the HPI.      Physical Exam:  Vital Signs: BP (!) 147/84 (BP Location: Left Arm)   Pulse 82   Temp 98.1 F (36.7 C) (Oral)   Resp 18   Ht 5\' 3"  (1.6 m)   Wt 111.1 kg (245 lb)   LMP 03/02/2016 (Exact Date)   SpO2 100%   BMI 43.40 kg/m  General: gravid WF in no acute distress.  HEENT:  normocephalic, atraumatic Heart: regular rate & rhythm.  No murmurs Lungs: clear to auscultation bilaterally Abdomen: soft, gravid, non-tender;  EFW:7#14oz Pelvic:   External: Normal external female genitalia  Cervix: Dilation: Fingertip / Effacement (%): 70 / Station: -2, -1   Pelvimetry: small outlet noted  Extremities: non-tender, symmetric, +1 edema bilaterally.  DTRs: +1  Neurologic: Alert & oriented x 3.    Baseline FHR: 145-150 with accelerations to 160s to 170, moderate variability, no decelerations Toco: occasional contraction    Results for orders placed or performed during the hospital encounter of 11/18/16 (from the past 24 hour(s))  CBC     Status: Abnormal   Collection Time: 11/18/16  8:45 AM  Result Value Ref Range   WBC 10.1 3.6 - 11.0 K/uL   RBC 4.41 3.80 - 5.20 MIL/uL   Hemoglobin 11.8 (L) 12.0 - 16.0 g/dL   HCT 82.934.8 (L) 56.235.0 - 13.047.0 %   MCV 79.0 (L) 80.0 - 100.0 fL   MCH 26.7 26.0 - 34.0 pg   MCHC 33.8 32.0 - 36.0 g/dL   RDW 86.516.1 (H) 78.411.5 - 69.614.5 %   Platelets 259 150 - 440 K/uL  Type and screen [facility]     Status: None   Collection Time: 11/18/16  8:45 AM  Result Value Ref Range   ABO/RH(D) A POS    Antibody Screen NEG    Sample Expiration 11/21/2016   Protein / creatinine ratio, urine     Status: Abnormal   Collection Time: 11/18/16  8:45 AM  Result Value Ref Range   Creatinine, Urine 363 mg/dL   Total Protein, Urine 72 mg/dL   Protein Creatinine Ratio 0.20 (H) 0.00 - 0.15 mg/mg[Cre]  Comprehensive metabolic panel     Status: Abnormal   Collection Time: 11/18/16 10:49 AM  Result Value Ref Range   Sodium 136 135 - 145 mmol/L   Potassium 4.1 3.5 - 5.1 mmol/L   Chloride 107 101 - 111 mmol/L   CO2 21 (L) 22 - 32 mmol/L   Glucose, Bld 123 (H) 65 - 99 mg/dL   BUN 10 6 - 20 mg/dL   Creatinine, Ser 2.950.66 0.44 - 1.00 mg/dL   Calcium 9.1 8.9 - 28.410.3 mg/dL   Total Protein 6.4 (L) 6.5 - 8.1 g/dL   Albumin 2.9 (L) 3.5 - 5.0 g/dL   AST  20 15 - 41 U/L   ALT 16 14 - 54 U/L   Alkaline Phosphatase 90 38 - 126 U/L   Total Bilirubin 0.5 0.3 - 1.2 mg/dL   GFR calc non Af  Amer >60 >60 mL/min   GFR calc Af Amer >60 >60 mL/min   Anion gap 8 5 - 15    Assessment:  Cynthia George is a 31 y.o. G1P0 female at 4470w2d with gestational hypertension for IOL   FWB: Cat 1 tracing Plan:  1. Admit to Labor & Delivery  -discussed induction process using foley bulb, Cytotec or Pitocin. Explained risks of hyperstimulation, failure to progress, FITL and Cesarean section. Plan foley bulb and either Cytotec or Pitocin. Consents signed 2. CBC, T&S, Clrs, IVF, CMP, PC ratio 3. GBS negative.   4. Monitor blood pressures closely 5. Breast/ ?minipill 6. TDAP UTD 7. A POS/ RI/ VI  Farrel ConnersColleen Gutierrez  11/18/2016 8:49 AM

## 2016-11-18 NOTE — Progress Notes (Signed)
Subjective:  Comfortable, feeling contraction but not painfully  Objective:   Vitals: Blood pressure (!) 111/57, pulse 72, temperature 98.3 F (36.8 C), temperature source Oral, resp. rate 16, height 5\' 3"  (1.6 m), weight 245 lb (111.1 kg), last menstrual period 03/02/2016, SpO2 100 %. General: NAD Abdomen: gravid, non-tender Cervical Exam:  Dilation: Fingertip Effacement (%): 70 Cervical Position: Middle Station: -2, -1 Presentation: Vertex Exam by:: Farrel Connersolleen Gutierrez CNM  FHT: 130, moderate, +accels, no decels Toco: q3-794min  Results for orders placed or performed during the hospital encounter of 11/18/16 (from the past 24 hour(s))  CBC     Status: Abnormal   Collection Time: 11/18/16  8:45 AM  Result Value Ref Range   WBC 10.1 3.6 - 11.0 K/uL   RBC 4.41 3.80 - 5.20 MIL/uL   Hemoglobin 11.8 (L) 12.0 - 16.0 g/dL   HCT 40.934.8 (L) 81.135.0 - 91.447.0 %   MCV 79.0 (L) 80.0 - 100.0 fL   MCH 26.7 26.0 - 34.0 pg   MCHC 33.8 32.0 - 36.0 g/dL   RDW 78.216.1 (H) 95.611.5 - 21.314.5 %   Platelets 259 150 - 440 K/uL  Type and screen [facility]     Status: None   Collection Time: 11/18/16  8:45 AM  Result Value Ref Range   ABO/RH(D) A POS    Antibody Screen NEG    Sample Expiration 11/21/2016   Protein / creatinine ratio, urine     Status: Abnormal   Collection Time: 11/18/16  8:45 AM  Result Value Ref Range   Creatinine, Urine 363 mg/dL   Total Protein, Urine 72 mg/dL   Protein Creatinine Ratio 0.20 (H) 0.00 - 0.15 mg/mg[Cre]  Comprehensive metabolic panel     Status: Abnormal   Collection Time: 11/18/16 10:49 AM  Result Value Ref Range   Sodium 136 135 - 145 mmol/L   Potassium 4.1 3.5 - 5.1 mmol/L   Chloride 107 101 - 111 mmol/L   CO2 21 (L) 22 - 32 mmol/L   Glucose, Bld 123 (H) 65 - 99 mg/dL   BUN 10 6 - 20 mg/dL   Creatinine, Ser 0.860.66 0.44 - 1.00 mg/dL   Calcium 9.1 8.9 - 57.810.3 mg/dL   Total Protein 6.4 (L) 6.5 - 8.1 g/dL   Albumin 2.9 (L) 3.5 - 5.0 g/dL   AST 20 15 -  41 U/L   ALT 16 14 - 54 U/L   Alkaline Phosphatase 90 38 - 126 U/L   Total Bilirubin 0.5 0.3 - 1.2 mg/dL   GFR calc non Af Amer >60 >60 mL/min   GFR calc Af Amer >60 >60 mL/min   Anion gap 8 5 - 15    Assessment:   31 y.o. G1P0 2944w2d IOL GHTN  Plan:   1) Labor - contracting to much for more cytotec, switch to pitocin, intracervical foley catheter remains in place  2) Fetus - cat I tracing - Growth 11/08/16 6lbs 5oz 53%ile  3) GHTN - normotensive to mild range BPs, normal labs on admission

## 2016-11-19 ENCOUNTER — Inpatient Hospital Stay: Payer: 59 | Admitting: Anesthesiology

## 2016-11-19 LAB — RPR: RPR: NONREACTIVE

## 2016-11-19 MED ORDER — DIPHENHYDRAMINE HCL 50 MG/ML IJ SOLN: 12.5000 mg | INTRAMUSCULAR | Status: DC | PRN

## 2016-11-19 MED ORDER — PHENYLEPHRINE 40 MCG/ML (10ML) SYRINGE FOR IV PUSH (FOR BLOOD PRESSURE SUPPORT): 80.0000 ug | PREFILLED_SYRINGE | INTRAVENOUS | Status: DC | PRN

## 2016-11-19 MED ORDER — FENTANYL 2.5 MCG/ML W/ROPIVACAINE 0.15% IN NS 100 ML EPIDURAL (ARMC)
EPIDURAL | Status: DC | PRN
  Administered 2016-11-19: 12 mL/h via EPIDURAL

## 2016-11-19 MED ORDER — FENTANYL 2.5 MCG/ML W/ROPIVACAINE 0.15% IN NS 100 ML EPIDURAL (ARMC)
EPIDURAL | Status: AC
  Filled 2016-11-19: qty 100

## 2016-11-19 MED ORDER — BUTORPHANOL TARTRATE 2 MG/ML IJ SOLN
INTRAMUSCULAR | Status: AC
  Administered 2016-11-19: 2 mg via INTRAVENOUS
  Filled 2016-11-19: qty 1

## 2016-11-19 MED ORDER — EPHEDRINE 5 MG/ML INJ: 10.0000 mg | INTRAVENOUS | Status: DC | PRN

## 2016-11-19 MED ORDER — LIDOCAINE-EPINEPHRINE (PF) 1.5 %-1:200000 IJ SOLN
INTRAMUSCULAR | Status: DC | PRN
  Administered 2016-11-19: 3 mL via EPIDURAL

## 2016-11-19 MED ORDER — BUTORPHANOL TARTRATE 2 MG/ML IJ SOLN
1.0000 mg | INTRAMUSCULAR | Status: DC | PRN
  Administered 2016-11-19: 2 mg via INTRAVENOUS

## 2016-11-19 MED ORDER — FENTANYL 2.5 MCG/ML W/ROPIVACAINE 0.15% IN NS 100 ML EPIDURAL (ARMC)
12.0000 mL/h | EPIDURAL | Status: DC
  Administered 2016-11-19: 12 mL/h via EPIDURAL
  Filled 2016-11-19: qty 100

## 2016-11-19 MED ORDER — LIDOCAINE HCL (PF) 1 % IJ SOLN
INTRAMUSCULAR | Status: DC | PRN
  Administered 2016-11-19: 3 mL via SUBCUTANEOUS

## 2016-11-19 MED ORDER — LACTATED RINGERS IV SOLN: 500.0000 mL | Freq: Once | INTRAVENOUS | Status: DC

## 2016-11-19 MED ORDER — EPHEDRINE 5 MG/ML INJ: 15.0000 mg | Freq: Once | INTRAVENOUS | Status: DC

## 2016-11-19 MED ORDER — BUPIVACAINE HCL (PF) 0.25 % IJ SOLN
INTRAMUSCULAR | Status: DC | PRN
  Administered 2016-11-19 (×2): 4 mL via EPIDURAL

## 2016-11-19 NOTE — Anesthesia Preprocedure Evaluation (Signed)
Anesthesia Evaluation  Patient identified by MRN, date of birth, ID band Patient awake    Reviewed: Allergy & Precautions, H&P , NPO status , Patient's Chart, lab work & pertinent test results  History of Anesthesia Complications Negative for: history of anesthetic complications  Airway Mallampati: III       Dental  (+) Teeth Intact   Pulmonary           Cardiovascular hypertension,      Neuro/Psych  Headaches,    GI/Hepatic   Endo/Other  Hypothyroidism   Renal/GU      Musculoskeletal   Abdominal   Peds  Hematology   Anesthesia Other Findings   Reproductive/Obstetrics (+) Pregnancy                             Anesthesia Physical Anesthesia Plan  ASA: III  Anesthesia Plan: Epidural   Post-op Pain Management:    Induction:   PONV Risk Score and Plan:   Airway Management Planned:   Additional Equipment:   Intra-op Plan:   Post-operative Plan:   Informed Consent: I have reviewed the patients History and Physical, chart, labs and discussed the procedure including the risks, benefits and alternatives for the proposed anesthesia with the patient or authorized representative who has indicated his/her understanding and acceptance.     Plan Discussed with: Anesthesiologist  Anesthesia Plan Comments:         Anesthesia Quick Evaluation

## 2016-11-19 NOTE — Anesthesia Procedure Notes (Signed)
Epidural  Start time: 11/19/2016 10:22 AM End time: 11/19/2016 10:40 AM  Staffing Anesthesiologist: Yves DillARROLL, PAUL Resident/CRNA: Irving BurtonBACHICH, Cymone Yeske Performed: resident/CRNA   Preanesthetic Checklist Completed: patient identified, site marked, surgical consent, pre-op evaluation, IV checked, risks and benefits discussed and monitors and equipment checked  Epidural Patient position: sitting Prep: ChloraPrep Patient monitoring: heart rate, continuous pulse ox and blood pressure Approach: midline Location: L3-L4 Injection technique: LOR air  Needle:  Needle type: Tuohy  Needle gauge: 17 G Needle length: 9 cm Needle insertion depth: 8 cm Catheter type: closed end flexible Catheter size: 19 Gauge Catheter at skin depth: 12 cm Test dose: negative and 1.5% lidocaine with Epi 1:200 K  Assessment Events: blood not aspirated, injection not painful, no injection resistance, negative IV test and no paresthesia  Additional Notes Reason for block:procedure for pain

## 2016-11-19 NOTE — Progress Notes (Signed)
Labor Check  Subj:  Complaints: none, comfortable with epidural  Assuming care of patient and have reviewed her labor and hospital course thus far.  Obj:  BP (!) 122/57   Pulse 78   Temp 98.9 F (37.2 C) (Oral)   Resp 16   Ht 5\' 3"  (1.6 m)   Wt 245 lb (111.1 kg)   LMP 03/02/2016 (Exact Date)   SpO2 100%   BMI 43.40 kg/m  Dose (milli-units/min) Oxytocin: 6 milli-units/min  Cervix: Dilation: Lip/rim / Effacement (%): 90 / Station: -1 (per most recent RN exam) Baseline FHR: 135 beats/min   Variability: moderate   Accelerations: present   Decelerations: present (occasional variable deceleration) Contractions: present frequency: 3-4 q 10 min Overall assessment: cat 1 overall, occasionally cat 2  A/P: 31 y.o. G1P0 female at 6287w3d with IOL for gHTN.  1.  Labor: continue pitocin  2.  FWB: reassuring, Overall assessment: category 1  3.  GBS neg  4.  Pain: epidural 5.  Recheck: prn   Thomasene MohairStephen Ellis Koffler, MD 11/19/2016 10:01 PM

## 2016-11-20 DIAGNOSIS — O99214 Obesity complicating childbirth: Secondary | ICD-10-CM | POA: Diagnosis not present

## 2016-11-20 DIAGNOSIS — Z880 Allergy status to penicillin: Secondary | ICD-10-CM | POA: Diagnosis not present

## 2016-11-20 DIAGNOSIS — Z3A37 37 weeks gestation of pregnancy: Secondary | ICD-10-CM

## 2016-11-20 DIAGNOSIS — Z6841 Body Mass Index (BMI) 40.0 and over, adult: Secondary | ICD-10-CM | POA: Diagnosis not present

## 2016-11-20 DIAGNOSIS — E039 Hypothyroidism, unspecified: Secondary | ICD-10-CM | POA: Diagnosis not present

## 2016-11-20 DIAGNOSIS — O99284 Endocrine, nutritional and metabolic diseases complicating childbirth: Secondary | ICD-10-CM | POA: Diagnosis not present

## 2016-11-20 DIAGNOSIS — Z881 Allergy status to other antibiotic agents status: Secondary | ICD-10-CM | POA: Diagnosis not present

## 2016-11-20 DIAGNOSIS — O134 Gestational [pregnancy-induced] hypertension without significant proteinuria, complicating childbirth: Secondary | ICD-10-CM | POA: Diagnosis not present

## 2016-11-20 DIAGNOSIS — E669 Obesity, unspecified: Secondary | ICD-10-CM | POA: Diagnosis not present

## 2016-11-20 LAB — CBC
HEMATOCRIT: 32.8 % — AB (ref 35.0–47.0)
HEMOGLOBIN: 11 g/dL — AB (ref 12.0–16.0)
MCH: 26.6 pg (ref 26.0–34.0)
MCHC: 33.4 g/dL (ref 32.0–36.0)
MCV: 79.7 fL — AB (ref 80.0–100.0)
Platelets: 225 10*3/uL (ref 150–440)
RBC: 4.12 MIL/uL (ref 3.80–5.20)
RDW: 16.4 % — ABNORMAL HIGH (ref 11.5–14.5)
WBC: 17.7 10*3/uL — ABNORMAL HIGH (ref 3.6–11.0)

## 2016-11-20 MED ORDER — ACETAMINOPHEN 325 MG PO TABS: 650.0000 mg | ORAL_TABLET | ORAL | Status: DC | PRN

## 2016-11-20 MED ORDER — IBUPROFEN 600 MG PO TABS
600.0000 mg | ORAL_TABLET | Freq: Four times a day (QID) | ORAL | Status: DC
  Administered 2016-11-20: 600 mg via ORAL
  Filled 2016-11-20: qty 1

## 2016-11-20 MED ORDER — LEVOTHYROXINE SODIUM 50 MCG PO TABS
50.0000 ug | ORAL_TABLET | Freq: Every day | ORAL | Status: DC
  Administered 2016-11-20 – 2016-11-22 (×3): 50 ug via ORAL
  Filled 2016-11-20 (×3): qty 1

## 2016-11-20 MED ORDER — ONDANSETRON HCL 4 MG PO TABS: 4.0000 mg | ORAL_TABLET | ORAL | Status: DC | PRN

## 2016-11-20 MED ORDER — DIPHENHYDRAMINE HCL 25 MG PO CAPS: 25.0000 mg | ORAL_CAPSULE | Freq: Four times a day (QID) | ORAL | Status: DC | PRN

## 2016-11-20 MED ORDER — COCONUT OIL OIL
1.0000 "application " | TOPICAL_OIL | Status: DC | PRN
  Filled 2016-11-20: qty 120

## 2016-11-20 MED ORDER — SENNOSIDES-DOCUSATE SODIUM 8.6-50 MG PO TABS
2.0000 | ORAL_TABLET | ORAL | Status: DC
  Administered 2016-11-21 – 2016-11-22 (×2): 2 via ORAL
  Filled 2016-11-20 (×2): qty 2

## 2016-11-20 MED ORDER — ONDANSETRON HCL 4 MG/2ML IJ SOLN: 4.0000 mg | INTRAMUSCULAR | Status: DC | PRN

## 2016-11-20 MED ORDER — PRENATAL 1 30-0.975-200 MG PO CAPS: 1.0000 | ORAL_CAPSULE | Freq: Every day | ORAL | Status: DC

## 2016-11-20 MED ORDER — SIMETHICONE 80 MG PO CHEW: 80.0000 mg | CHEWABLE_TABLET | ORAL | Status: DC | PRN

## 2016-11-20 MED ORDER — PRENATAL MULTIVITAMIN CH
1.0000 | ORAL_TABLET | Freq: Every day | ORAL | Status: DC
  Administered 2016-11-20 – 2016-11-22 (×3): 1 via ORAL
  Filled 2016-11-20 (×3): qty 1

## 2016-11-20 MED ORDER — BENZOCAINE-MENTHOL 20-0.5 % EX AERO
1.0000 "application " | INHALATION_SPRAY | CUTANEOUS | Status: DC | PRN
  Administered 2016-11-20: 1 via TOPICAL
  Filled 2016-11-20: qty 56

## 2016-11-20 MED ORDER — WITCH HAZEL-GLYCERIN EX PADS: 1.0000 "application " | MEDICATED_PAD | CUTANEOUS | Status: DC | PRN

## 2016-11-20 MED ORDER — IBUPROFEN 600 MG PO TABS
600.0000 mg | ORAL_TABLET | Freq: Four times a day (QID) | ORAL | Status: DC
  Administered 2016-11-20 – 2016-11-22 (×10): 600 mg via ORAL
  Filled 2016-11-20 (×10): qty 1

## 2016-11-20 MED ORDER — FERROUS SULFATE 325 (65 FE) MG PO TABS
325.0000 mg | ORAL_TABLET | Freq: Two times a day (BID) | ORAL | Status: DC
  Administered 2016-11-20 – 2016-11-22 (×6): 325 mg via ORAL
  Filled 2016-11-20 (×6): qty 1

## 2016-11-20 MED ORDER — DIBUCAINE 1 % RE OINT: 1.0000 "application " | TOPICAL_OINTMENT | RECTAL | Status: DC | PRN

## 2016-11-20 MED ORDER — HYDROCODONE-ACETAMINOPHEN 5-325 MG PO TABS
1.0000 | ORAL_TABLET | Freq: Four times a day (QID) | ORAL | Status: DC | PRN
  Administered 2016-11-21 – 2016-11-22 (×2): 1 via ORAL
  Filled 2016-11-20 (×2): qty 1

## 2016-11-20 NOTE — Anesthesia Postprocedure Evaluation (Signed)
Anesthesia Post Note  Patient: Cynthia George  Procedure(s) Performed: * No procedures listed *  Patient location during evaluation: Mother Baby Anesthesia Type: Epidural Level of consciousness: awake and alert Pain management: pain level controlled Vital Signs Assessment: post-procedure vital signs reviewed and stable Respiratory status: spontaneous breathing, nonlabored ventilation and respiratory function stable Cardiovascular status: stable Postop Assessment: no headache, no backache and epidural receding Anesthetic complications: no     Last Vitals:  Vitals:   11/20/16 0456 11/20/16 0732  BP: (!) 104/56 (!) 144/69  Pulse: 80 60  Resp: 18   Temp: 37.1 C 37 C    Last Pain:  Vitals:   11/20/16 0732  TempSrc: Oral  PainSc:                  Rockford Leinen S

## 2016-11-20 NOTE — Discharge Summary (Signed)
OB Discharge Summary     Patient Name: Cynthia George DOB: [birth year] MRN: [id number]  Date of admission: 11/18/2016 Delivering MD: Thomasene MohairStephen Jackson, MD  Date of Delivery: 11/20/2016  Date of discharge: 11/22/2016  Admitting diagnosis:  1) gestational hypertension 2) hypothyroidism 3) obesity with BMI > 40  Intrauterine pregnancy: 7958w4d     Secondary diagnosis: Gestational Hypertension     Discharge diagnosis: Term Pregnancy Delivered and Gestational Hypertension                                                                                                Post partum procedures:none  Augmentation: AROM, Pitocin, Cytotec and Foley Balloon  Complications: None  Hospital course:  Induction of Labor With Vaginal Delivery   31 y.o. yo G1P0 at 10858w4d was admitted to the hospital 11/18/2016 for induction of labor.  Indication for induction: Gestational hypertension.  Patient had an uncomplicated labor course as follows: Membrane Rupture Time/Date: 4:04 AM ,11/19/2016   Intrapartum Procedures: Episiotomy: None [1]                                         Lacerations:  1st degree [2];Vaginal [6]  Patient had delivery of a Viable infant.  Information for the patient's newborn:  Tobe SosLopez, Boy Lenice [[id number]][[id number]]  Delivery Method: Vag-Spont   11/20/2016  Details of delivery can be found in separate delivery note.  Patient had a routine postpartum course. Patient is discharged home 11/22/16.  Physical exam  Vitals:   11/21/16 1922 11/21/16 2330 11/22/16 0335 11/22/16 0815  BP: 130/69 125/80 123/60 (!) 142/78  Pulse: 84 79 70 79  Resp: 18 18 18 20   Temp: 98.7 F (37.1 C) 98.4 F (36.9 C) 98 F (36.7 C) 97.9 F (36.6 C)  TempSrc: Oral Oral Oral Oral  SpO2: 100% 100% 98% 100%  Weight:      Height:       General: alert, cooperative and no distress Lochia: appropriate Uterine Fundus: firm Incision: N/A DVT Evaluation: No evidence of DVT seen on physical exam. Negative Homan's  sign.  Labs: Lab Results  Component Value Date   WBC 17.7 (H) 11/20/2016   HGB 11.0 (L) 11/20/2016   HCT 32.8 (L) 11/20/2016   MCV 79.7 (L) 11/20/2016   PLT 225 11/20/2016    Discharge instruction: per After Visit Summary.  Medications:  Allergies as of 11/22/2016      Reactions   Flagyl [metronidazole] Rash   Keflex [cephalexin] Rash   Septra [sulfamethoxazole-trimethoprim] Rash      Medication List    STOP taking these medications   Magnesium 250 MG Tabs     TAKE these medications   levothyroxine 50 MCG tablet Commonly known as:  SYNTHROID, LEVOTHROID Take 1 tablet (50 mcg total) by mouth daily before breakfast.   PRENATAL 1 PO Take by mouth.       Diet: routine diet  Activity: Advance as tolerated. Pelvic rest for 6 weeks.   Outpatient follow up: Follow-up Information  Conard NovakJackson, Stephen D, MD Follow up in 2 week(s).   Specialty:  Obstetrics and Gynecology Why:  follow up pp depression check Contact information: [address] [place] [address] [phone]             Postpartum contraception: Undecided Rhogam Given postpartum: yes Rubella vaccine given postpartum: no Varicella vaccine given postpartum: no TDaP given antepartum or postpartum: AP on 09/29/16  Newborn Data: Live born female  Birth Weight: 7 lb 4.8 oz (3310 g) APGAR: 3, 9   Baby Feeding: Breast  Disposition:home with mother  SIGNED: Annamarie MajorPaul Harris, MD, Merlinda FrederickFACOG [facility], [facility] 11/22/2016  8:51 AM

## 2016-11-21 NOTE — Progress Notes (Addendum)
Post Partum Day 1 Rounds done earlier in the day and the patient and her husband were asleep  Subjective: Complaint of pain under right breast in upper abdomen. She is unsure if it feels muscular but has had some relief from PO Norco. Tolerating regular diet, pain with PO meds, voiding and ambulating without difficulty. She is currently pumping.   No CP SOB F/C N/V or leg pain No HA, change of vision  Objective: BP 132/65 (BP Location: Right Arm) Comment: walking around room  Pulse 78   Temp 98.2 F (36.8 C) (Oral)   Resp 20   Ht 5\' 3"  (1.6 m)   Wt 245 lb (111.1 kg)   LMP 03/02/2016 (Exact Date)   SpO2 99%   Breastfeeding  BMI 43.40 kg/m    Physical Exam:  General: NAD CV: RRR Pulm: nl effort, CTABL Lochia: moderate Uterine Fundus: fundus firm and below umbilicus DVT Evaluation: no cords, ttp LEs    Recent Labs  11/20/16 0553  HGB 11.0*  HCT 32.8*  WBC 17.7*  PLT 225    Assessment/Plan: 31 y.o. G1P1000 postpartum day # 1  1. Continue routine postpartum care 2. A positive, Rubella Immune, Varicella Immune 3. TDAP UTD 4. Breastfeeding/Contraception: Progesterone-only pill Disposition: discharge to home tomorrow    Tresea MallJane Baylei Siebels, CNM

## 2016-11-21 NOTE — Lactation Note (Signed)
This note was copied from a baby's chart. Lactation Consultation Note Assisted mom with [facility] and occasional nasal flaring.  Assisted with positioning with pillow support in cradle hold skin to skin.  Mom has wide spaced, minimal breast tissue with right even less than left.  Mom had pre pumped for 5 minutes and got 5 ml colostrum before she knew she would be putting the baby to the breast for this feeding.  Cynthia George is fussing when arrived to nursery.  He calmed down when FOB held while mom prepared to breast feed. Cynthia George is struggling to latch effectively possibly d/t getting large volumes of DBM since going to [facility].  When we finally get him to latch, he falls asleep.  Nipple shield (#20) applied and filled with mom's colostrum and he latched with minimal assistance.  After he took a few sucks, he came off the breast fussing again.  Mom's expressed colostrum put in tip of nipple shield and placed him back on breast.  He began good rhythmic sucking with swallows until he consumed all the colostrum in NS.  Already prepared DBM put in curved tip syringe and placed alongside of nipple shield.  He sucked vigorously with good long draws and frequent swallows.  Continue to put more DBM in syringe alongside of NS, and he continued with nutritive sucking and swallowing.  After he had taken 30 ml of DBM, he started spitting it out.  Once he was taken off the breast and burped, he spit moderate amount of curdled breast milk and refused to re latch.  Placed back under bilirubin lights.  Explained SNS and discussed using for next breast feed to get required supplementation.  Lactation name and number written on white board and encouraged mom and/or Cynthia RegalCarol, RN, caring for newborn in [facility] to call me for next breast feed.    Patient Name: Cynthia George JYNWG'NToday's Date: 11/21/2016 Reason for consult: Follow-up assessment;NICU baby;Hyperbilirubinemia;Other  (Comment)   Maternal Data Formula Feeding for Exclusion: No Has patient been taught Hand Expression?: Yes Does the patient have breastfeeding experience prior to this delivery?: No  Feeding Feeding Type: Breast Fed Length of feed: 25 min  LATCH Score Latch: Repeated attempts needed to sustain latch, nipple held in mouth throughout feeding, stimulation needed to elicit sucking reflex.  Audible Swallowing: Spontaneous and intermittent (Giving DBM alongside of NS to keep Cynthia George sucking & Swallow)  Type of Nipple: Everted at rest and after stimulation  Comfort (Breast/Nipple): Soft / non-tender  Hold (Positioning): Assistance needed to correctly position infant at breast and maintain latch.  LATCH Score: 8  Interventions Interventions: Assisted with latch;Skin to skin;Breast massage;Hand express;Pre-pump if needed;Reverse pressure;Breast compression;Adjust position;Support pillows;Position options;Expressed milk;DEBP  Lactation Tools Discussed/Used Tools: Nipple Cynthia CarnesShields;Other (comment) (Curved tip syringe-put milk in tip of NS & alongside of NS) Nipple shield size: 20 Breast pump type: Double-Electric Breast Pump ([facility] in room & [place] [facility] WIC Program: No ([facility] - [employer]) Pump Review: Setup, frequency, and cleaning;Milk Storage Initiated by:: Cynthia Barbara,RN,BSN,IBCLC Date initiated:: 11/21/16   Consult Status Consult Status: Follow-up Date: 11/21/16 Follow-up type: Call as needed    Louis MeckelWilliams, [name] 11/21/2016, 10:08 AM

## 2016-11-22 ENCOUNTER — Telehealth: Payer: Self-pay | Admitting: Family Medicine

## 2016-11-22 NOTE — Progress Notes (Signed)
Post Partum Day 2  Subjective: Lochia appropriate.  Breast feeding/pumping. No CP SOB F/C N/V or leg pain No HA, change of vision Infant in [facility].  Objective: BP (!) 142/78 (BP Location: Right Arm)   Pulse 79   Temp 97.9 F (36.6 C) (Oral)   Resp 20   Ht 5\' 3"  (1.6 m)   Wt 245 lb (111.1 kg)   LMP 03/02/2016 (Exact Date)   SpO2 100%   Breastfeeding? Unknown   BMI 43.40 kg/m    Physical Exam:  General: NAD CV: RRR Pulm: nl effort, CTABL Lochia: moderate Uterine Fundus: fundus firm and below umbilicus DVT Evaluation: no cords, ttp LEs   Recent Labs  11/20/16 0553  HGB 11.0*  HCT 32.8*  WBC 17.7*  PLT 225   Assessment/Plan: 31 y.o. G1P1000 postpartum day # 2  1. Continue routine postpartum care 2. A positive, Rubella Immune, Varicella Immune 3. TDAP UTD 4. Breastfeeding/Contraception: now undecided, wishes to wait to decide 5. Disposition: discharge to home  Infant under monitoring in [facility]  Annamarie MajorPaul Zavian Slowey, MD, Merlinda FrederickFACOG [facility], [facility] 11/22/2016  8:53 AM

## 2016-11-22 NOTE — Lactation Note (Signed)
This note was copied from a baby's chart. Lactation Consultation Note  Patient Name: Boy Knox Salivaracey Oliger BJYNW'GToday's Date: 11/22/2016     Maternal Data    Feeding Feeding Type: Donor Breast Milk Nipple Type: Slow - flow Length of feed: 30 min  LATCH Score                   Interventions    Lactation Tools Discussed/Used     Consult Status  Mom prefers to pump and bottlefeed until baby is out from [employer] lights. LC instructed mom to pump q3/4hrs.     Burnadette PeterJaniya M Jakaya Jacobowitz 11/22/2016, 1:49 PM

## 2016-11-22 NOTE — Telephone Encounter (Signed)
FYI: Patient wanted to inform provider that she will call to schedule an appointment once new born child is released from the hospital.

## 2016-11-22 NOTE — Telephone Encounter (Signed)
Routing to provider  

## 2016-11-22 NOTE — Progress Notes (Signed)
Discharge instructions reviewed with patient.  All questions answered.  Follow up appointment scheduled.  

## 2016-11-22 NOTE — Progress Notes (Signed)
Pt watching Period of purple cry video at this time.  Copy given to pt and spouse

## 2016-11-23 ENCOUNTER — Encounter: Payer: Self-pay | Admitting: Obstetrics and Gynecology

## 2016-12-06 ENCOUNTER — Ambulatory Visit (INDEPENDENT_AMBULATORY_CARE_PROVIDER_SITE_OTHER): Payer: 59 | Admitting: Obstetrics & Gynecology

## 2016-12-06 ENCOUNTER — Encounter: Payer: Self-pay | Admitting: Obstetrics & Gynecology

## 2016-12-06 VITALS — BP 140/90 | HR 85 | Ht 63.0 in | Wt 233.0 lb

## 2016-12-06 DIAGNOSIS — O165 Unspecified maternal hypertension, complicating the puerperium: Secondary | ICD-10-CM

## 2016-12-06 DIAGNOSIS — O9270 Unspecified disorders of lactation: Secondary | ICD-10-CM | POA: Insufficient documentation

## 2016-12-06 DIAGNOSIS — E039 Hypothyroidism, unspecified: Secondary | ICD-10-CM

## 2016-12-06 DIAGNOSIS — O99280 Endocrine, nutritional and metabolic diseases complicating pregnancy, unspecified trimester: Secondary | ICD-10-CM

## 2016-12-06 MED ORDER — METOCLOPRAMIDE HCL 10 MG PO TABS: ORAL_TABLET | ORAL | 6 refills | Status: DC

## 2016-12-06 NOTE — Progress Notes (Signed)
  History of Present Illness:  Cynthia George is a 31 y.o. who is having lactation concerns with low milk production.  Delivery 2 week ago. Had gest HTN but on no meds.  Occas h/a, and had migraines before prregnancy.  No depression sx's reported.  Denies change in vision, CP, SOB, edema.  Admits to stress at home.  PMHx: She  has a past medical history of Anxiety; Depression; Hyperlipidemia; Migraines; Obesity; and PCOS (polycystic ovarian syndrome). Also,  has a past surgical history that includes Appendectomy (2013); Tonsillectomy and adenoidectomy (2004); Humerus fracture surgery (Left, 2002,2003); and Wisdom tooth extraction (2011)., family history includes Asthma in her father and sister; Diabetes in her father, maternal grandfather, maternal grandmother, and paternal grandmother; Heart attack in her maternal grandfather, mother, and paternal grandfather; Heart attack (age of onset: 56) in her father; Heart disease in her mother; Hyperlipidemia in her father, maternal grandmother, mother, and sister; Hypertension in her father, maternal grandfather, maternal grandmother, and mother; Mental illness in her brother and sister; Polycystic ovary syndrome in her sister.,  reports that she has never smoked. She has never used smokeless tobacco. She reports that she does not drink alcohol or use drugs. No outpatient prescriptions have been marked as taking for the 12/06/16 encounter (Office Visit) with Nadara Mustard, MD.  . Also, is allergic to flagyl [metronidazole]; keflex [cephalexin]; and septra [sulfamethoxazole-trimethoprim]..  Review of Systems  Constitutional: Negative for chills, fever and malaise/fatigue.  HENT: Negative for congestion, sinus pain and sore throat.   Eyes: Negative for blurred vision and pain.  Respiratory: Negative for cough and wheezing.   Cardiovascular: Negative for chest pain and leg swelling.  Gastrointestinal: Negative for abdominal pain, constipation, diarrhea,  heartburn, nausea and vomiting.  Genitourinary: Negative for dysuria, frequency, hematuria and urgency.  Musculoskeletal: Negative for back pain, joint pain, myalgias and neck pain.  Skin: Negative for itching and rash.  Neurological: Negative for dizziness, tremors and weakness.  Endo/Heme/Allergies: Does not bruise/bleed easily.  Psychiatric/Behavioral: Negative for depression. The patient is not nervous/anxious and does not have insomnia.    Physical Exam:  BP 140/90   Pulse 85   Ht 5\' 3"  (1.6 m)   Wt 233 lb (105.7 kg)   BMI 41.27 kg/m  Body mass index is 41.27 kg/m. Constitutional: Well nourished, well developed female in no acute distress.  Abdomen: diffusely non tender to palpation, non distended, and no masses, hernias Neuro: Grossly intact Psych:  Normal mood and affect.    Assessment:  Problem List Items Addressed This Visit      Cardiovascular and Mediastinum   Postpartum hypertension     Endocrine   Hypothyroidism affecting pregnancy - Primary     Other   Lactation problem     Plan: Reglan to assist w milk production. Pros and cons discussed. Monitor for worsening depression or HTN.  F/u one week.  She was amenable to this plan and we will see her back for PRN.  Annamarie Major, MD, [facility], [facility] 12/06/2016  2:41 PM

## 2016-12-06 NOTE — Patient Instructions (Signed)
Metoclopramide tablets What is this medicine? METOCLOPRAMIDE (met oh kloe PRA mide) is used to treat the symptoms of gastroesophageal reflux disease (GERD) like heartburn. It is also used to treat people with slow emptying of the stomach and intestinal tract. This medicine may be used for other purposes; ask your health care provider or pharmacist if you have questions. COMMON BRAND NAME(S): Reglan What should I tell my health care provider before I take this medicine? They need to know if you have any of these conditions: -breast cancer -depression -diabetes -heart failure -high blood pressure -kidney disease -liver disease -Parkinson's disease or a movement disorder -pheochromocytoma -seizures -stomach obstruction, bleeding, or perforation -an unusual or allergic reaction to metoclopramide, procainamide, sulfites, other medicines, foods, dyes, or preservatives -pregnant or trying to get pregnant -breast-feeding How should I use this medicine? Take this medicine by mouth with a glass of water. Follow the directions on the prescription label. Take this medicine on an empty stomach, about 30 minutes before eating. Take your doses at regular intervals. Do not take your medicine more often than directed. Do not stop taking except on the advice of your doctor or health care professional. A special MedGuide will be given to you by the pharmacist with each prescription and refill. Be sure to read this information carefully each time. Talk to your pediatrician regarding the use of this medicine in children. Special care may be needed. Overdosage: If you think you have taken too much of this medicine contact a poison control center or emergency room at once. NOTE: This medicine is only for you. Do not share this medicine with others. What if I miss a dose? If you miss a dose, take it as soon as you can. If it is almost time for your next dose, take only that dose. Do not take double or extra  doses. What may interact with this medicine? -acetaminophen -cyclosporine -digoxin -medicines for blood pressure -medicines for diabetes, including insulin -medicines for hay fever and other allergies -medicines for depression, especially a Monoamine Oxidase Inhibitor (MAOI) -medicines for Parkinson's disease, like levodopa -medicines for sleep or for pain -quinidine -tetracycline This list may not describe all possible interactions. Give your health care provider a list of all the medicines, herbs, non-prescription drugs, or dietary supplements you use. Also tell them if you smoke, drink alcohol, or use illegal drugs. Some items may interact with your medicine. What should I watch for while using this medicine? It may take a few weeks for your stomach condition to start to get better. However, do not take this medicine for longer than 12 weeks. The longer you take this medicine, and the more you take it, the greater your chances are of developing serious side effects. If you are an elderly patient, a female patient, or you have diabetes, you may be at an increased risk for side effects from this medicine. Contact your doctor immediately if you start having movements you cannot control such as lip smacking, rapid movements of the tongue, involuntary or uncontrollable movements of the eyes, head, arms and legs, or muscle twitches and spasms. Patients and their families should watch out for worsening depression or thoughts of suicide. Also watch out for any sudden or severe changes in feelings such as feeling anxious, agitated, panicky, irritable, hostile, aggressive, impulsive, severely restless, overly excited and hyperactive, or not being able to sleep. If this happens, especially at the beginning of treatment or after a change in dose, call your doctor. Do not treat  yourself for high fever. Ask your doctor or health care professional for advice. You may get drowsy or dizzy. Do not drive, use  machinery, or do anything that needs mental alertness until you know how this drug affects you. Do not stand or sit up quickly, especially if you are an older patient. This reduces the risk of dizzy or fainting spells. Alcohol can make you more drowsy and dizzy. Avoid alcoholic drinks. What side effects may I notice from receiving this medicine? Side effects that you should report to your doctor or health care professional as soon as possible: -allergic reactions like skin rash, itching or hives, swelling of the face, lips, or tongue -abnormal production of milk in females -breast enlargement in both males and females -change in the way you walk -difficulty moving, speaking or swallowing -drooling, lip smacking, or rapid movements of the tongue -excessive sweating -fever -involuntary or uncontrollable movements of the eyes, head, arms and legs -irregular heartbeat or palpitations -muscle twitches and spasms -unusually weak or tired Side effects that usually do not require medical attention (report to your doctor or health care professional if they continue or are bothersome): -change in sex drive or performance -depressed mood -diarrhea -difficulty sleeping -headache -menstrual changes -restless or nervous This list may not describe all possible side effects. Call your doctor for medical advice about side effects. You may report side effects to FDA at 1-800-FDA-1088. Where should I keep my medicine? Keep out of the reach of children. Store at room temperature between 20 and 25 degrees C (68 and 77 degrees F). Protect from light. Keep container tightly closed. Throw away any unused medicine after the expiration date. NOTE: This sheet is a summary. It may not cover all possible information. If you have questions about this medicine, talk to your doctor, pharmacist, or health care provider.  2018 Elsevier/Gold Standard (2016-01-21 15:13:45)

## 2016-12-13 ENCOUNTER — Ambulatory Visit (INDEPENDENT_AMBULATORY_CARE_PROVIDER_SITE_OTHER): Payer: 59 | Admitting: Obstetrics and Gynecology

## 2016-12-13 ENCOUNTER — Encounter: Payer: Self-pay | Admitting: Obstetrics and Gynecology

## 2016-12-13 VITALS — BP 144/86 | Wt 230.0 lb

## 2016-12-13 DIAGNOSIS — O165 Unspecified maternal hypertension, complicating the puerperium: Secondary | ICD-10-CM | POA: Diagnosis not present

## 2016-12-13 DIAGNOSIS — O9270 Unspecified disorders of lactation: Secondary | ICD-10-CM

## 2016-12-13 NOTE — Progress Notes (Signed)
History of Present Illness:  Cynthia George is a 31 y.o. who presents for blood pressure check follow up and who was seen last week for having lactation concerns with low milk production.  Delivery 4 week ago. Had gest HTN but on no meds.  Occas h/a, and had migraines before prregnancy.  Headache always resolves with Tylenol.  No depression sx's reported.  Denies change in vision, CP, SOB, edema.  Some improvement with Reglan for breast milk production.  Infant not latching. So, she is pumping and giving breast milk through bottle.  PMHx: She  has a past medical history of Anxiety; Depression; Hyperlipidemia; Migraines; Obesity; and PCOS (polycystic ovarian syndrome). Also,  has a past surgical history that includes Appendectomy (2013); Tonsillectomy and adenoidectomy (2004); Humerus fracture surgery (Left, 2002,2003); and Wisdom tooth extraction (2011)., family history includes Asthma in her father and sister; Diabetes in her father, maternal grandfather, maternal grandmother, and paternal grandmother; Heart attack in her maternal grandfather, mother, and paternal grandfather; Heart attack (age of onset: 19) in her father; Heart disease in her mother; Hyperlipidemia in her father, maternal grandmother, mother, and sister; Hypertension in her father, maternal grandfather, maternal grandmother, and mother; Mental illness in her brother and sister; Polycystic ovary syndrome in her sister.,  reports that she has never smoked. She has never used smokeless tobacco. She reports that she does not drink alcohol or use drugs. No outpatient prescriptions have been marked as taking for the 12/13/16 encounter (Postpartum Visit) with Conard Novak, MD.  . Also, is allergic to flagyl [metronidazole]; keflex [cephalexin]; and septra [sulfamethoxazole-trimethoprim]..  Review of Systems  Constitutional: Negative for chills, fever and malaise/fatigue.  HENT: Negative for congestion, sinus pain and sore throat.   Eyes:  Negative for blurred vision and pain.  Respiratory: Negative for cough and wheezing.   Cardiovascular: Negative for chest pain and leg swelling.  Gastrointestinal: Negative for abdominal pain, constipation, diarrhea, heartburn, nausea and vomiting.  Genitourinary: Negative for dysuria, frequency, hematuria and urgency.  Musculoskeletal: Negative for back pain, joint pain, myalgias and neck pain.  Skin: Negative for itching and rash.  Neurological: Negative for dizziness, tremors and weakness.  Endo/Heme/Allergies: Does not bruise/bleed easily.  Psychiatric/Behavioral: Negative for depression. The patient is not nervous/anxious and does not have insomnia.    Physical Exam:  BP (!) 144/86   Wt 230 lb (104.3 kg)   BMI 40.74 kg/m  Body mass index is 40.74 kg/m. Constitutional: Well nourished, well developed female in no acute distress.  Abdomen: diffusely non tender to palpation, non distended, and no masses, hernias Neuro: Grossly intact Psych:  Normal mood and affect.   Cardiovascular: Regular rate and rhythm.   Extremity: no edema  Respiratory: Clear to auscultation bilateral. Normal respiratory effort  Assessment:  Problem List Items Addressed This Visit    Postpartum hypertension - Primary   Lactation problem     Plan: Continue Reglan to assist w milk production. Pros and cons discussed.  Monitor for worsening depression or HTN.  F/u two weeks. BP essentially stable with no new symptoms.  Discussed pros/cons of starting anti-hypertensive.  Will hold for now. But, patient was given list of red-flag symptoms such as intractable headache, blurry vision or seeing spots, RUQ pain.  She is to call ASAP should any of these symptoms develop.   She was amenable to this plan and we will see her back in 2 weeks for BP check and 6 week postpartum visit.   Thomasene Mohair, MD  [facility]  [facility]yn, [facility] 12/13/2016  5:42 PM

## 2016-12-27 ENCOUNTER — Encounter: Payer: Self-pay | Admitting: Obstetrics and Gynecology

## 2016-12-27 ENCOUNTER — Ambulatory Visit (INDEPENDENT_AMBULATORY_CARE_PROVIDER_SITE_OTHER): Payer: 59 | Admitting: Obstetrics and Gynecology

## 2016-12-27 VITALS — BP 138/72 | HR 70 | Ht 63.0 in | Wt 234.0 lb

## 2016-12-27 DIAGNOSIS — O165 Unspecified maternal hypertension, complicating the puerperium: Secondary | ICD-10-CM

## 2016-12-27 NOTE — Progress Notes (Signed)
Postpartum Visit   Chief Complaint  Patient presents with  . Postpartum Care    History of Present Illness: Patient is a 31 y.o. G1P1001 presents for postpartum visit.  Date of delivery: 11/20/2016 Type of delivery: Vaginal delivery - Vacuum or forceps assisted  no Episiotomy No.  Laceration: yes, 1st degree Pregnancy or labor problems:  Gestational hypertension, hypothyroidism, obesity, history of depression Any problems since the delivery:  Lactation concerns  Newborn Details:  SINGLETON :  1. Bab's Name: Mikle BosworthCarlos Birth weight: 7 lb 5 oz Maternal Details:  Breast Feeding:  Yes, some formula Post partum depression/anxiety noted:  no Edinburgh Post-Partum Depression Score:  7  Date of last PAP: 05/03/16  normal   Past Medical History:  Diagnosis Date  . Anxiety   . Depression   . Hyperlipidemia   . Migraines   . Obesity   . PCOS (polycystic ovarian syndrome)     Past Surgical History:  Procedure Laterality Date  . APPENDECTOMY  2013  . HUMERUS FRACTURE SURGERY Left 2002,2003   rod and screws placed and removed  . TONSILLECTOMY AND ADENOIDECTOMY  2004  . WISDOM TOOTH EXTRACTION  2011   All    Prior to Admission medications   Medication Sig Start Date End Date Taking? Authorizing Provider  levothyroxine (SYNTHROID, LEVOTHROID) 50 MCG tablet Take 1 tablet (50 mcg total) by mouth daily before breakfast. 10/06/16   Vena AustriaStaebler, Andreas, MD  metoCLOPramide (REGLAN) 10 MG tablet One tablet po 4 times daily 12/06/16   Nadara MustardHarris, Robert P, MD  Prenatal MV-Min-Fe Fum-FA-DHA (PRENATAL 1 PO) Take by mouth.    [provider]    Allergies  Allergen Reactions  . Flagyl [Metronidazole] Rash  . Keflex [Cephalexin] Rash  . Septra [Sulfamethoxazole-Trimethoprim] Rash     Social History   Social History  . Marital status: Married    Spouse name: N/A  . Number of children: N/A  . Years of education: N/A   Occupational History  . Not on file.   Social History Main  Topics  . Smoking status: Never Smoker  . Smokeless tobacco: Never Used  . Alcohol use No  . Drug use: No  . Sexual activity: Not Currently    Birth control/ protection: None   Other Topics Concern  . Not on file   Social History Narrative  . No narrative on file    Family History  Problem Relation Age of Onset  . Heart disease Mother   . Hypertension Mother   . Hyperlipidemia Mother   . Heart attack Mother   . Hyperlipidemia Father   . Hypertension Father   . Diabetes Father   . Asthma Father   . Heart attack Father [id number]  . Hyperlipidemia Sister   . Asthma Sister   . Polycystic ovary syndrome Sister   . Mental illness Sister        Depression  . Mental illness Brother        Depression  . Diabetes Maternal Grandmother   . Hyperlipidemia Maternal Grandmother   . Hypertension Maternal Grandmother   . Diabetes Maternal Grandfather   . Hypertension Maternal Grandfather   . Heart attack Maternal Grandfather   . Diabetes Paternal Grandmother   . Heart attack Paternal Grandfather     Review of Systems  Constitutional: Negative.   HENT: Negative.   Eyes: Negative.   Respiratory: Negative.   Cardiovascular: Negative.   Gastrointestinal: Negative.   Genitourinary: Negative.   Musculoskeletal: Negative.   Skin: Negative.  Neurological: Negative.   Psychiatric/Behavioral: Negative.      Physical Exam BP 138/72 (BP Location: Left Arm, Patient Position: Sitting, Cuff Size: Large)   Pulse 70   Ht  (1.6 m)   Wt 234 lb (106.1 kg)   Breastfeeding? Yes   BMI 41.45 kg/m   Physical Exam  Constitutional: She is oriented to person, place, and time. She appears well-developed and well-nourished. No distress.  Genitourinary: Vagina normal and uterus normal. Pelvic exam was performed with patient supine. There is no rash, tenderness or lesion on the right labia. There is no rash, tenderness or lesion on the left labia. Right adnexum does not display mass, does not  display tenderness and does not display fullness. Left adnexum does not display mass, does not display tenderness and does not display fullness. Cervix does not exhibit motion tenderness, lesion or polyp.   Uterus is mobile. Uterus is not enlarged, tender, exhibiting a mass or irregular (is regular).  Genitourinary Comments: Exam limited by patient's body habitus  HENT:  Head: Normocephalic and atraumatic.  Eyes: EOM are normal. No scleral icterus.  Neck: Normal range of motion. Neck supple.  Cardiovascular: Normal rate and regular rhythm.   Pulmonary/Chest: Effort normal and breath sounds normal. No respiratory distress. She has no wheezes. She has no rales.  Abdominal: Soft. Bowel sounds are normal. She exhibits no distension and no mass. There is no tenderness. There is no rebound and no guarding.  Musculoskeletal: Normal range of motion. She exhibits no edema.  Neurological: She is alert and oriented to person, place, and time. No cranial nerve deficit.  Skin: Skin is warm and dry. No erythema.  Psychiatric: She has a normal mood and affect. Her behavior is normal. Judgment normal.     Female Chaperone present during breast and/or pelvic exam.  Assessment: 31 y.o. G1P1001 presenting for 6 week postpartum visit  Plan: Problem List Items Addressed This Visit    Postpartum hypertension - Primary    Other Visit Diagnoses    Postpartum care and examination          1) Contraception Education given regarding options for contraception, including barrier methods. Patient given reading materials in case she changes her mind.   2)  Pap - ASCCP guidelines and rational discussed.  Patient opts for normal/routine screening interval  3) Patient underwent screening for postpartum depression with no concerns noted.  4) Follow up 1 year for routine annual exam  Thomasene Mohair, MD 12/27/2016 4:11 PM

## 2016-12-29 ENCOUNTER — Encounter: Payer: Self-pay | Admitting: Family Medicine

## 2016-12-29 ENCOUNTER — Ambulatory Visit (INDEPENDENT_AMBULATORY_CARE_PROVIDER_SITE_OTHER): Payer: 59 | Admitting: Family Medicine

## 2016-12-29 VITALS — BP 114/80 | HR 84 | Temp 98.4°F | Wt 228.1 lb

## 2016-12-29 DIAGNOSIS — E782 Mixed hyperlipidemia: Secondary | ICD-10-CM | POA: Diagnosis not present

## 2016-12-29 DIAGNOSIS — Z23 Encounter for immunization: Secondary | ICD-10-CM

## 2016-12-29 DIAGNOSIS — E039 Hypothyroidism, unspecified: Secondary | ICD-10-CM

## 2016-12-29 DIAGNOSIS — O165 Unspecified maternal hypertension, complicating the puerperium: Secondary | ICD-10-CM

## 2016-12-29 DIAGNOSIS — D72829 Elevated white blood cell count, unspecified: Secondary | ICD-10-CM | POA: Diagnosis not present

## 2016-12-29 NOTE — Assessment & Plan Note (Signed)
Rechecking levels today. Await results. Call with any concerns.  

## 2016-12-29 NOTE — Assessment & Plan Note (Signed)
Rechecking levels today. Continue to monitor. Call with any concerns.  

## 2016-12-29 NOTE — Assessment & Plan Note (Signed)
BP good today. Continue to monitor. Call with any concerns.

## 2016-12-29 NOTE — Progress Notes (Signed)
BP 114/80 (BP Location: Left Arm, Patient Position: Sitting, Cuff Size: Normal)   Pulse 84   Temp 98.4 F (36.9 C)   Wt 228 lb 1 oz (103.4 kg)   SpO2 98%   BMI 40.40 kg/m    Subjective:    Patient ID: Cynthia George, female    DOB: 1985/06/20, 31 y.o.   MRN: [id number]  HPI: Cynthia George is a 31 y.o. female  Chief Complaint  Patient presents with  . Hypothyroidism  . Hyperlipidemia   HYPERLIPIDEMIA Hyperlipidemia status: has been pregnant. Rechecking today. Past cholesterol meds: none Supplements: none Aspirin:  no The ASCVD Risk score [name](Goff DC Jr., et al., 2013) failed to calculate for the following reasons:   The 2013 ASCVD risk score is only valid for ages 2640 to 7079 Chest pain:  no Coronary artery disease:  no Family history CAD:  yes  HYPOTHYROIDISM Thyroid control status:stable Satisfied with current treatment? yes Medication side effects: no Medication compliance: excellent compliance Recent dose adjustment:no Fatigue: no Cold intolerance: no Heat intolerance: no Weight gain: no Weight loss: no Constipation: no Diarrhea/loose stools: no Palpitations: no Lower extremity edema: no Anxiety/depressed mood: no   Relevant past medical, surgical, family and social history reviewed and updated as indicated. Interim medical history since our last visit reviewed. Allergies and medications reviewed and updated.  Review of Systems  Constitutional: Negative.   Respiratory: Negative.   Cardiovascular: Negative.   Psychiatric/Behavioral: Negative.     Per HPI unless specifically indicated above     Objective:    BP 114/80 (BP Location: Left Arm, Patient Position: Sitting, Cuff Size: Normal)   Pulse 84   Temp 98.4 F (36.9 C)   Wt 228 lb 1 oz (103.4 kg)   SpO2 98%   BMI 40.40 kg/m   Wt Readings from Last 3 Encounters:  12/29/16 228 lb 1 oz (103.4 kg)  12/27/16 234 lb (106.1 kg)  12/13/16 230 lb (104.3 kg)    Physical Exam  Constitutional: She is  oriented to person, place, and time. She appears well-developed and well-nourished. No distress.  HENT:  Head: Normocephalic and atraumatic.  Right Ear: Hearing normal.  Left Ear: Hearing normal.  Nose: Nose normal.  Eyes: Conjunctivae and lids are normal. Right eye exhibits no discharge. Left eye exhibits no discharge. No scleral icterus.  Cardiovascular: Normal rate, regular rhythm, normal heart sounds and intact distal pulses.  Exam reveals no gallop and no friction rub.   No murmur heard. Pulmonary/Chest: Effort normal and breath sounds normal. No respiratory distress. She has no wheezes. She has no rales. She exhibits no tenderness.  Musculoskeletal: Normal range of motion.  Neurological: She is alert and oriented to person, place, and time.  Skin: Skin is warm, dry and intact. No rash noted. She is not diaphoretic. No erythema. No pallor.  Psychiatric: She has a normal mood and affect. Her speech is normal and behavior is normal. Judgment and thought content normal. Cognition and memory are normal.    Results for orders placed or performed during the hospital encounter of 11/18/16  CBC  Result Value Ref Range   WBC 10.1 3.6 - 11.0 K/uL   RBC 4.41 3.80 - 5.20 MIL/uL   Hemoglobin 11.8 (L) 12.0 - 16.0 g/dL   HCT 82.934.8 (L) 56.235.0 - 13.047.0 %   MCV 79.0 (L) 80.0 - 100.0 fL   MCH 26.7 26.0 - 34.0 pg   MCHC 33.8 32.0 - 36.0 g/dL   RDW 86.516.1 (  H) 11.5 - 14.5 %   Platelets 259 150 - 440 K/uL  RPR  Result Value Ref Range   RPR Ser Ql Non Reactive Non Reactive  Protein / creatinine ratio, urine  Result Value Ref Range   Creatinine, Urine 363 mg/dL   Total Protein, Urine 72 mg/dL   Protein Creatinine Ratio 0.20 (H) 0.00 - 0.15 mg/mg[Cre]  Comprehensive metabolic panel  Result Value Ref Range   Sodium 136 135 - 145 mmol/L   Potassium 4.1 3.5 - 5.1 mmol/L   Chloride 107 101 - 111 mmol/L   CO2 21 (L) 22 - 32 mmol/L   Glucose, Bld 123 (H) 65 - 99 mg/dL   BUN 10 6 - 20 mg/dL   Creatinine,  Ser 9.60 0.44 - 1.00 mg/dL   Calcium 9.1 8.9 - 45.4 mg/dL   Total Protein 6.4 (L) 6.5 - 8.1 g/dL   Albumin 2.9 (L) 3.5 - 5.0 g/dL   AST 20 15 - 41 U/L   ALT 16 14 - 54 U/L   Alkaline Phosphatase 90 38 - 126 U/L   Total Bilirubin 0.5 0.3 - 1.2 mg/dL   GFR calc non Af Amer >60 >60 mL/min   GFR calc Af Amer >60 >60 mL/min   Anion gap 8 5 - 15  CBC  Result Value Ref Range   WBC 17.7 (H) 3.6 - 11.0 K/uL   RBC 4.12 3.80 - 5.20 MIL/uL   Hemoglobin 11.0 (L) 12.0 - 16.0 g/dL   HCT 09.8 (L) 11.9 - 14.7 %   MCV 79.7 (L) 80.0 - 100.0 fL   MCH 26.6 26.0 - 34.0 pg   MCHC 33.4 32.0 - 36.0 g/dL   RDW 82.9 (H) 56.2 - 13.0 %   Platelets 225 150 - 440 K/uL  Type and screen [facility]  Result Value Ref Range   ABO/RH(D) A POS    Antibody Screen NEG    Sample Expiration 11/21/2016       Assessment & Plan:   Problem List Items Addressed This Visit      Cardiovascular and Mediastinum   Postpartum hypertension    BP good today. Continue to monitor. Call with any concerns.         Endocrine   Hypothyroid    Rechecking levels today. Continue to monitor. Call with any concerns.       Relevant Orders   Thyroid Panel With TSH     Other   HLD (hyperlipidemia) - Primary    Rechecking levels today. Await results. Call with any concerns.       Relevant Orders   Lipid Panel w/o Chol/HDL Ratio   Comprehensive metabolic panel    Other Visit Diagnoses    Leukocytosis, unspecified type       Elevated during labor. Likely acute phase reactant. Rechecking levels today. Await results.    Relevant Orders   CBC with Differential/Platelet   Immunization due       Flu shot given today.    Relevant Orders   Flu Vaccine QUAD 6+ mos PF IM (Fluarix Quad PF) (Completed)       Follow up plan: Return February , for Phyiscal.

## 2016-12-30 LAB — CBC WITH DIFFERENTIAL/PLATELET
BASOS ABS: 0.1 10*3/uL (ref 0.0–0.2)
Basos: 1 %
EOS (ABSOLUTE): 0.3 10*3/uL (ref 0.0–0.4)
Eos: 4 %
Hematocrit: 36.4 % (ref 34.0–46.6)
Hemoglobin: 11.7 g/dL (ref 11.1–15.9)
Immature Grans (Abs): 0 10*3/uL (ref 0.0–0.1)
Immature Granulocytes: 0 %
LYMPHS: 25 %
Lymphocytes Absolute: 1.7 10*3/uL (ref 0.7–3.1)
MCH: 25.7 pg — AB (ref 26.6–33.0)
MCHC: 32.1 g/dL (ref 31.5–35.7)
MCV: 80 fL (ref 79–97)
MONOS ABS: 0.5 10*3/uL (ref 0.1–0.9)
Monocytes: 7 %
NEUTROS ABS: 4.3 10*3/uL (ref 1.4–7.0)
Neutrophils: 63 %
PLATELETS: 264 10*3/uL (ref 150–379)
RBC: 4.55 x10E6/uL (ref 3.77–5.28)
RDW: 15.2 % (ref 12.3–15.4)
WBC: 6.9 10*3/uL (ref 3.4–10.8)

## 2016-12-30 LAB — COMPREHENSIVE METABOLIC PANEL
A/G RATIO: 1.5 (ref 1.2–2.2)
ALT: 77 IU/L — AB (ref 0–32)
AST: 37 IU/L (ref 0–40)
Albumin: 4.5 g/dL (ref 3.5–5.5)
Alkaline Phosphatase: 75 IU/L (ref 39–117)
BILIRUBIN TOTAL: 0.6 mg/dL (ref 0.0–1.2)
BUN/Creatinine Ratio: 22 (ref 9–23)
BUN: 13 mg/dL (ref 6–20)
CALCIUM: 9.9 mg/dL (ref 8.7–10.2)
CHLORIDE: 102 mmol/L (ref 96–106)
CO2: 22 mmol/L (ref 20–29)
Creatinine, Ser: 0.58 mg/dL (ref 0.57–1.00)
GFR calc Af Amer: 143 mL/min/{1.73_m2} (ref >59)
GFR, EST NON AFRICAN AMERICAN: 124 mL/min/{1.73_m2} (ref >59)
Globulin, Total: 3 g/dL (ref 1.5–4.5)
Glucose: 94 mg/dL (ref 65–99)
Potassium: 4.5 mmol/L (ref 3.5–5.2)
Sodium: 141 mmol/L (ref 134–144)
Total Protein: 7.5 g/dL (ref 6.0–8.5)

## 2016-12-30 LAB — LIPID PANEL W/O CHOL/HDL RATIO
CHOLESTEROL TOTAL: 241 mg/dL — AB (ref 100–199)
HDL: 26 mg/dL — ABNORMAL LOW (ref >39)
Triglycerides: 519 mg/dL — ABNORMAL HIGH (ref 0–149)

## 2016-12-30 LAB — THYROID PANEL WITH TSH
FREE THYROXINE INDEX: 3.1 (ref 1.2–4.9)
T3 Uptake Ratio: 30 % (ref 24–39)
T4, Total: 10.2 ug/dL (ref 4.5–12.0)
TSH: 0.036 u[IU]/mL — ABNORMAL LOW (ref 0.450–4.500)

## 2017-01-03 ENCOUNTER — Other Ambulatory Visit: Payer: Self-pay | Admitting: Family Medicine

## 2017-01-03 ENCOUNTER — Telehealth: Payer: Self-pay | Admitting: Family Medicine

## 2017-01-03 DIAGNOSIS — E039 Hypothyroidism, unspecified: Secondary | ICD-10-CM

## 2017-01-03 MED ORDER — LEVOTHYROXINE SODIUM 25 MCG PO TABS: 25.0000 ug | ORAL_TABLET | Freq: Every day | ORAL | 1 refills | Status: DC

## 2017-01-03 NOTE — Telephone Encounter (Signed)
Please let her know that her thyroid is being over treated. I'd like her to take 1/2 of one of her pills and we'll recheck her levels in 6 weeks. Her other labs look good. Her cholesterol is still high, but it's better, so we'll just keep an eye on it and recheck it in 6 months.

## 2017-01-03 NOTE — Telephone Encounter (Signed)
Patient notified

## 2017-01-06 ENCOUNTER — Encounter: Payer: Self-pay | Admitting: Obstetrics and Gynecology

## 2017-02-10 ENCOUNTER — Other Ambulatory Visit: Payer: 59

## 2017-02-10 DIAGNOSIS — E039 Hypothyroidism, unspecified: Secondary | ICD-10-CM

## 2017-02-11 ENCOUNTER — Telehealth: Payer: Self-pay | Admitting: Family Medicine

## 2017-02-11 DIAGNOSIS — E039 Hypothyroidism, unspecified: Secondary | ICD-10-CM

## 2017-02-11 LAB — TSH: TSH: 3.98 u[IU]/mL (ref 0.450–4.500)

## 2017-02-11 MED ORDER — LEVOTHYROXINE SODIUM 25 MCG PO TABS: 12.5000 ug | ORAL_TABLET | Freq: Every day | ORAL | 3 refills | Status: DC

## 2017-02-11 NOTE — Telephone Encounter (Signed)
Called patient to let her know that her thyroid is back in the normal range, and that I'd like her to keep taking the 12.1105mcg daily. LMOM for her to call back. CRM generated and [employer] message sent.

## 2017-02-14 ENCOUNTER — Telehealth: Payer: Self-pay | Admitting: Family Medicine

## 2017-02-14 NOTE — Telephone Encounter (Signed)
Called and left a message for patient to return my call.  

## 2017-02-14 NOTE — Telephone Encounter (Signed)
Tiff, can you try her?

## 2017-02-14 NOTE — Telephone Encounter (Signed)
Copied from CRM #[id number]. Topic: Inquiry >> Feb 14, 2017  1:33 PM Stephannie LiSimmons, Ersel Enslin L, NT wrote: Reason for JXB:[id number]:Patient says she is returning a call Kennith Centerracey and no message was left

## 2017-02-15 NOTE — Telephone Encounter (Signed)
Patient is currently taking 25 mcg of thyroid medication, do you wan her to continue that or take

## 2017-02-15 NOTE — Telephone Encounter (Signed)
Patient notified

## 2017-02-15 NOTE — Telephone Encounter (Signed)
12.5 mcg. If 12.5 mcg [facility]

## 2017-02-15 NOTE — Telephone Encounter (Signed)
Called and left a message for patient to return my call.  

## 2017-02-15 NOTE — Telephone Encounter (Signed)
If she's been taking , she should continue to take the . I'd already sent the 12. - but I'll send the in.

## 2017-03-22 ENCOUNTER — Encounter: Payer: Self-pay | Admitting: Obstetrics and Gynecology

## 2017-06-13 ENCOUNTER — Encounter: Payer: Self-pay | Admitting: Family Medicine

## 2017-06-13 ENCOUNTER — Ambulatory Visit (INDEPENDENT_AMBULATORY_CARE_PROVIDER_SITE_OTHER): Payer: 59 | Admitting: Family Medicine

## 2017-06-13 VITALS — BP 127/76 | HR 73 | Temp 98.3°F | Ht 63.3 in | Wt 246.3 lb

## 2017-06-13 DIAGNOSIS — Z0000 Encounter for general adult medical examination without abnormal findings: Secondary | ICD-10-CM

## 2017-06-13 DIAGNOSIS — G4700 Insomnia, unspecified: Secondary | ICD-10-CM | POA: Diagnosis not present

## 2017-06-13 DIAGNOSIS — G43109 Migraine with aura, not intractable, without status migrainosus: Secondary | ICD-10-CM

## 2017-06-13 DIAGNOSIS — E782 Mixed hyperlipidemia: Secondary | ICD-10-CM

## 2017-06-13 DIAGNOSIS — E039 Hypothyroidism, unspecified: Secondary | ICD-10-CM

## 2017-06-13 DIAGNOSIS — R7989 Other specified abnormal findings of blood chemistry: Secondary | ICD-10-CM

## 2017-06-13 DIAGNOSIS — Z0001 Encounter for general adult medical examination with abnormal findings: Secondary | ICD-10-CM

## 2017-06-13 DIAGNOSIS — R945 Abnormal results of liver function studies: Secondary | ICD-10-CM

## 2017-06-13 LAB — UA/M W/RFLX CULTURE, ROUTINE
Bilirubin, UA: NEGATIVE
GLUCOSE, UA: NEGATIVE
KETONES UA: NEGATIVE
Leukocytes, UA: NEGATIVE
Nitrite, UA: NEGATIVE
RBC, UA: NEGATIVE
Specific Gravity, UA: 1.025 (ref 1.005–1.030)
UUROB: 0.2 mg/dL (ref 0.2–1.0)
pH, UA: 5.5 (ref 5.0–7.5)

## 2017-06-13 NOTE — Assessment & Plan Note (Signed)
Rechecking levels today. Continue to monitor. Call with any concerns. Adjust medicine as needed.

## 2017-06-13 NOTE — Assessment & Plan Note (Signed)
Under good control since having her son. Continue to monitor. Call with any concerns.

## 2017-06-13 NOTE — Progress Notes (Signed)
BP 127/76 (BP Location: Left Arm, Patient Position: Sitting, Cuff Size: Large)   Pulse 73   Temp 98.3 F (36.8 C)   Ht 5' 3.3" (1.608 m)   Wt 246 lb 5 oz (111.7 kg)   SpO2 97%   BMI 43.22 kg/m    Subjective:    Patient ID: Cynthia George, female    DOB: 11-25-1985, 32 y.o.   MRN: [id number]  HPI: Cynthia George is a 32 y.o. female presenting on 06/13/2017 for comprehensive medical examination. Current medical complaints include:  HYPOTHYROIDISM Thyroid control status:worse Satisfied with current treatment? no Medication side effects: no Medication compliance: excellent compliance Recent dose adjustment:yes Fatigue: yes Cold intolerance: no Heat intolerance:yes  Weight gain: yes Weight loss: no Constipation: no Diarrhea/loose stools: no Palpitations: no Lower extremity edema: no Anxiety/depressed mood: no  She currently lives with: son and husband Menopausal Symptoms: no  Depression Screen done today and results listed below:  Depression screen [facility] 2[facility] 06/13/2017 05/27/2016 05/26/2015  Decreased Interest 0 0 1  Down, Depressed, Hopeless 0 0 1  PHQ - 2 Score 0 0 2  Altered sleeping 3 1 -  Tired, decreased energy 3 1 -  Change in appetite 2 0 -  Feeling bad or failure about yourself  0 0 -  Trouble concentrating 0 0 -  Moving slowly or fidgety/restless 0 0 -  Suicidal thoughts 0 0 -  PHQ-9 Score 8 2 -  Difficult doing work/chores Not difficult at all - -    Past Medical History:  Past Medical History:  Diagnosis Date  . Anxiety   . Depression   . Hyperlipidemia   . Migraines   . Obesity   . PCOS (polycystic ovarian syndrome)     Surgical History:  Past Surgical History:  Procedure Laterality Date  . APPENDECTOMY  2013  . HUMERUS FRACTURE SURGERY Left 2002,2003   rod and screws placed and removed  . TONSILLECTOMY AND ADENOIDECTOMY  2004  . WISDOM TOOTH EXTRACTION  2011   All    Medications:  Current Outpatient Medications on File Prior to Visit    Medication Sig  . levothyroxine (SYNTHROID, LEVOTHROID) 25 MCG tablet Take 0.5 tablets (12.5 mcg total) by mouth daily before breakfast.   No current facility-administered medications on file prior to visit.     Allergies:  Allergies  Allergen Reactions  . Flagyl [Metronidazole] Rash  . Keflex [Cephalexin] Rash  . Septra [Sulfamethoxazole-Trimethoprim] Rash    Social History:  Social History   Socioeconomic History  . Marital status: Married    Spouse name: Not on file  . Number of children: Not on file  . Years of education: Not on file  . Highest education level: Not on file  Social Needs  . Financial resource strain: Not on file  . Food insecurity - worry: Not on file  . Food insecurity - inability: Not on file  . Transportation needs - medical: Not on file  . Transportation needs - non-medical: Not on file  Occupational History  . Not on file  Tobacco Use  . Smoking status: Never Smoker  . Smokeless tobacco: Never Used  Substance and Sexual Activity  . Alcohol use: No  . Drug use: No  . Sexual activity: Not Currently    Birth control/protection: None  Other Topics Concern  . Not on file  Social History Narrative  . Not on file   Social History   Tobacco Use  Smoking Status Never Smoker  Smokeless Tobacco Never Used   Social History   Substance and Sexual Activity  Alcohol Use No    Family History:  Family History  Problem Relation Age of Onset  . Heart disease Mother   . Hypertension Mother   . Hyperlipidemia Mother   . Heart attack Mother   . Hyperlipidemia Father   . Hypertension Father   . Diabetes Father   . Asthma Father   . Heart attack Father 26  . Hyperlipidemia Sister   . Asthma Sister   . Polycystic ovary syndrome Sister   . Mental illness Sister        Depression  . Mental illness Brother        Depression  . Diabetes Maternal Grandmother   . Hyperlipidemia Maternal Grandmother   . Hypertension Maternal Grandmother   .  Diabetes Maternal Grandfather   . Hypertension Maternal Grandfather   . Heart attack Maternal Grandfather   . Diabetes Paternal Grandmother   . Heart attack Paternal Grandfather     Past medical history, surgical history, medications, allergies, family history and social history reviewed with patient today and changes made to appropriate areas of the chart.   Review of Systems  Constitutional: Positive for diaphoresis and malaise/fatigue (napping repeatedly). Negative for chills, fever and weight loss.  HENT: Negative.   Eyes: Positive for blurred vision. Negative for double vision, photophobia, pain, discharge and redness.  Respiratory: Negative.   Cardiovascular: Negative.   Gastrointestinal: Negative.   Genitourinary: Negative.   Musculoskeletal: Negative.   Skin: Negative.   Neurological: Negative.  Negative for weakness.  Endo/Heme/Allergies: Negative.   Psychiatric/Behavioral: Positive for depression. Negative for hallucinations, memory loss, substance abuse and suicidal ideas. The patient has insomnia. The patient is not nervous/anxious.     All other ROS negative except what is listed above and in the HPI.      Objective:    BP 127/76 (BP Location: Left Arm, Patient Position: Sitting, Cuff Size: Large)   Pulse 73   Temp 98.3 F (36.8 C)   Ht 5' 3.3" (1.608 m)   Wt 246 lb 5 oz (111.7 kg)   SpO2 97%   BMI 43.22 kg/m   Wt Readings from Last 3 Encounters:  06/13/17 246 lb 5 oz (111.7 kg)  12/29/16 228 lb 1 oz (103.4 kg)  12/27/16 234 lb (106.1 kg)    Physical Exam  Results for orders placed or performed in visit on 02/10/17  TSH  Result Value Ref Range   TSH 3.980 0.450 - 4.500 uIU/mL      Assessment & Plan:   Problem List Items Addressed This Visit      Cardiovascular and Mediastinum   Migraines    Under good control since having her son. Continue to monitor. Call with any concerns.       Relevant Orders   CBC with Differential/Platelet    Comprehensive metabolic panel   UA/M w/rflx Culture, Routine     Endocrine   Hypothyroid    Rechecking levels today. Continue to monitor. Call with any concerns. Adjust medicine as needed.       Relevant Orders   CBC with Differential/Platelet   Comprehensive metabolic panel   TSH   UA/M w/rflx Culture, Routine     Other   HLD (hyperlipidemia)    Rechecking levels today. Continue to monitor. Call with any concerns. Treat as needed.       Relevant Orders   CBC with Differential/Platelet   Comprehensive metabolic panel  Lipid Panel w/o Chol/HDL Ratio   UA/M w/rflx Culture, Routine   Elevated LFTs    Rechecking levels today. Continue to monitor. Call with any concerns.      Relevant Orders   CBC with Differential/Platelet   Comprehensive metabolic panel   UA/M w/rflx Culture, Routine    Other Visit Diagnoses    Routine general medical examination at a health care facility    -  Primary   Vaccines up to date. Screening labs checked today. Pap up to date. Continue diet and exercise. Call with any concerns.    Relevant Orders   CBC with Differential/Platelet   Comprehensive metabolic panel   Lipid Panel w/o Chol/HDL Ratio   TSH   UA/M w/rflx Culture, Routine   Insomnia, unspecified type       Concern for issues with her thyroid vs. OSA- checking labs, if all normal, consider sleep study.       Follow up plan: Return in about 6 months (around 12/11/2017).   LABORATORY TESTING:  - Pap smear: up to date  IMMUNIZATIONS:   - Tdap: Tetanus vaccination status reviewed: last tetanus booster within 10 years. - Influenza: Up to date - Pneumovax: Not applicable  PATIENT COUNSELING:   Advised to take 1 mg of folate supplement per day if capable of pregnancy.   Sexuality: Discussed sexually transmitted diseases, partner selection, use of condoms, avoidance of unintended pregnancy  and contraceptive alternatives.   Advised to avoid cigarette smoking.  I discussed with  the patient that most people either abstain from alcohol or drink within safe limits (<=14/week and <=4 drinks/occasion for males, <=7/weeks and <= 3 drinks/occasion for females) and that the risk for alcohol disorders and other health effects rises proportionally with the number of drinks per week and how often a drinker exceeds daily limits.  Discussed cessation/primary prevention of drug use and availability of treatment for abuse.   Diet: Encouraged to adjust caloric intake to maintain  or achieve ideal body weight, to reduce intake of dietary saturated fat and total fat, to limit sodium intake by avoiding high sodium foods and not adding table salt, and to maintain adequate dietary potassium and calcium preferably from fresh fruits, vegetables, and low-fat dairy products.    stressed the importance of regular exercise  Injury prevention: Discussed safety belts, safety helmets, smoke detector, smoking near bedding or upholstery.   Dental health: Discussed importance of regular tooth brushing, flossing, and dental visits.    NEXT PREVENTATIVE PHYSICAL DUE IN 1 YEAR. Return in about 6 months (around 12/11/2017).

## 2017-06-13 NOTE — Patient Instructions (Addendum)

## 2017-06-13 NOTE — Assessment & Plan Note (Signed)
Rechecking levels today. Continue to monitor. Call with any concerns.  

## 2017-06-13 NOTE — Assessment & Plan Note (Signed)
Rechecking levels today. Continue to monitor. Call with any concerns. Treat as needed.

## 2017-06-14 ENCOUNTER — Telehealth: Payer: Self-pay | Admitting: Family Medicine

## 2017-06-14 DIAGNOSIS — E039 Hypothyroidism, unspecified: Secondary | ICD-10-CM

## 2017-06-14 LAB — CBC WITH DIFFERENTIAL/PLATELET
BASOS: 2 %
Basophils Absolute: 0.1 10*3/uL (ref 0.0–0.2)
EOS (ABSOLUTE): 0.4 10*3/uL (ref 0.0–0.4)
EOS: 5 %
HEMATOCRIT: 38.6 % (ref 34.0–46.6)
Hemoglobin: 12.8 g/dL (ref 11.1–15.9)
IMMATURE GRANS (ABS): 0 10*3/uL (ref 0.0–0.1)
IMMATURE GRANULOCYTES: 0 %
LYMPHS: 29 %
Lymphocytes Absolute: 2.1 10*3/uL (ref 0.7–3.1)
MCH: 27.8 pg (ref 26.6–33.0)
MCHC: 33.2 g/dL (ref 31.5–35.7)
MCV: 84 fL (ref 79–97)
MONOCYTES: 7 %
Monocytes Absolute: 0.5 10*3/uL (ref 0.1–0.9)
NEUTROS PCT: 57 %
Neutrophils Absolute: 4.2 10*3/uL (ref 1.4–7.0)
PLATELETS: 309 10*3/uL (ref 150–379)
RBC: 4.61 x10E6/uL (ref 3.77–5.28)
RDW: 14 % (ref 12.3–15.4)
WBC: 7.3 10*3/uL (ref 3.4–10.8)

## 2017-06-14 LAB — LIPID PANEL W/O CHOL/HDL RATIO
Cholesterol, Total: 292 mg/dL — ABNORMAL HIGH (ref 100–199)
HDL: 25 mg/dL — AB (ref >39)
Triglycerides: 463 mg/dL — ABNORMAL HIGH (ref 0–149)

## 2017-06-14 LAB — COMPREHENSIVE METABOLIC PANEL
A/G RATIO: 1.4 (ref 1.2–2.2)
ALT: 85 IU/L — AB (ref 0–32)
AST: 37 IU/L (ref 0–40)
Albumin: 4.5 g/dL (ref 3.5–5.5)
Alkaline Phosphatase: 57 IU/L (ref 39–117)
BUN/Creatinine Ratio: 16 (ref 9–23)
BUN: 10 mg/dL (ref 6–20)
Bilirubin Total: 0.8 mg/dL (ref 0.0–1.2)
CALCIUM: 9.6 mg/dL (ref 8.7–10.2)
CO2: 24 mmol/L (ref 20–29)
CREATININE: 0.64 mg/dL (ref 0.57–1.00)
Chloride: 101 mmol/L (ref 96–106)
GFR, EST AFRICAN AMERICAN: 138 mL/min/{1.73_m2} (ref >59)
GFR, EST NON AFRICAN AMERICAN: 120 mL/min/{1.73_m2} (ref >59)
GLOBULIN, TOTAL: 3.3 g/dL (ref 1.5–4.5)
Glucose: 72 mg/dL (ref 65–99)
POTASSIUM: 4.5 mmol/L (ref 3.5–5.2)
Sodium: 141 mmol/L (ref 134–144)
TOTAL PROTEIN: 7.8 g/dL (ref 6.0–8.5)

## 2017-06-14 LAB — TSH: TSH: 5.26 u[IU]/mL — AB (ref 0.450–4.500)

## 2017-06-14 MED ORDER — LEVOTHYROXINE SODIUM 25 MCG PO TABS: 25.0000 ug | ORAL_TABLET | Freq: Every day | ORAL | 1 refills | Status: DC

## 2017-06-14 NOTE — Telephone Encounter (Signed)
Patient notified

## 2017-06-14 NOTE — Telephone Encounter (Signed)
Please let her know that her labs came back normal, but her thyroid is under treated. I'm going to increase her to daily and recheck in 6 weeks. Everything else looks good except her cholesterol which is probably high because her thyroid is under treated. Order in.

## 2017-06-16 ENCOUNTER — Encounter: Payer: 59 | Admitting: Family Medicine

## 2017-07-22 ENCOUNTER — Encounter: Payer: Self-pay | Admitting: Family Medicine

## 2017-07-22 ENCOUNTER — Ambulatory Visit (INDEPENDENT_AMBULATORY_CARE_PROVIDER_SITE_OTHER): Payer: 59 | Admitting: Family Medicine

## 2017-07-22 ENCOUNTER — Other Ambulatory Visit: Payer: Self-pay

## 2017-07-22 VITALS — BP 125/78 | HR 80 | Wt 246.3 lb

## 2017-07-22 DIAGNOSIS — E782 Mixed hyperlipidemia: Secondary | ICD-10-CM

## 2017-07-22 DIAGNOSIS — J069 Acute upper respiratory infection, unspecified: Secondary | ICD-10-CM | POA: Diagnosis not present

## 2017-07-22 DIAGNOSIS — R945 Abnormal results of liver function studies: Secondary | ICD-10-CM

## 2017-07-22 DIAGNOSIS — E039 Hypothyroidism, unspecified: Secondary | ICD-10-CM | POA: Diagnosis not present

## 2017-07-22 DIAGNOSIS — R7989 Other specified abnormal findings of blood chemistry: Secondary | ICD-10-CM

## 2017-07-22 MED ORDER — PREDNISONE 50 MG PO TABS: 50.0000 mg | ORAL_TABLET | Freq: Every day | ORAL | 0 refills | Status: DC

## 2017-07-22 NOTE — Assessment & Plan Note (Signed)
Feeling better. Will check labs. Adjust dose as needed. Call with any concerns.

## 2017-07-22 NOTE — Progress Notes (Signed)
BP 125/78 (BP Location: Left Arm, Patient Position: Sitting, Cuff Size: Large)   Pulse 80   Wt 246 lb 4.1 oz (111.7 kg)   LMP 07/17/2017 (Exact Date)   Breastfeeding? No   BMI 43.21 kg/m    Subjective:    Patient ID: Cynthia George, female    DOB: 1986-04-06, 32 y.o.   MRN: [id number]  HPI: Cynthia George is a 32 y.o. female  Chief Complaint  Patient presents with  . Hypothyroidism  . Hyperlipidemia  . URI   HYPOTHYROIDISM Thyroid control status:better Satisfied with current treatment? yes Medication side effects: no Medication compliance: excellent compliance Etiology of hypothyroidism:  Recent dose adjustment:yes Fatigue: no Cold intolerance: no Heat intolerance: yes Weight gain: no Weight loss: no Constipation: no Diarrhea/loose stools: no Palpitations: no Lower extremity edema: no Anxiety/depressed mood: no  HYPERLIPIDEMIA Hyperlipidemia status: Not on anything Satisfied with current treatment?  yes Aspirin:  no The ASCVD Risk score Cynthia George(Goff DC Jr., et al., 2013) failed to calculate for the following reasons:   The 2013 ASCVD risk score is only valid for ages 3640 to 7479 Chest pain:  no Coronary artery disease:  no Family history CAD:  yes  UPPER RESPIRATORY TRACT INFECTION Duration: Last week Worst symptom: congestion Fever: no Cough: no Shortness of breath: no Wheezing: no Chest pain: no Chest tightness: no Chest congestion: no Nasal congestion: yes- better now Runny nose: yes Post nasal drip: no Sneezing: yes Sore throat: no Swollen glands: no Sinus pressure: yes Headache: yes Face pain: no Toothache: no Ear pain: yes left Ear pressure: yes left Eyes red/itching:no Eye drainage/crusting: no  Vomiting: no Rash: no Fatigue: no Sick contacts: yes Strep contacts: no  Context: better Recurrent sinusitis: no Relief with OTC cold/cough medications: yes  Treatments attempted: zycam    Relevant past medical, surgical, family and social  history reviewed and updated as indicated. Interim medical history since our last visit reviewed. Allergies and medications reviewed and updated.  Review of Systems  Constitutional: Negative.   Respiratory: Negative.   Cardiovascular: Negative.   Gastrointestinal: Negative.   Musculoskeletal: Negative.   Psychiatric/Behavioral: Negative.     Per HPI unless specifically indicated above     Objective:    BP 125/78 (BP Location: Left Arm, Patient Position: Sitting, Cuff Size: Large)   Pulse 80   Wt 246 lb 4.1 oz (111.7 kg)   LMP 07/17/2017 (Exact Date)   Breastfeeding? No   BMI 43.21 kg/m   Wt Readings from Last 3 Encounters:  07/22/17 246 lb 4.1 oz (111.7 kg)  06/13/17 246 lb 5 oz (111.7 kg)  12/29/16 228 lb 1 oz (103.4 kg)    Physical Exam  Constitutional: She is oriented to person, place, and time. She appears well-developed and well-nourished. No distress.  HENT:  Head: Normocephalic and atraumatic.  Right Ear: Hearing, tympanic membrane, external ear and ear canal normal.  Left Ear: Hearing, tympanic membrane, external ear and ear canal normal.  Nose: Nose normal.  Mouth/Throat: Uvula is midline, oropharynx is clear and moist and mucous membranes are normal. No oropharyngeal exudate.  Eyes: Pupils are equal, round, and reactive to light. Conjunctivae, EOM and lids are normal. Right eye exhibits no discharge. Left eye exhibits no discharge. No scleral icterus.  Neck: Normal range of motion. Neck supple. No JVD present. No tracheal deviation present. No thyromegaly present.  Cardiovascular: Normal rate, regular rhythm, normal heart sounds and intact distal pulses. Exam reveals no gallop and no friction rub.  No murmur heard. Pulmonary/Chest: Effort normal and breath sounds normal. No stridor. No respiratory distress. She has no wheezes. She has no rales. She exhibits no tenderness.  Musculoskeletal: Normal range of motion.  Lymphadenopathy:    She has no cervical  adenopathy.  Neurological: She is alert and oriented to person, place, and time.  Skin: Skin is warm, dry and intact. No rash noted. She is not diaphoretic. No erythema. No pallor.  Psychiatric: She has a normal mood and affect. Her speech is normal and behavior is normal. Judgment and thought content normal. Cognition and memory are normal.  Nursing note and vitals reviewed.   Results for orders placed or performed in visit on 06/13/17  CBC with Differential/Platelet  Result Value Ref Range   WBC 7.3 3.4 - 10.8 x10E3/uL   RBC 4.61 3.77 - 5.28 x10E6/uL   Hemoglobin 12.8 11.1 - 15.9 g/dL   Hematocrit 16.1 09.6 - 46.6 %   MCV 84 79 - 97 fL   MCH 27.8 26.6 - 33.0 pg   MCHC 33.2 31.5 - 35.7 g/dL   RDW 04.5 40.9 - 81.1 %   Platelets 309 150 - 379 x10E3/uL   Neutrophils 57 Not Estab. %   Lymphs 29 Not Estab. %   Monocytes 7 Not Estab. %   Eos 5 Not Estab. %   Basos 2 Not Estab. %   Neutrophils Absolute 4.2 1.4 - 7.0 x10E3/uL   Lymphocytes Absolute 2.1 0.7 - 3.1 x10E3/uL   Monocytes Absolute 0.5 0.1 - 0.9 x10E3/uL   EOS (ABSOLUTE) 0.4 0.0 - 0.4 x10E3/uL   Basophils Absolute 0.1 0.0 - 0.2 x10E3/uL   Immature Granulocytes 0 Not Estab. %   Immature Grans (Abs) 0.0 0.0 - 0.1 x10E3/uL  Comprehensive metabolic panel  Result Value Ref Range   Glucose 72 65 - 99 mg/dL   BUN 10 6 - 20 mg/dL   Creatinine, Ser 9.14 0.57 - 1.00 mg/dL   GFR calc non Af Amer 120 >59 mL/min/1.73   GFR calc Af Amer 138 >59 mL/min/1.73   BUN/Creatinine Ratio 16 9 - 23   Sodium 141 134 - 144 mmol/L   Potassium 4.5 3.5 - 5.2 mmol/L   Chloride 101 96 - 106 mmol/L   CO2 24 20 - 29 mmol/L   Calcium 9.6 8.7 - 10.2 mg/dL   Total Protein 7.8 6.0 - 8.5 g/dL   Albumin 4.5 3.5 - 5.5 g/dL   Globulin, Total 3.3 1.5 - 4.5 g/dL   Albumin/Globulin Ratio 1.4 1.2 - 2.2   Bilirubin Total 0.8 0.0 - 1.2 mg/dL   Alkaline Phosphatase 57 39 - 117 IU/L   AST 37 0 - 40 IU/L   ALT 85 (H) 0 - 32 IU/L  Lipid Panel w/o Chol/HDL Ratio    Result Value Ref Range   Cholesterol, Total 292 (H) 100 - 199 mg/dL   Triglycerides 782 (H) 0 - 149 mg/dL   HDL 25 (L) >95 mg/dL   VLDL Cholesterol Cal Comment 5 - 40 mg/dL   LDL Calculated Comment 0 - 99 mg/dL  TSH  Result Value Ref Range   TSH 5.260 (H) 0.450 - 4.500 uIU/mL  UA/M w/rflx Culture, Routine  Result Value Ref Range   Specific Gravity, UA 1.025 1.005 - 1.030   pH, UA 5.5 5.0 - 7.5   Color, UA Yellow Yellow   Appearance Ur Hazy (A) Clear   Leukocytes, UA Negative Negative   Protein, UA Trace (A) Negative/Trace   Glucose, UA  Negative Negative   Ketones, UA Negative Negative   RBC, UA Negative Negative   Bilirubin, UA Negative Negative   Urobilinogen, Ur 0.2 0.2 - 1.0 mg/dL   Nitrite, UA Negative Negative      Assessment & Plan:   Problem List Items Addressed This Visit      Endocrine   Hypothyroid - Primary    Feeling better. Will check labs. Adjust dose as needed. Call with any concerns.       Relevant Orders   Thyroid Panel With TSH     Other   HLD (hyperlipidemia)    Rechecking levels today. Await results. Call with any concerns.       Relevant Orders   Comprehensive metabolic panel   Lipid Panel w/o Chol/HDL Ratio   Elevated LFTs    Rechecking levels today. Await results. Call with any concerns.       Relevant Orders   Comprehensive metabolic panel    Other Visit Diagnoses    Upper respiratory tract infection, unspecified type       Appears to be resolving. Fluid behind her ears. Will treat with prednisone burst, call if not getting better or getting worse.        Follow up plan: Return End of August for 6 month follow up.

## 2017-07-22 NOTE — Assessment & Plan Note (Signed)
Rechecking levels today. Await results. Call with any concerns.  

## 2017-07-23 ENCOUNTER — Telehealth: Payer: Self-pay | Admitting: Family Medicine

## 2017-07-23 DIAGNOSIS — E039 Hypothyroidism, unspecified: Secondary | ICD-10-CM

## 2017-07-23 LAB — COMPREHENSIVE METABOLIC PANEL
ALK PHOS: 55 IU/L (ref 39–117)
ALT: 90 IU/L — AB (ref 0–32)
AST: 44 IU/L — AB (ref 0–40)
Albumin/Globulin Ratio: 1.4 (ref 1.2–2.2)
Albumin: 4.4 g/dL (ref 3.5–5.5)
BILIRUBIN TOTAL: 0.8 mg/dL (ref 0.0–1.2)
BUN/Creatinine Ratio: 16 (ref 9–23)
BUN: 11 mg/dL (ref 6–20)
CHLORIDE: 102 mmol/L (ref 96–106)
CO2: 23 mmol/L (ref 20–29)
Calcium: 9.6 mg/dL (ref 8.7–10.2)
Creatinine, Ser: 0.7 mg/dL (ref 0.57–1.00)
GFR calc Af Amer: 134 mL/min/{1.73_m2} (ref >59)
GFR calc non Af Amer: 116 mL/min/{1.73_m2} (ref >59)
GLUCOSE: 92 mg/dL (ref 65–99)
Globulin, Total: 3.1 g/dL (ref 1.5–4.5)
Potassium: 4.5 mmol/L (ref 3.5–5.2)
Sodium: 139 mmol/L (ref 134–144)
Total Protein: 7.5 g/dL (ref 6.0–8.5)

## 2017-07-23 LAB — LIPID PANEL W/O CHOL/HDL RATIO
CHOLESTEROL TOTAL: 271 mg/dL — AB (ref 100–199)
HDL: 26 mg/dL — AB (ref >39)
Triglycerides: 406 mg/dL — ABNORMAL HIGH (ref 0–149)

## 2017-07-23 LAB — THYROID PANEL WITH TSH
Free Thyroxine Index: 1.4 (ref 1.2–4.9)
T3 UPTAKE RATIO: 22 % — AB (ref 24–39)
T4, Total: 6.4 ug/dL (ref 4.5–12.0)
TSH: 5.13 u[IU]/mL — ABNORMAL HIGH (ref 0.450–4.500)

## 2017-07-23 MED ORDER — LEVOTHYROXINE SODIUM 50 MCG PO TABS: 50.0000 ug | ORAL_TABLET | Freq: Every day | ORAL | 1 refills | Status: DC

## 2017-07-23 NOTE — Telephone Encounter (Signed)
Please let Kennith Centerracey know that things are better, but not quite there. I'm going to increase her thyroid medicine to 50mcg and we'll recheck in 6 weeks. I've sent a new Rx to her pharmacy, but she can take 2 of your 25mcg until she can pick it up. I'll see her in 6 weeks to recheck your thyroid.

## 2017-07-25 NOTE — Telephone Encounter (Signed)
Called and left a message asking patient to return my call to get her lab results.  Please release results when patient calls back.

## 2017-07-31 ENCOUNTER — Encounter: Payer: Self-pay | Admitting: Family Medicine

## 2017-08-06 DIAGNOSIS — H5212 Myopia, left eye: Secondary | ICD-10-CM | POA: Diagnosis not present

## 2017-08-06 DIAGNOSIS — H52223 Regular astigmatism, bilateral: Secondary | ICD-10-CM | POA: Diagnosis not present

## 2017-09-15 ENCOUNTER — Other Ambulatory Visit: Payer: 59

## 2017-09-15 DIAGNOSIS — E039 Hypothyroidism, unspecified: Secondary | ICD-10-CM | POA: Diagnosis not present

## 2017-09-16 ENCOUNTER — Telehealth: Payer: Self-pay | Admitting: Family Medicine

## 2017-09-16 ENCOUNTER — Other Ambulatory Visit: Payer: Self-pay | Admitting: Family Medicine

## 2017-09-16 DIAGNOSIS — E039 Hypothyroidism, unspecified: Secondary | ICD-10-CM

## 2017-09-16 LAB — THYROID PANEL WITH TSH
FREE THYROXINE INDEX: 1.4 (ref 1.2–4.9)
T3 UPTAKE RATIO: 21 % — AB (ref 24–39)
T4, Total: 6.9 ug/dL (ref 4.5–12.0)
TSH: 3.8 u[IU]/mL (ref 0.450–4.500)

## 2017-09-16 MED ORDER — LEVOTHYROXINE SODIUM 75 MCG PO TABS: 75.0000 ug | ORAL_TABLET | Freq: Every day | ORAL | 1 refills | Status: DC

## 2017-09-16 NOTE — Telephone Encounter (Signed)
Please let her know that her thyroid looks good, but not quite there. I'm increasing her to and we'll recheck 1 more time in 6 weeks. Rx at [facility] and Order in. Have a great day!

## 2017-09-16 NOTE — Telephone Encounter (Signed)
Patient notified

## 2017-09-30 ENCOUNTER — Encounter: Payer: Self-pay | Admitting: Physician Assistant

## 2017-09-30 ENCOUNTER — Ambulatory Visit (INDEPENDENT_AMBULATORY_CARE_PROVIDER_SITE_OTHER): Payer: 59 | Admitting: Physician Assistant

## 2017-09-30 VITALS — BP 109/68 | HR 80 | Temp 98.5°F | Ht 63.0 in | Wt 251.5 lb

## 2017-09-30 DIAGNOSIS — H938X2 Other specified disorders of left ear: Secondary | ICD-10-CM | POA: Diagnosis not present

## 2017-09-30 MED ORDER — FLUTICASONE PROPIONATE 50 MCG/ACT NA SUSP: 2.0000 | Freq: Every day | NASAL | 6 refills | Status: DC

## 2017-09-30 NOTE — Patient Instructions (Signed)
Barotitis Media Barotitis media is inflammation of the middle ear. This condition occurs when an auditory tube (eustachian tube) is blocked in one or both ears. These tubes lead from the middle ear to the back of the nose (nasopharynx). This condition typically occurs when you experience changes in pressure, such as when flying or scuba diving. Untreated barotitis media may lead to damage or hearing loss (barotrauma), which may become permanent. What are the causes? This condition may be caused by changes in air pressure from:  Flying.  Scuba diving.  A nearby explosion. What increases the risk? The following factors may make you more likely to develop this condition:  Middle ear infection.  Sinus infection.  A cold.  Environmental allergies.  Small eustachian tubes.  Recent ear surgery. What are the signs or symptoms? Symptoms of this condition may include:  Ear pain.  Hearing loss. In severe cases, symptoms can include:  Dizziness and nausea (vertigo).  Temporary facial paralysis. How is this diagnosed? This condition is diagnosed based on:  A physical exam. Your health care provider may:  Use a device (otoscope) to look into your ear canal and check your eardrum.  Do a test that changes air pressure in the middle ear to check how well the eardrum moves and to see if the eustachian tube is working(tympanogram).  Your medical history. In some cases, your health care provider may have you take a hearing test. You may also be referred to someone who specializes in ear treatment (otolaryngologist, "ENT"). How is this treated? This condition may be treated with:  Medicines to relieve congestion in your nose, sinus, or upper respiratory tract (decongestants).  Techniques to equalize pressure (to "pop" your ears), such as:  Yawning.  Chewing gum.  Swallowing. In severe cases, you may need surgery to relieve your symptoms or to prevent future inflammation. Follow  these instructions at home:  Take over-the-counter and prescription medicines only as told by your health care provider.  Do not put anything into your ears to clean or unplug them. Ear drops will not help.  Keep all follow-up visits as told by your health care provider. This is important. How is this prevented? Using these strategies may help to prevent barotitis media:  Chewing gum with frequent, forceful swallowing during takeoff and landing when flying.  Holding your nose and gently blowing to pop your ears for equalizing pressure changes. This forces air into the eustachian tube.  Yawning during air pressure changes.  Using a nasal decongestant about 30-60 minutes before flying, if you have nasal congestion. Contact a health care provider if:  You have vertigo.  You have hearing loss.  Your symptoms do not get better or they get worse.  You have a fever. Get help right away if:  You have a severe headache, ear pain, and dizziness.  You have balance problems.  You cannot move or feel part of your face.  You have bloody or pus-like drainage from your ears. Summary  Barotitis media is inflammation of the middle ear.  This condition typically occurs when you experience changes in pressure, such as when flying or scuba diving.  You may be at a higher risk for this condition if you have small eustachian tubes, had recent ear surgery, or have allergies, a cold, or sinus or middle ear infection.  This condition may be treated with medicines or techniques to equalize pressure in your ears.  Strategies can be used to help prevent barotitis media. This information is   not intended to replace advice given to you by your health care provider. Make sure you discuss any questions you have with your health care provider. Document Released: 04/02/2000 Document Revised: 02/23/2016 Document Reviewed: 02/23/2016 Elsevier Interactive Patient Education  2017 [employer]  

## 2017-09-30 NOTE — Progress Notes (Signed)
   Subjective:    Patient ID: Cynthia George, female    DOB: [birth year], 32 y.o.   MRN: [id number]  Cynthia George is a 32 y.o. female presenting on 09/30/2017 for Ear Pain (Left ear, with pressure.)   HPI   Left ear pain, pressure x 1 day. Reports drainage. Denies fevers, chills, sick contacts. Swam two weeks ago. Sore throat occasionally when swallowing. Left sided swollen gland. No problems breathing.  Social History   Tobacco Use  . Smoking status: Never Smoker  . Smokeless tobacco: Never Used  Substance Use Topics  . Alcohol use: No  . Drug use: No    Review of Systems Per HPI unless specifically indicated above     Objective:    BP 109/68 (BP Location: Right Arm, Patient Position: Sitting, Cuff Size: Normal)   Pulse 80   Temp 98.5 F (36.9 C) (Oral)   Ht 5\' 3"  (1.6 m)   Wt 251 lb 8 oz (114.1 kg)   SpO2 97%   BMI 44.55 kg/m   Wt Readings from Last 3 Encounters:  09/30/17 251 lb 8 oz (114.1 kg)  07/22/17 246 lb 4.1 oz (111.7 kg)  06/13/17 246 lb 5 oz (111.7 kg)    Physical Exam  Constitutional: She is oriented to person, place, and time. She appears well-developed and well-nourished. No distress.  HENT:  Right Ear: External ear and ear canal normal. No drainage. Tympanic membrane is not erythematous.  Left Ear: External ear and ear canal normal. No drainage. Tympanic membrane is not erythematous.  Mouth/Throat: Oropharynx is clear and moist. No oropharyngeal exudate.  Left Tm slightly opaque.  Eyes: Conjunctivae are normal.  Neck:  Single mobile enlarged lymph node on left side, TTP.  Cardiovascular: Normal rate and regular rhythm.  Pulmonary/Chest: Effort normal and breath sounds normal.  Lymphadenopathy:    She has cervical adenopathy.  Neurological: She is alert and oriented to person, place, and time.  Skin: Skin is warm and dry.  Psychiatric: She has a normal mood and affect. Her behavior is normal.   Results for orders placed or performed in visit on  09/15/17  Thyroid Panel With TSH  Result Value Ref Range   TSH 3.800 0.450 - 4.500 uIU/mL   T4, Total 6.9 4.5 - 12.0 ug/dL   T3 Uptake Ratio 21 (L) 24 - 39 %   Free Thyroxine Index 1.4 1.2 - 4.9      Assessment & Plan:  1. Ear pressure, left  Eustachian tube dysfunction from viral vs. Allergies. Should try flonase as below. Daily 2nd generation antihistamine.  - fluticasone (FLONASE) 50 MCG/ACT nasal spray; Place 2 sprays into both nostrils daily.  Dispense: 16 g; Refill: 6    Follow up plan: Return if symptoms worsen or fail to improve.  Osvaldo AngstAdriana Jasiah Elsen, PA-C [facility] 09/30/2017, 3:25 PM

## 2017-11-24 ENCOUNTER — Other Ambulatory Visit: Payer: 59

## 2017-11-24 DIAGNOSIS — E039 Hypothyroidism, unspecified: Secondary | ICD-10-CM

## 2017-11-25 ENCOUNTER — Other Ambulatory Visit: Payer: Self-pay | Admitting: Family Medicine

## 2017-11-25 ENCOUNTER — Encounter: Payer: Self-pay | Admitting: Family Medicine

## 2017-11-25 DIAGNOSIS — E039 Hypothyroidism, unspecified: Secondary | ICD-10-CM

## 2017-11-25 LAB — TSH: TSH: 3.89 u[IU]/mL (ref 0.450–4.500)

## 2017-11-25 MED ORDER — LEVOTHYROXINE SODIUM 75 MCG PO TABS: 75.0000 ug | ORAL_TABLET | Freq: Every day | ORAL | 3 refills | Status: DC

## 2017-12-15 ENCOUNTER — Ambulatory Visit: Payer: 59 | Admitting: Family Medicine

## 2018-02-08 ENCOUNTER — Encounter: Payer: Self-pay | Admitting: Family Medicine

## 2018-02-08 ENCOUNTER — Ambulatory Visit (INDEPENDENT_AMBULATORY_CARE_PROVIDER_SITE_OTHER): Payer: 59 | Admitting: Family Medicine

## 2018-02-08 VITALS — BP 125/69 | HR 82 | Temp 98.9°F | Wt 256.5 lb

## 2018-02-08 DIAGNOSIS — H6982 Other specified disorders of Eustachian tube, left ear: Secondary | ICD-10-CM | POA: Diagnosis not present

## 2018-02-08 DIAGNOSIS — R945 Abnormal results of liver function studies: Secondary | ICD-10-CM | POA: Diagnosis not present

## 2018-02-08 DIAGNOSIS — F325 Major depressive disorder, single episode, in full remission: Secondary | ICD-10-CM | POA: Diagnosis not present

## 2018-02-08 DIAGNOSIS — E782 Mixed hyperlipidemia: Secondary | ICD-10-CM

## 2018-02-08 DIAGNOSIS — G43109 Migraine with aura, not intractable, without status migrainosus: Secondary | ICD-10-CM

## 2018-02-08 DIAGNOSIS — E039 Hypothyroidism, unspecified: Secondary | ICD-10-CM

## 2018-02-08 DIAGNOSIS — R7989 Other specified abnormal findings of blood chemistry: Secondary | ICD-10-CM

## 2018-02-08 NOTE — Assessment & Plan Note (Signed)
Stable. Not having issues. Continue to monitor.

## 2018-02-08 NOTE — Assessment & Plan Note (Signed)
Rechecking levels today. Will treat as needed. Call with any concerns.  

## 2018-02-08 NOTE — Progress Notes (Signed)
BP 125/69 (BP Location: Left Arm, Patient Position: Sitting, Cuff Size: Large)   Pulse 82   Temp 98.9 F (37.2 C)   Wt 256 lb 8 oz (116.3 kg)   SpO2 97%   BMI 45.44 kg/m    Subjective:    Patient ID: Cynthia George, female    DOB: 01/02/1986, 32 y.o.   MRN: [id number]  HPI: Cynthia George is a 32 y.o. female  Chief Complaint  Patient presents with  . Other    Patient would like to come in tomorrow for fasting labs   WEIGHT GAIN Duration: chronic Previous attempts at weight loss: diet and exercise, weight watchers Complications of obesity: HLD, elevated liver enzymes Peak weight: current Weight loss goal: 30lbs Weight loss to date: none Requesting obesity pharmacotherapy: yes Current weight loss supplements/medications: no Previous weight loss supplements/meds: no  HYPERLIPIDEMIA Hyperlipidemia status: stable Satisfied with current treatment?  no Side effects:  Not on anything Aspirin:  no The ASCVD Risk score [name], et al., 2013) failed to calculate for the following reasons:   The 2013 ASCVD risk score is only valid for ages 27 to 49 Chest pain:  no Coronary artery disease:  no  DEPRESSION Mood status: stable Satisfied with current treatment?: yes Symptom severity: mild  Duration of current treatment : Not on anything Psychotherapy/counseling: no  Depressed mood: no Anxious mood: no Anhedonia: no Significant weight loss or gain: no Insomnia: no  Fatigue: no Feelings of worthlessness or guilt: no Impaired concentration/indecisiveness: no Suicidal ideations: no Hopelessness: no Crying spells: no Depression screen [facility] 2/9 02/08/2018 06/13/2017 05/27/2016 05/26/2015  Decreased Interest 1 0 0 1  Down, Depressed, Hopeless 1 0 0 1  PHQ - 2 Score 2 0 0 2  Altered sleeping 3 3 1  -  Tired, decreased energy 2 3 1  -  Change in appetite 0 2 0 -  Feeling bad or failure about yourself  0 0 0 -  Trouble concentrating 0 0 0 -  Moving slowly or fidgety/restless  0 0 0 -  Suicidal thoughts 0 0 0 -  PHQ-9 Score 7 8 2  -  Difficult doing work/chores Not difficult at all Not difficult at all - -   HYPOTHYROIDISM Thyroid control status:stable Satisfied with current treatment? yes Medication side effects: no Medication compliance: excellent compliance Recent dose adjustment:no Fatigue: yes Cold intolerance: no Heat intolerance: yes Weight gain: no Weight loss: no Constipation: no Diarrhea/loose stools: no Palpitations: yes Lower extremity edema: no Anxiety/depressed mood: no  Relevant past medical, surgical, family and social history reviewed and updated as indicated. Interim medical history since our last visit reviewed. Allergies and medications reviewed and updated.  Review of Systems  Constitutional: Negative.   HENT: Positive for ear pain. Negative for congestion, dental problem, drooling, ear discharge, facial swelling, hearing loss, mouth sores, nosebleeds, postnasal drip, rhinorrhea, sinus pressure, sinus pain, sneezing, sore throat, tinnitus, trouble swallowing and voice change.   Respiratory: Negative.   Cardiovascular: Negative.   Musculoskeletal: Negative.   Psychiatric/Behavioral: Negative.     Per HPI unless specifically indicated above     Objective:    BP 125/69 (BP Location: Left Arm, Patient Position: Sitting, Cuff Size: Large)   Pulse 82   Temp 98.9 F (37.2 C)   Wt 256 lb 8 oz (116.3 kg)   SpO2 97%   BMI 45.44 kg/m   Wt Readings from Last 3 Encounters:  02/08/18 256 lb 8 oz (116.3 kg)  09/30/17 251 lb  8 oz (114.1 kg)  07/22/17 246 lb 4.1 oz (111.7 kg)    Physical Exam  Constitutional: She is oriented to person, place, and time. She appears well-developed and well-nourished. No distress.  HENT:  Head: Normocephalic and atraumatic.  Right Ear: Hearing and external ear normal.  Left Ear: Hearing and external ear normal.  Nose: Nose normal.  Mouth/Throat: Oropharynx is clear and moist. No oropharyngeal  exudate.  Eyes: Pupils are equal, round, and reactive to light. Conjunctivae, EOM and lids are normal. Right eye exhibits no discharge. Left eye exhibits no discharge. No scleral icterus.  Neck: Normal range of motion. Neck supple. No JVD present. No tracheal deviation present. No thyromegaly present.  Cardiovascular: Normal rate, regular rhythm, normal heart sounds and intact distal pulses. Exam reveals no gallop and no friction rub.  No murmur heard. Pulmonary/Chest: Effort normal and breath sounds normal. No stridor. No respiratory distress. She has no wheezes. She has no rales. She exhibits no tenderness.  Musculoskeletal: Normal range of motion.  Lymphadenopathy:    She has no cervical adenopathy.  Neurological: She is alert and oriented to person, place, and time.  Skin: Skin is warm, dry and intact. Capillary refill takes less than 2 seconds. No rash noted. She is not diaphoretic. No erythema. No pallor.  Psychiatric: She has a normal mood and affect. Her speech is normal and behavior is normal. Judgment and thought content normal. Cognition and memory are normal.  Nursing note and vitals reviewed.   Results for orders placed or performed in visit on 11/24/17  TSH  Result Value Ref Range   TSH 3.890 0.450 - 4.500 uIU/mL      Assessment & Plan:   Problem List Items Addressed This Visit      Cardiovascular and Mediastinum   Migraines - Primary    Stable. Not having issues. Continue to monitor.       Relevant Orders   CBC with Differential/Platelet   Comprehensive metabolic panel     Endocrine   Hypothyroid    Rechecking levels today. Will treat as needed. Call with any concerns.       Relevant Orders   CBC with Differential/Platelet   Comprehensive metabolic panel   TSH     Other   HLD (hyperlipidemia)    Rechecking levels today. Will treat as needed. Call with any concerns.       Relevant Orders   CBC with Differential/Platelet   Comprehensive metabolic panel     Lipid Panel w/o Chol/HDL Ratio   Depression    Doing well. In remission. Call with any concerns.       Elevated LFTs    Rechecking levels today. Will treat as needed. Call with any concerns.       Relevant Orders   CBC with Differential/Platelet   Comprehensive metabolic panel   Morbid obesity (HCC)    Discussed options. Will look them over and consider. Call with any concerns.        Other Visit Diagnoses    Dysfunction of left eustachian tube       Restart flonase       Follow up plan: Return 1 month after starting weight loss medicine.

## 2018-02-08 NOTE — Patient Instructions (Signed)
Lorcaserin oral tablets What is this medicine? LORCASERIN (lor ca SER in) is used to promote and maintain weight loss in obese patients. This medicine should be used with a reduced calorie diet and, if appropriate, an exercise program. This medicine may be used for other purposes; ask your health care provider or pharmacist if you have questions. COMMON BRAND NAME(S): Belviq What should I tell my health care provider before I take this medicine? They need to know if you have any of these conditions: -anatomical deformation of the penis, Peyronie's disease, or history of priapism (painful and prolonged erection) -diabetes -heart disease -history of blood diseases, like sickle cell anemia or leukemia -history of irregular heartbeat -kidney disease -liver disease -suicidal thoughts, plans, or attempt; a previous suicide attempt by you or a family member -an unusual or allergic reaction to lorcaserin, other medicines, foods, dyes, or preservatives -pregnant or trying to get pregnant -breast-feeding How should I use this medicine? Take this medicine by mouth with a glass of water. Follow the directions on the prescription label. You can take it with or without food. Take your medicine at regular intervals. Do not take it more often than directed. Do not stop taking except on your doctor's advice. Talk to your pediatrician regarding the use of this medicine in children. Special care may be needed. Overdosage: If you think you have taken too much of this medicine contact a poison control center or emergency room at once. NOTE: This medicine is only for you. Do not share this medicine with others. What if I miss a dose? If you miss a dose, take it as soon as you can. If it is almost time for your next dose, take only that dose. Do not take double or extra doses. What may interact with this medicine? -cabergoline -certain medicines for depression, anxiety, or psychotic disturbances -certain  medicines for erectile dysfunction -certain medicines for migraine headache like almotriptan, eletriptan, frovatriptan, naratriptan, rizatriptan, sumatriptan, zolmitriptan -dextromethorphan -linezolid -lithium -medicines for diabetes -other weight loss products -tramadol -St. John's Wort -stimulant medicines for attention disorders, weight loss, or to stay awake -tryptophan This list may not describe all possible interactions. Give your health care provider a list of all the medicines, herbs, non-prescription drugs, or dietary supplements you use. Also tell them if you smoke, drink alcohol, or use illegal drugs. Some items may interact with your medicine. What should I watch for while using this medicine? This medicine is intended to be used in addition to a healthy diet and appropriate exercise. The best results are achieved this way. Your doctor should instruct you to stop taking this medicine if you do not lose a certain amount of weight within the first 12 weeks of treatment, but it is important that you do not change your dose in any way without consulting your doctor or health care professional. Visit your doctor or health care professional for regular checkups. Your doctor may order blood tests or other tests to see how you are doing. Do not drive, use machinery, or do anything that needs mental alertness until you know how this medicine affects you. This medicine may affect blood sugar levels. If you have diabetes, check with your doctor or health care professional before you change your diet or the dose of your diabetic medicine. Patients and their families should watch out for worsening depression or thoughts of suicide. Also watch out for sudden changes in feelings such as feeling anxious, agitated, panicky, irritable, hostile, aggressive, impulsive, severely restless, overly   excited and hyperactive, or not being able to sleep. If this happens, especially at the beginning of treatment or  after a change in dose, call your health care professional. Contact your doctor or health care professional right away if you are a man with an erection that lasts longer than 4 hours or if the erection becomes painful. This may be a sign of serious problem and must be treated right away to prevent permanent damage. What side effects may I notice from receiving this medicine? Side effects that you should report to your doctor or health care professional as soon as possible: -allergic reactions like skin rash, itching or hives, swelling of the face, lips, or tongue -abnormal production of milk -breast enlargement in both males and females -breathing problems -changes in emotions or moods -changes in vision -confusion -erection lasting more than 4 hours or a painful erection -fast or irregular heart beat -feeling faint or lightheaded, falls -fever or chills, sore throat -hallucination, loss of contact with reality -high or low blood pressure -menstrual changes -restlessness -slow or irregular heartbeat -stiff muscles -sweating -suicidal thoughts or other mood changes -swelling of the ankles, feet, hands -unusually weak or tired -vomiting Side effects that usually do not require medical attention (report to your doctor or health care professional if they continue or are bothersome): -back pain -constipation -cough -dry mouth -nausea -tiredness This list may not describe all possible side effects. Call your doctor for medical advice about side effects. You may report side effects to FDA at 1-800-FDA-1088. Where should I keep my medicine? Keep out of the reach of children. This medicine can be abused. Keep your medicine in a safe place to protect it from theft. Do not share this medicine with anyone. Selling or giving away this medicine is dangerous and against the law. Store at room temperature between 15 and 30 degrees C (59 and 86 degrees F). Throw away any unused medicine after the  expiration date. NOTE: This sheet is a summary. It may not cover all possible information. If you have questions about this medicine, talk to your doctor, pharmacist, or health care provider.  2018 Elsevier/Gold Standard (2015-05-08 12:13:31) Liraglutide injection (Weight Management) What is this medicine? LIRAGLUTIDE (LIR a GLOO tide) is used with a reduced calorie diet and exercise to help you lose weight. This medicine may be used for other purposes; ask your health care provider or pharmacist if you have questions. COMMON BRAND NAME(S): Saxenda What should I tell my health care provider before I take this medicine? They need to know if you have any of these conditions: -endocrine tumors (MEN 2) or if someone in your family had these tumors -gallbladder disease -high cholesterol -history of alcohol abuse problem -history of pancreatitis -kidney disease or if you are on dialysis -liver disease -previous swelling of the tongue, face, or lips with difficulty breathing, difficulty swallowing, hoarseness, or tightening of the throat -stomach problems -suicidal thoughts, plans, or attempt; a previous suicide attempt by you or a family member -thyroid cancer or if someone in your family had thyroid cancer -an unusual or allergic reaction to liraglutide, other medicines, foods, dyes, or preservatives -pregnant or trying to get pregnant -breast-feeding How should I use this medicine? This medicine is for injection under the skin of your upper leg, stomach area, or upper arm. You will be taught how to prepare and give this medicine. Use exactly as directed. Take your medicine at regular intervals. Do not take it more often than directed. It  is important that you put your used needles and syringes in a special sharps container. Do not put them in a trash can. If you do not have a sharps container, call your pharmacist or healthcare provider to get one. A special MedGuide will be given to you by  the pharmacist with each prescription and refill. Be sure to read this information carefully each time. Talk to your pediatrician regarding the use of this medicine in children. Special care may be needed. Overdosage: If you think you have taken too much of this medicine contact a poison control center or emergency room at once. NOTE: This medicine is only for you. Do not share this medicine with others. What if I miss a dose? If you miss a dose, take it as soon as you can. If it is almost time for your next dose, take only that dose. Do not take double or extra doses. If you miss your dose for 3 days or more, call your doctor or health care professional to talk about how to restart this medicine. What may interact with this medicine? -insulin and other medicines for diabetes This list may not describe all possible interactions. Give your health care provider a list of all the medicines, herbs, non-prescription drugs, or dietary supplements you use. Also tell them if you smoke, drink alcohol, or use illegal drugs. Some items may interact with your medicine. What should I watch for while using this medicine? Visit your doctor or health care professional for regular checks on your progress. This medicine is intended to be used in addition to a healthy diet and appropriate exercise. The best results are achieved this way. Do not increase or in any way change your dose without consulting your doctor or health care professional. Drink plenty of fluids while taking this medicine. Check with your doctor or health care professional if you get an attack of severe diarrhea, nausea, and vomiting. The loss of too much body fluid can make it dangerous for you to take this medicine. This medicine may affect blood sugar levels. If you have diabetes, check with your doctor or health care professional before you change your diet or the dose of your diabetic medicine. Patients and their families should watch out for  worsening depression or thoughts of suicide. Also watch out for sudden changes in feelings such as feeling anxious, agitated, panicky, irritable, hostile, aggressive, impulsive, severely restless, overly excited and hyperactive, or not being able to sleep. If this happens, especially at the beginning of treatment or after a change in dose, call your health care professional. What side effects may I notice from receiving this medicine? Side effects that you should report to your doctor or health care professional as soon as possible: -allergic reactions like skin rash, itching or hives, swelling of the face, lips, or tongue -breathing problems -diarrhea that continues or is severe -lump or swelling on the neck -severe nausea -signs and symptoms of infection like fever or chills; cough; sore throat; pain or trouble passing urine -signs and symptoms of low blood sugar such as feeling anxious, confusion, dizziness, increased hunger, unusually weak or tired, sweating, shakiness, cold, irritable, headache, blurred vision, fast heartbeat, loss of consciousness -signs and symptoms of kidney injury like trouble passing urine or change in the amount of urine -trouble swallowing -unusual stomach upset or pain -vomiting Side effects that usually do not require medical attention (report to your doctor or health care professional if they continue or are bothersome): -constipation -decreased appetite -  diarrhea -fatigue -headache -nausea -pain, redness, or irritation at site where injected -stomach upset -stuffy or runny nose This list may not describe all possible side effects. Call your doctor for medical advice about side effects. You may report side effects to FDA at 1-800-FDA-1088. Where should I keep my medicine? Keep out of the reach of children. Store unopened pen in a refrigerator between 2 and 8 degrees C (36 and 46 degrees F). Do not freeze or use if the medicine has been frozen. Protect from  light and excessive heat. After you first use the pen, it can be stored at room temperature between 15 and 30 degrees C (59 and 86 degrees F) or in a refrigerator. Throw away your used pen after 30 days or after the expiration date, whichever comes first. Do not store your pen with the needle attached. If the needle is left on, medicine may leak from the pen. NOTE: This sheet is a summary. It may not cover all possible information. If you have questions about this medicine, talk to your doctor, pharmacist, or health care provider.  2018 Elsevier/Gold Standard (2016-04-22 14:41:37) Bupropion; Naltrexone extended-release tablets What is this medicine? BUPROPION; NALTREXONE (byoo PROE pee on; nal TREX one) is a combination product used to promote and maintain weight loss in obese adults or overweight adults who also have weight related medical problems. This medicine should be used with a reduced calorie diet and increased physical activity. This medicine may be used for other purposes; ask your health care provider or pharmacist if you have questions. COMMON BRAND NAME(S): CONTRAVE What should I tell my health care provider before I take this medicine? They need to know if you have any of these conditions: -an eating disorder, such as anorexia or bulimia -bipolar disorder -diabetes -depression -drug abuse or addiction -glaucoma -head injury -heart disease -high blood pressure -history of a tumor or infection of your brain or spine -history of stroke -history of irregular heartbeat -if you often drink alcohol -kidney disease -liver disease -schizophrenia -seizures -suicidal thoughts, plans, or attempt; a previous suicide attempt by you or a family member -an unusual or allergic reaction to bupropion, naltrexone, other medicines, foods, dyes, or preservatives -breast-feeding -pregnant or trying to become pregnant How should I use this medicine? Take this medicine by mouth with a glass  of water. Follow the directions on the prescription label. Take this medicine in the morning and in the evenings as directed by your healthcare professional. Bonita Quin can take it with or without food. Do not take with high-fat meals as this may increase your risk of seizures. Do not crush, chew, or cut these tablets. Do not take your medicine more often than directed. Do not stop taking this medicine suddenly except upon the advice of your doctor. A special MedGuide will be given to you by the pharmacist with each prescription and refill. Be sure to read this information carefully each time. Talk to your pediatrician regarding the use of this medicine in children. Special care may be needed. Overdosage: If you think you have taken too much of this medicine contact a poison control center or emergency room at once. NOTE: This medicine is only for you. Do not share this medicine with others. What if I miss a dose? If you miss a dose, skip the missed dose and take your next tablet at the regular time. Do not take double or extra doses. What may interact with this medicine? Do not take this medicine with any of  the following medications: -any prescription or street opioid drug like codeine, heroin, methadone -linezolid -MAOIs like Carbex, Eldepryl, Marplan, Nardil, and Parnate -methylene blue (injected into a vein) -other medicines that contain bupropion like Zyban or Wellbutrin This medicine may also interact with the following medications: -alcohol -certain medicines for anxiety or sleep -certain medicines for blood pressure like metoprolol, propranolol -certain medicines for depression or psychotic disturbances -certain medicines for HIV or AIDS like efavirenz, lopinavir, nelfinavir, ritonavir -certain medicines for irregular heart beat like propafenone, flecainide -certain medicines for Parkinson's disease like amantadine, levodopa -certain medicines for seizures like carbamazepine, phenytoin,  phenobarbital -cimetidine -clopidogrel -cyclophosphamide -digoxin -disulfiram -furazolidone -isoniazid -nicotine -orphenadrine -procarbazine -steroid medicines like prednisone or cortisone -stimulant medicines for attention disorders, weight loss, or to stay awake -tamoxifen -theophylline -thioridazine -thiotepa -ticlopidine -tramadol -warfarin This list may not describe all possible interactions. Give your health care provider a list of all the medicines, herbs, non-prescription drugs, or dietary supplements you use. Also tell them if you smoke, drink alcohol, or use illegal drugs. Some items may interact with your medicine. What should I watch for while using this medicine? This medicine is intended to be used in addition to a healthy diet and appropriate exercise. The best results are achieved this way. Do not increase or in any way change your dose without consulting your doctor or health care professional. Do not take this medicine with other prescription or over-the-counter weight loss products without consulting your doctor or health care professional. Your doctor should tell you to stop taking this medicine if you do not lose a certain amount of weight within the first 12 weeks of treatment. Visit your doctor or health care professional for regular checkups. Your doctor may order blood tests or other tests to see how you are doing. This medicine may affect blood sugar levels. If you have diabetes, check with your doctor or health care professional before you change your diet or the dose of your diabetic medicine. Patients and their families should watch out for new or worsening depression or thoughts of suicide. Also watch out for sudden changes in feelings such as feeling anxious, agitated, panicky, irritable, hostile, aggressive, impulsive, severely restless, overly excited and hyperactive, or not being able to sleep. If this happens, especially at the beginning of treatment or  after a change in dose, call your health care professional. Avoid alcoholic drinks while taking this medicine. Drinking large amounts of alcoholic beverages, using sleeping or anxiety medicines, or quickly stopping the use of these agents while taking this medicine may increase your risk for a seizure. What side effects may I notice from receiving this medicine? Side effects that you should report to your doctor or health care professional as soon as possible: -allergic reactions like skin rash, itching or hives, swelling of the face, lips, or tongue -breathing problems -changes in vision -confusion -elevated mood, decreased need for sleep, racing thoughts, impulsive behavior -fast or irregular heartbeat -hallucinations, loss of contact with reality -increased blood pressure -redness, blistering, peeling or loosening of the skin, including inside the mouth -seizures -signs and symptoms of liver injury like dark yellow or brown urine; general ill feeling or flu-like symptoms; light-colored stools; loss of appetite; nausea; right upper belly pain; unusually weak or tired; yellowing of the eyes or skin -suicidal thoughts or other mood changes -vomiting Side effects that usually do not require medical attention (report to your doctor or health care professional if they continue or are bothersome): -constipation -headache -loss of appetite -  indigestion, stomach upset -tremors This list may not describe all possible side effects. Call your doctor for medical advice about side effects. You may report side effects to FDA at 1-800-FDA-1088. Where should I keep my medicine? Keep out of the reach of children. Store at room temperature between 15 and 30 degrees C (59 and 86 degrees F). Throw away any unused medicine after the expiration date. NOTE: This sheet is a summary. It may not cover all possible information. If you have questions about this medicine, talk to your doctor, pharmacist, or health  care provider.  2018 Elsevier/Gold Standard (2015-09-26 13:42:58)

## 2018-02-08 NOTE — Assessment & Plan Note (Signed)
Discussed options. Will look them over and consider. Call with any concerns.

## 2018-02-08 NOTE — Assessment & Plan Note (Signed)
Doing well. In remission. Call with any concerns.

## 2018-02-09 ENCOUNTER — Other Ambulatory Visit: Payer: 59

## 2018-02-09 DIAGNOSIS — E782 Mixed hyperlipidemia: Secondary | ICD-10-CM

## 2018-02-09 DIAGNOSIS — R945 Abnormal results of liver function studies: Secondary | ICD-10-CM

## 2018-02-09 DIAGNOSIS — F325 Major depressive disorder, single episode, in full remission: Secondary | ICD-10-CM

## 2018-02-09 DIAGNOSIS — G43109 Migraine with aura, not intractable, without status migrainosus: Secondary | ICD-10-CM | POA: Diagnosis not present

## 2018-02-09 DIAGNOSIS — E039 Hypothyroidism, unspecified: Secondary | ICD-10-CM | POA: Diagnosis not present

## 2018-02-09 DIAGNOSIS — R7989 Other specified abnormal findings of blood chemistry: Secondary | ICD-10-CM

## 2018-02-10 LAB — CBC WITH DIFFERENTIAL/PLATELET
Basophils Absolute: 0.1 10*3/uL (ref 0.0–0.2)
Basos: 2 %
EOS (ABSOLUTE): 0.3 10*3/uL (ref 0.0–0.4)
EOS: 5 %
HEMATOCRIT: 40 % (ref 34.0–46.6)
Hemoglobin: 12.9 g/dL (ref 11.1–15.9)
IMMATURE GRANS (ABS): 0 10*3/uL (ref 0.0–0.1)
IMMATURE GRANULOCYTES: 1 %
LYMPHS: 29 %
Lymphocytes Absolute: 1.8 10*3/uL (ref 0.7–3.1)
MCH: 27.3 pg (ref 26.6–33.0)
MCHC: 32.3 g/dL (ref 31.5–35.7)
MCV: 85 fL (ref 79–97)
Monocytes Absolute: 0.5 10*3/uL (ref 0.1–0.9)
Monocytes: 9 %
NEUTROS PCT: 54 %
Neutrophils Absolute: 3.4 10*3/uL (ref 1.4–7.0)
Platelets: 301 10*3/uL (ref 150–450)
RBC: 4.73 x10E6/uL (ref 3.77–5.28)
RDW: 13 % (ref 12.3–15.4)
WBC: 6.1 10*3/uL (ref 3.4–10.8)

## 2018-02-10 LAB — COMPREHENSIVE METABOLIC PANEL
ALT: 117 IU/L — AB (ref 0–32)
AST: 63 IU/L — AB (ref 0–40)
Albumin/Globulin Ratio: 1.5 (ref 1.2–2.2)
Albumin: 4.2 g/dL (ref 3.5–5.5)
Alkaline Phosphatase: 59 IU/L (ref 39–117)
BUN/Creatinine Ratio: 16 (ref 9–23)
BUN: 9 mg/dL (ref 6–20)
Bilirubin Total: 0.6 mg/dL (ref 0.0–1.2)
CALCIUM: 9.1 mg/dL (ref 8.7–10.2)
CO2: 23 mmol/L (ref 20–29)
CREATININE: 0.56 mg/dL — AB (ref 0.57–1.00)
Chloride: 102 mmol/L (ref 96–106)
GFR calc Af Amer: 144 mL/min/{1.73_m2} (ref >59)
GFR calc non Af Amer: 125 mL/min/{1.73_m2} (ref >59)
Globulin, Total: 2.8 g/dL (ref 1.5–4.5)
Glucose: 99 mg/dL (ref 65–99)
Potassium: 4.7 mmol/L (ref 3.5–5.2)
Sodium: 142 mmol/L (ref 134–144)
Total Protein: 7 g/dL (ref 6.0–8.5)

## 2018-02-10 LAB — TSH: TSH: 3.12 u[IU]/mL (ref 0.450–4.500)

## 2018-02-10 LAB — LIPID PANEL W/O CHOL/HDL RATIO
CHOLESTEROL TOTAL: 258 mg/dL — AB (ref 100–199)
HDL: 25 mg/dL — AB (ref >39)
LDL Calculated: 174 mg/dL — ABNORMAL HIGH (ref 0–99)
TRIGLYCERIDES: 295 mg/dL — AB (ref 0–149)
VLDL Cholesterol Cal: 59 mg/dL — ABNORMAL HIGH (ref 5–40)

## 2018-02-13 ENCOUNTER — Ambulatory Visit: Payer: Self-pay

## 2018-02-13 ENCOUNTER — Encounter: Payer: Self-pay | Admitting: Family Medicine

## 2018-02-13 ENCOUNTER — Ambulatory Visit (INDEPENDENT_AMBULATORY_CARE_PROVIDER_SITE_OTHER): Payer: 59 | Admitting: Family Medicine

## 2018-02-13 VITALS — BP 148/90 | HR 76 | Temp 98.7°F | Wt 253.5 lb

## 2018-02-13 DIAGNOSIS — R739 Hyperglycemia, unspecified: Secondary | ICD-10-CM | POA: Diagnosis not present

## 2018-02-13 DIAGNOSIS — R42 Dizziness and giddiness: Secondary | ICD-10-CM

## 2018-02-13 LAB — UA/M W/RFLX CULTURE, ROUTINE
Bilirubin, UA: NEGATIVE
GLUCOSE, UA: NEGATIVE
KETONES UA: NEGATIVE
Leukocytes, UA: NEGATIVE
Nitrite, UA: NEGATIVE
SPEC GRAV UA: 1.02 (ref 1.005–1.030)
Urobilinogen, Ur: 0.2 mg/dL (ref 0.2–1.0)
pH, UA: 5.5 (ref 5.0–7.5)

## 2018-02-13 LAB — MICROSCOPIC EXAMINATION

## 2018-02-13 LAB — BAYER DCA HB A1C WAIVED: HB A1C: 5.3 % (ref <7.0)

## 2018-02-13 NOTE — Progress Notes (Signed)
BP (!) 148/90 (BP Location: Right Arm, Patient Position: Sitting, Cuff Size: Large)   Pulse 76   Temp 98.7 F (37.1 C)   Wt 253 lb 8 oz (115 kg)   SpO2 97%   BMI 44.91 kg/m    Subjective:    Patient ID: Cynthia George, female    DOB: [birth year], 32 y.o.   MRN: [id number]  HPI: Cynthia George is a 32 y.o. female  Chief Complaint  Patient presents with  . Hyperglycemia    over 200 2 hours after eating, close to 200 right before bed and 156 fasting this am. Nausea, dizziness and headaches.    Dizziness, HAs, nausea, for several weeks. Checked BS yesterday and sugars were over 200 about 2 hours after eating. Still around 180 before bed and 150s this morning fasting. Failed her 1 hour glucose test during pregnancy but never failed the 3 hour test. No hx of abnormal blood sugars or A1C.   Relevant past medical, surgical, family and social history reviewed and updated as indicated. Interim medical history since our last visit reviewed. Allergies and medications reviewed and updated.  Review of Systems  Per HPI unless specifically indicated above     Objective:    BP (!) 148/90 (BP Location: Right Arm, Patient Position: Sitting, Cuff Size: Large)   Pulse 76   Temp 98.7 F (37.1 C)   Wt 253 lb 8 oz (115 kg)   SpO2 97%   BMI 44.91 kg/m   Wt Readings from Last 3 Encounters:  02/13/18 253 lb 8 oz (115 kg)  02/08/18 256 lb 8 oz (116.3 kg)  09/30/17 251 lb 8 oz (114.1 kg)    Physical Exam  Constitutional: She is oriented to person, place, and time. She appears well-developed and well-nourished. No distress.  HENT:  Head: Atraumatic.  Eyes: Pupils are equal, round, and reactive to light. Conjunctivae and EOM are normal.  Neck: Normal range of motion. Neck supple.  Cardiovascular: Normal rate, regular rhythm and normal heart sounds.  Pulmonary/Chest: Effort normal and breath sounds normal. No respiratory distress. She has no wheezes.  Abdominal: Soft. Bowel sounds are normal.  There is no tenderness.  Musculoskeletal: Normal range of motion.  Neurological: She is alert and oriented to person, place, and time.  Skin: Skin is warm and dry.  Psychiatric: Judgment and thought content normal.  Nursing note and vitals reviewed.   Results for orders placed or performed in visit on 02/13/18  Microscopic Examination  Result Value Ref Range   WBC, UA 0-5 0 - 5 /hpf   RBC, UA 3-10 (A) 0 - 2 /hpf   Epithelial Cells (non renal) 0-10 0 - 10 /hpf   Bacteria, UA Few None seen/Few  Bayer DCA Hb A1c Waived  Result Value Ref Range   HB A1C (BAYER DCA - WAIVED) 5.3 <7.0 %  UA/M w/rflx Culture, Routine  Result Value Ref Range   Specific Gravity, UA 1.020 1.005 - 1.030   pH, UA 5.5 5.0 - 7.5   Color, UA Orange Yellow   Appearance Ur Cloudy (A) Clear   Leukocytes, UA Negative Negative   Protein, UA 1+ (A) Negative/Trace   Glucose, UA Negative Negative   Ketones, UA Negative Negative   RBC, UA 3+ (A) Negative   Bilirubin, UA Negative Negative   Urobilinogen, Ur 0.2 0.2 - 1.0 mg/dL   Nitrite, UA Negative Negative   Microscopic Examination See below:       Assessment & Plan:  Problem List Items Addressed This Visit      Other   Morbid obesity (HCC)    Long discussion about lifestyle modifications for weight loss. Pt requesting to start belviq as recommended by PCP. WIll start the medication and have her f/u in 1 month. Risks and benefits reviewed      Relevant Medications   Lorcaserin HCl (BELVIQ) 10 MG TABS    Other Visit Diagnoses    Elevated blood sugar    -  Primary   A1C 5.3 today. Work on diet, exercise. Call if BS not coming down.    Relevant Orders   Bayer DCA Hb A1c Waived (Completed)   UA/M w/rflx Culture, Routine (Completed)   Dizziness       EKG and labs benign. Will continue to monitor closely for resolution   Relevant Orders   EKG 12-Lead (Completed)       Follow up plan: Return in about 4 weeks (around 03/13/2018) for weight f/u with Dr.  Laural Benes.

## 2018-02-13 NOTE — Telephone Encounter (Signed)
Incoming call from patient with complain of obtaining high blood glucose  Value of 300 yesterday.This am fasting blood sugar was 159 at 7:32 am.  Patient dosent normally check blood sugar every am.  Does not check ketones either.  Has not been diagnosed with diabetes.  Patient states she has been experiencing dizziness, headaches,  nausea and palpitations. Also states she is thirsty a lot. State she called in today related to being tired.  Reports frequent urination and weight gain.  Denies a chance of being pregnant. Patient states she " did not sleep good at all last night." Reviewed care advice and protocol with Patient. She voiced understanding. Provided appointment for today 02/13/18 @ 1:30pm, with Roosvelt Maser, PA-C.  Patient voices understanding.   Reason for Disposition . [1] Symptoms of high blood sugar (e.g., frequent urination, weak, weight loss) AND [2] not able to test blood glucose  Answer Assessment - Initial Assessment Questions 1. BLOOD GLUCOSE: "What is your blood glucose level?"      732am 159 2. ONSET: "When did you check the blood glucose?"     732 am  3. USUAL RANGE: "What is your glucose level usually?" (e.g., usual fasting morning value, usual evening value)     no 4. KETONES: "Do you check for ketones (urine or blood test strips)?" If yes, ask: "What does the test show now?"      Not since August 5. TYPE 1 or 2:  "Do you know what type of diabetes you have?"  (e.g., Type 1, Type 2, Gestational; doesn't know)      no 6. INSULIN: "Do you take insulin?" "What type of insulin(s) do you use? What is the mode of delivery? (syringe, pen (e.g., injection or  pump)?"      no 7. DIABETES PILLS: "Do you take any pills for your diabetes?" If yes, ask: "Have you missed taking any pills recently?"     no 8. OTHER SYMPTOMS: "Do you have any symptoms?" (e.g., fever, frequent urination, difficulty breathing, dizziness, weakness, vomiting)     frquent urination increased wt, thirsty 9.  PREGNANCY: "Is there any chance you are pregnant?" "When was your last menstrual period?"      Not at all  Protocols used: DIABETES - HIGH BLOOD SUGAR-A-AH

## 2018-02-14 MED ORDER — LORCASERIN HCL 10 MG PO TABS: 10.0000 mg | ORAL_TABLET | Freq: Two times a day (BID) | ORAL | 0 refills | Status: DC

## 2018-02-15 NOTE — Assessment & Plan Note (Signed)
Long discussion about lifestyle modifications for weight loss. Pt requesting to start belviq as recommended by PCP. WIll start the medication and have her f/u in 1 month. Risks and benefits reviewed

## 2018-02-15 NOTE — Patient Instructions (Signed)
Follow up in 1 month   

## 2018-02-16 ENCOUNTER — Telehealth: Payer: Self-pay | Admitting: Family Medicine

## 2018-02-16 MED ORDER — LORCASERIN HCL 10 MG PO TABS: 10.0000 mg | ORAL_TABLET | Freq: Two times a day (BID) | ORAL | 0 refills | Status: DC

## 2018-02-16 NOTE — Telephone Encounter (Signed)
Copied from CRM [id number]. Topic: General - Other >> Feb 16, 2018  9:45 AM Leafy Ro wrote: Reason for CRM:pt saw rachel lane on 10/28 and she is still waiting for belviq 10 mg twice a day to be sent to  new [employer] employee pharm

## 2018-02-16 NOTE — Telephone Encounter (Signed)
Message relayed to patient. Verbalized understanding and denied questions.   

## 2018-02-16 NOTE — Telephone Encounter (Signed)
We had [facility] on file so that is where it was sent. I just re-sent to [facility]

## 2018-02-17 ENCOUNTER — Telehealth: Payer: Self-pay

## 2018-02-17 ENCOUNTER — Encounter: Payer: Self-pay | Admitting: Family Medicine

## 2018-02-17 NOTE — Telephone Encounter (Signed)
PA initiated for Belviq  Key: [id number]

## 2018-03-24 ENCOUNTER — Ambulatory Visit (INDEPENDENT_AMBULATORY_CARE_PROVIDER_SITE_OTHER): Payer: 59 | Admitting: Family Medicine

## 2018-03-24 ENCOUNTER — Encounter: Payer: Self-pay | Admitting: Family Medicine

## 2018-03-24 ENCOUNTER — Other Ambulatory Visit: Payer: Self-pay

## 2018-03-24 MED ORDER — LORCASERIN HCL 10 MG PO TABS: 10.0000 mg | ORAL_TABLET | Freq: Two times a day (BID) | ORAL | 5 refills | Status: DC

## 2018-03-24 NOTE — Assessment & Plan Note (Signed)
Tolerating the belviq well. Has not lost any weight yet, but thanksgiving was in there. Will continue belviq. Call with any concerns. Discussed the importance of exercise. Has stopped snacking. Recheck 2 months.

## 2018-03-24 NOTE — Progress Notes (Signed)
BP 122/78   Pulse 67   Temp 98.8 F (37.1 C) (Oral)   Ht 5\' 3"  (1.6 m)   Wt 256 lb (116.1 kg)   SpO2 97%   BMI 45.35 kg/m    Subjective:    Patient ID: Cynthia George, female    DOB: 1985/10/14, 32 y.o.   MRN: [id number]  HPI: Cynthia George is a 32 y.o. female  Chief Complaint  Patient presents with  . Weight Check    f/u   WEIGHT GAIN- here today for follow up on tolerance of her belviq- started it 1 month ago.  Duration: chronic Previous attempts at weight loss: diet and exercise, weight watchers Complications of obesity: HLD, elevated liver enzymes Peak weight: 256 Weight loss goal: 30lbs Weight loss to date: 0lbs Requesting obesity pharmacotherapy: yes Current weight loss supplements/medications: yes Previous weight loss supplements/meds: no  Relevant past medical, surgical, family and social history reviewed and updated as indicated. Interim medical history since our last visit reviewed. Allergies and medications reviewed and updated.  Review of Systems  Constitutional: Negative.   Respiratory: Negative.   Cardiovascular: Negative.   Gastrointestinal: Negative.   Psychiatric/Behavioral: Negative.     Per HPI unless specifically indicated above     Objective:    BP 122/78   Pulse 67   Temp 98.8 F (37.1 C) (Oral)   Ht 5\' 3"  (1.6 m)   Wt 256 lb (116.1 kg)   SpO2 97%   BMI 45.35 kg/m   Wt Readings from Last 3 Encounters:  03/24/18 256 lb (116.1 kg)  02/13/18 253 lb 8 oz (115 kg)  02/08/18 256 lb 8 oz (116.3 kg)    Physical Exam  Constitutional: She is oriented to person, place, and time. She appears well-developed and well-nourished. No distress.  HENT:  Head: Normocephalic and atraumatic.  Right Ear: Hearing normal.  Left Ear: Hearing normal.  Nose: Nose normal.  Eyes: Conjunctivae and lids are normal. Right eye exhibits no discharge. Left eye exhibits no discharge. No scleral icterus.  Cardiovascular: Normal rate, regular rhythm, normal  heart sounds and intact distal pulses. Exam reveals no gallop and no friction rub.  No murmur heard. Pulmonary/Chest: Effort normal and breath sounds normal. No stridor. No respiratory distress. She has no wheezes. She has no rales. She exhibits no tenderness.  Musculoskeletal: Normal range of motion.  Neurological: She is alert and oriented to person, place, and time.  Skin: Skin is warm, dry and intact. Capillary refill takes less than 2 seconds. No rash noted. She is not diaphoretic. No erythema. No pallor.  Psychiatric: She has a normal mood and affect. Her speech is normal and behavior is normal. Judgment and thought content normal. Cognition and memory are normal.  Nursing note and vitals reviewed.   Results for orders placed or performed in visit on 02/13/18  Microscopic Examination  Result Value Ref Range   WBC, UA 0-5 0 - 5 /hpf   RBC, UA 3-10 (A) 0 - 2 /hpf   Epithelial Cells (non renal) 0-10 0 - 10 /hpf   Bacteria, UA Few None seen/Few  Bayer DCA Hb A1c Waived  Result Value Ref Range   HB A1C (BAYER DCA - WAIVED) 5.3 <7.0 %  UA/M w/rflx Culture, Routine  Result Value Ref Range   Specific Gravity, UA 1.020 1.005 - 1.030   pH, UA 5.5 5.0 - 7.5   Color, UA Orange Yellow   Appearance Ur Cloudy (A) Clear   Leukocytes,  UA Negative Negative   Protein, UA 1+ (A) Negative/Trace   Glucose, UA Negative Negative   Ketones, UA Negative Negative   RBC, UA 3+ (A) Negative   Bilirubin, UA Negative Negative   Urobilinogen, Ur 0.2 0.2 - 1.0 mg/dL   Nitrite, UA Negative Negative   Microscopic Examination See below:       Assessment & Plan:   Problem List Items Addressed This Visit      Other   Morbid obesity (HCC) - Primary    Tolerating the belviq well. Has not lost any weight yet, but thanksgiving was in there. Will continue belviq. Call with any concerns. Discussed the importance of exercise. Has stopped snacking. Recheck 2 months.       Relevant Medications   Lorcaserin HCl  (BELVIQ) 10 MG TABS       Follow up plan: Return After 06/13/17, for Physical and follow up weight.

## 2018-06-16 ENCOUNTER — Ambulatory Visit (INDEPENDENT_AMBULATORY_CARE_PROVIDER_SITE_OTHER): Payer: No Typology Code available for payment source | Admitting: Family Medicine

## 2018-06-16 ENCOUNTER — Other Ambulatory Visit: Payer: Self-pay

## 2018-06-16 ENCOUNTER — Encounter: Payer: Self-pay | Admitting: Family Medicine

## 2018-06-16 VITALS — BP 118/79 | HR 79 | Temp 98.4°F | Ht 63.5 in | Wt 249.0 lb

## 2018-06-16 DIAGNOSIS — Z0000 Encounter for general adult medical examination without abnormal findings: Secondary | ICD-10-CM | POA: Diagnosis not present

## 2018-06-16 LAB — UA/M W/RFLX CULTURE, ROUTINE
BILIRUBIN UA: NEGATIVE
Glucose, UA: NEGATIVE
Ketones, UA: NEGATIVE
Leukocytes, UA: NEGATIVE
Nitrite, UA: NEGATIVE
Protein, UA: NEGATIVE
RBC, UA: NEGATIVE
Specific Gravity, UA: 1.02 (ref 1.005–1.030)
Urobilinogen, Ur: 0.2 mg/dL (ref 0.2–1.0)
pH, UA: 5.5 (ref 5.0–7.5)

## 2018-06-16 NOTE — Progress Notes (Signed)
BP 118/79   Pulse 79   Temp 98.4 F (36.9 C) (Oral)   Ht 5' 3.5" (1.613 m)   Wt 249 lb (112.9 kg)   SpO2 98%   BMI 43.42 kg/m    Subjective:    Patient ID: Cynthia George, female    DOB: 23-Jul-1985, 33 y.o.   MRN: [id number]  HPI: Cynthia George is a 33 y.o. female presenting on 06/16/2018 for comprehensive medical examination. Current medical complaints include: Going back to GYN to try for Baby #2  She currently lives with: husband and son Menopausal Symptoms: no  Depression Screen done today and results listed below:  Depression screen [facility] 2/9 06/16/2018 02/08/2018 06/13/2017 05/27/2016 05/26/2015  Decreased Interest 0 1 0 0 1  Down, Depressed, Hopeless 0 1 0 0 1  PHQ - 2 Score 0 2 0 0 2  Altered sleeping 1 3 3 1  -  Tired, decreased energy 1 2 3 1  -  Change in appetite 0 0 2 0 -  Feeling bad or failure about yourself  0 0 0 0 -  Trouble concentrating 0 0 0 0 -  Moving slowly or fidgety/restless 0 0 0 0 -  Suicidal thoughts 0 0 0 0 -  PHQ-9 Score 2 7 8 2  -  Difficult doing work/chores Not difficult at all Not difficult at all Not difficult at all - -     Past Medical History:  Past Medical History:  Diagnosis Date  . Anxiety   . Depression   . Hyperlipidemia   . Migraines   . Obesity   . PCOS (polycystic ovarian syndrome)     Surgical History:  Past Surgical History:  Procedure Laterality Date  . APPENDECTOMY  2013  . HUMERUS FRACTURE SURGERY Left 2002,2003   rod and screws placed and removed  . TONSILLECTOMY AND ADENOIDECTOMY  2004  . WISDOM TOOTH EXTRACTION  2011   All    Medications:  Current Outpatient Medications on File Prior to Visit  Medication Sig  . levothyroxine (SYNTHROID, LEVOTHROID) 75 MCG tablet Take 1 tablet (75 mcg total) by mouth daily before breakfast.   No current facility-administered medications on file prior to visit.     Allergies:  Allergies  Allergen Reactions  . Flagyl [Metronidazole] Rash  . Keflex [Cephalexin] Rash  .  Septra [Sulfamethoxazole-Trimethoprim] Rash    Social History:  Social History   Socioeconomic History  . Marital status: Married    Spouse name: Not on file  . Number of children: Not on file  . Years of education: Not on file  . Highest education level: Not on file  Occupational History  . Not on file  Social Needs  . Financial resource strain: Not on file  . Food insecurity:    Worry: Not on file    Inability: Not on file  . Transportation needs:    Medical: Not on file    Non-medical: Not on file  Tobacco Use  . Smoking status: Never Smoker  . Smokeless tobacco: Never Used  Substance and Sexual Activity  . Alcohol use: No  . Drug use: No  . Sexual activity: Not Currently    Birth control/protection: None  Lifestyle  . Physical activity:    Days per week: Not on file    Minutes per session: Not on file  . Stress: Not on file  Relationships  . Social connections:    Talks on phone: Not on file    Gets together: Not  on file    Attends religious service: Not on file    Active member of club or organization: Not on file    Attends meetings of clubs or organizations: Not on file    Relationship status: Not on file  . Intimate partner violence:    Fear of current or ex partner: Not on file    Emotionally abused: Not on file    Physically abused: Not on file    Forced sexual activity: Not on file  Other Topics Concern  . Not on file  Social History Narrative  . Not on file   Social History   Tobacco Use  Smoking Status Never Smoker  Smokeless Tobacco Never Used   Social History   Substance and Sexual Activity  Alcohol Use No    Family History:  Family History  Problem Relation Age of Onset  . Heart disease Mother   . Hypertension Mother   . Hyperlipidemia Mother   . Heart attack Mother   . Hyperlipidemia Father   . Hypertension Father   . Diabetes Father   . Asthma Father   . Heart attack Father 14  . Hyperlipidemia Sister   . Asthma Sister     . Polycystic ovary syndrome Sister   . Mental illness Sister        Depression  . Mental illness Brother        Depression  . Diabetes Maternal Grandmother   . Hyperlipidemia Maternal Grandmother   . Hypertension Maternal Grandmother   . Diabetes Maternal Grandfather   . Hypertension Maternal Grandfather   . Heart attack Maternal Grandfather   . Diabetes Paternal Grandmother   . Heart attack Paternal Grandfather     Past medical history, surgical history, medications, allergies, family history and social history reviewed with patient today and changes made to appropriate areas of the chart.   Review of Systems  Constitutional: Negative.   HENT: Negative.   Eyes: Negative.   Respiratory: Negative.   Cardiovascular: Negative.   Gastrointestinal: Negative.   Genitourinary: Negative.   Musculoskeletal: Negative.   Skin: Negative.   Neurological: Negative.   Endo/Heme/Allergies: Negative.   Psychiatric/Behavioral: Negative.     All other ROS negative except what is listed above and in the HPI.      Objective:    BP 118/79   Pulse 79   Temp 98.4 F (36.9 C) (Oral)   Ht 5' 3.5" (1.613 m)   Wt 249 lb (112.9 kg)   SpO2 98%   BMI 43.42 kg/m   Wt Readings from Last 3 Encounters:  06/16/18 249 lb (112.9 kg)  03/24/18 256 lb (116.1 kg)  02/13/18 253 lb 8 oz (115 kg)    Physical Exam Vitals signs and nursing note reviewed.  Constitutional:      General: She is not in acute distress.    Appearance: Normal appearance. She is not ill-appearing, toxic-appearing or diaphoretic.  HENT:     Head: Normocephalic and atraumatic.     Right Ear: Tympanic membrane, ear canal and external ear normal. There is no impacted cerumen.     Left Ear: Tympanic membrane, ear canal and external ear normal. There is no impacted cerumen.     Nose: Nose normal. No congestion or rhinorrhea.     Mouth/Throat:     Mouth: Mucous membranes are moist.     Pharynx: Oropharynx is clear. No  oropharyngeal exudate or posterior oropharyngeal erythema.  Eyes:     General: No scleral icterus.  Right eye: No discharge.        Left eye: No discharge.     Extraocular Movements: Extraocular movements intact.     Conjunctiva/sclera: Conjunctivae normal.     Pupils: Pupils are equal, round, and reactive to light.  Neck:     Musculoskeletal: Normal range of motion and neck supple. No neck rigidity or muscular tenderness.     Vascular: No carotid bruit.  Cardiovascular:     Rate and Rhythm: Normal rate and regular rhythm.     Pulses: Normal pulses.     Heart sounds: No murmur. No friction rub. No gallop.   Pulmonary:     Effort: Pulmonary effort is normal. No respiratory distress.     Breath sounds: Normal breath sounds. No stridor. No wheezing, rhonchi or rales.  Chest:     Chest wall: No tenderness.  Abdominal:     General: Abdomen is flat. Bowel sounds are normal. There is no distension.     Palpations: Abdomen is soft. There is no mass.     Tenderness: There is no abdominal tenderness. There is no right CVA tenderness, left CVA tenderness, guarding or rebound.     Hernia: No hernia is present.  Genitourinary:    Comments: Breast and pelvic exams deferred with shared decision making Musculoskeletal:        General: No swelling, tenderness, deformity or signs of injury.     Right lower leg: No edema.     Left lower leg: No edema.  Lymphadenopathy:     Cervical: No cervical adenopathy.  Skin:    General: Skin is warm and dry.     Capillary Refill: Capillary refill takes less than 2 seconds.     Coloration: Skin is not jaundiced or pale.     Findings: No bruising, erythema, lesion or rash.  Neurological:     General: No focal deficit present.     Mental Status: She is alert and oriented to person, place, and time. Mental status is at baseline.     Cranial Nerves: No cranial nerve deficit.     Sensory: No sensory deficit.     Motor: No weakness.     Coordination:  Coordination normal.     Gait: Gait normal.     Deep Tendon Reflexes: Reflexes normal.  Psychiatric:        Mood and Affect: Mood normal.        Behavior: Behavior normal.        Thought Content: Thought content normal.        Judgment: Judgment normal.     Results for orders placed or performed in visit on 06/16/18  CBC with Differential/Platelet  Result Value Ref Range   WBC 6.7 3.4 - 10.8 x10E3/uL   RBC 4.63 3.77 - 5.28 x10E6/uL   Hemoglobin 13.1 11.1 - 15.9 g/dL   Hematocrit 96.0 45.4 - 46.6 %   MCV 85 79 - 97 fL   MCH 28.3 26.6 - 33.0 pg   MCHC 33.4 31.5 - 35.7 g/dL   RDW 09.8 11.9 - 14.7 %   Platelets 294 150 - 450 x10E3/uL   Neutrophils 58 Not Estab. %   Lymphs 27 Not Estab. %   Monocytes 9 Not Estab. %   Eos 4 Not Estab. %   Basos 2 Not Estab. %   Neutrophils Absolute 3.9 1.4 - 7.0 x10E3/uL   Lymphocytes Absolute 1.8 0.7 - 3.1 x10E3/uL   Monocytes Absolute 0.6 0.1 - 0.9 x10E3/uL  EOS (ABSOLUTE) 0.3 0.0 - 0.4 x10E3/uL   Basophils Absolute 0.1 0.0 - 0.2 x10E3/uL   Immature Granulocytes 0 Not Estab. %   Immature Grans (Abs) 0.0 0.0 - 0.1 x10E3/uL  Comprehensive metabolic panel  Result Value Ref Range   Glucose 95 65 - 99 mg/dL   BUN 12 6 - 20 mg/dL   Creatinine, Ser 1.53 0.57 - 1.00 mg/dL   GFR calc non Af Amer 118 >59 mL/min/1.73   GFR calc Af Amer 135 >59 mL/min/1.73   BUN/Creatinine Ratio 18 9 - 23   Sodium 139 134 - 144 mmol/L   Potassium 4.9 3.5 - 5.2 mmol/L   Chloride 102 96 - 106 mmol/L   CO2 24 20 - 29 mmol/L   Calcium 9.3 8.7 - 10.2 mg/dL   Total Protein 6.9 6.0 - 8.5 g/dL   Albumin 4.4 3.8 - 4.8 g/dL   Globulin, Total 2.5 1.5 - 4.5 g/dL   Albumin/Globulin Ratio 1.8 1.2 - 2.2   Bilirubin Total 0.6 0.0 - 1.2 mg/dL   Alkaline Phosphatase 59 39 - 117 IU/L   AST 38 0 - 40 IU/L   ALT 73 (H) 0 - 32 IU/L  Lipid Panel w/o Chol/HDL Ratio  Result Value Ref Range   Cholesterol, Total 240 (H) 100 - 199 mg/dL   Triglycerides 794 (H) 0 - 149 mg/dL   HDL 26  (L) >32 mg/dL   VLDL Cholesterol Cal 60 (H) 5 - 40 mg/dL   LDL Calculated 761 (H) 0 - 99 mg/dL  TSH  Result Value Ref Range   TSH 3.880 0.450 - 4.500 uIU/mL  UA/M w/rflx Culture, Routine  Result Value Ref Range   Specific Gravity, UA 1.020 1.005 - 1.030   pH, UA 5.5 5.0 - 7.5   Color, UA Yellow Yellow   Appearance Ur Clear Clear   Leukocytes, UA Negative Negative   Protein, UA Negative Negative/Trace   Glucose, UA Negative Negative   Ketones, UA Negative Negative   RBC, UA Negative Negative   Bilirubin, UA Negative Negative   Urobilinogen, Ur 0.2 0.2 - 1.0 mg/dL   Nitrite, UA Negative Negative      Assessment & Plan:   Problem List Items Addressed This Visit    None    Visit Diagnoses    Routine general medical examination at a health care facility    -  Primary   Vaccines up to date. Screening labs checked today. Pap through GYN. Continue diet and exercise. Call with any concerns.    Relevant Orders   CBC with Differential/Platelet (Completed)   Comprehensive metabolic panel (Completed)   Lipid Panel w/o Chol/HDL Ratio (Completed)   TSH (Completed)   UA/M w/rflx Culture, Routine (Completed)       Follow up plan: Return in about 1 year (around 06/17/2019) for Physical.   LABORATORY TESTING:  - Pap smear: done elsewhere  IMMUNIZATIONS:   - Tdap: Tetanus vaccination status reviewed: last tetanus booster within 10 years. - Influenza: Up to date - Pneumovax: Not applicable - Prevnar: Not applicable  PATIENT COUNSELING:   Advised to take 1 mg of folate supplement per day if capable of pregnancy.   Sexuality: Discussed sexually transmitted diseases, partner selection, use of condoms, avoidance of unintended pregnancy  and contraceptive alternatives.   Advised to avoid cigarette smoking.  I discussed with the patient that most people either abstain from alcohol or drink within safe limits (<=14/week and <=4 drinks/occasion for males, <=7/weeks and <= 3  drinks/occasion for females) and that the risk for alcohol disorders and other health effects rises proportionally with the number of drinks per week and how often a drinker exceeds daily limits.  Discussed cessation/primary prevention of drug use and availability of treatment for abuse.   Diet: Encouraged to adjust caloric intake to maintain  or achieve ideal body weight, to reduce intake of dietary saturated fat and total fat, to limit sodium intake by avoiding high sodium foods and not adding table salt, and to maintain adequate dietary potassium and calcium preferably from fresh fruits, vegetables, and low-fat dairy products.    stressed the importance of regular exercise  Injury prevention: Discussed safety belts, safety helmets, smoke detector, smoking near bedding or upholstery.   Dental health: Discussed importance of regular tooth brushing, flossing, and dental visits.    NEXT PREVENTATIVE PHYSICAL DUE IN 1 YEAR. Return in about 1 year (around 06/17/2019) for Physical.

## 2018-06-16 NOTE — Patient Instructions (Signed)

## 2018-06-17 LAB — CBC WITH DIFFERENTIAL/PLATELET
BASOS: 2 %
Basophils Absolute: 0.1 10*3/uL (ref 0.0–0.2)
EOS (ABSOLUTE): 0.3 10*3/uL (ref 0.0–0.4)
EOS: 4 %
HEMATOCRIT: 39.2 % (ref 34.0–46.6)
Hemoglobin: 13.1 g/dL (ref 11.1–15.9)
IMMATURE GRANULOCYTES: 0 %
Immature Grans (Abs): 0 10*3/uL (ref 0.0–0.1)
Lymphocytes Absolute: 1.8 10*3/uL (ref 0.7–3.1)
Lymphs: 27 %
MCH: 28.3 pg (ref 26.6–33.0)
MCHC: 33.4 g/dL (ref 31.5–35.7)
MCV: 85 fL (ref 79–97)
Monocytes Absolute: 0.6 10*3/uL (ref 0.1–0.9)
Monocytes: 9 %
NEUTROS ABS: 3.9 10*3/uL (ref 1.4–7.0)
NEUTROS PCT: 58 %
Platelets: 294 10*3/uL (ref 150–450)
RBC: 4.63 x10E6/uL (ref 3.77–5.28)
RDW: 13.2 % (ref 11.7–15.4)
WBC: 6.7 10*3/uL (ref 3.4–10.8)

## 2018-06-17 LAB — COMPREHENSIVE METABOLIC PANEL
A/G RATIO: 1.8 (ref 1.2–2.2)
ALT: 73 IU/L — AB (ref 0–32)
AST: 38 IU/L (ref 0–40)
Albumin: 4.4 g/dL (ref 3.8–4.8)
Alkaline Phosphatase: 59 IU/L (ref 39–117)
BUN/Creatinine Ratio: 18 (ref 9–23)
BUN: 12 mg/dL (ref 6–20)
Bilirubin Total: 0.6 mg/dL (ref 0.0–1.2)
CALCIUM: 9.3 mg/dL (ref 8.7–10.2)
CO2: 24 mmol/L (ref 20–29)
Chloride: 102 mmol/L (ref 96–106)
Creatinine, Ser: 0.67 mg/dL (ref 0.57–1.00)
GFR, EST AFRICAN AMERICAN: 135 mL/min/{1.73_m2} (ref >59)
GFR, EST NON AFRICAN AMERICAN: 118 mL/min/{1.73_m2} (ref >59)
GLOBULIN, TOTAL: 2.5 g/dL (ref 1.5–4.5)
Glucose: 95 mg/dL (ref 65–99)
POTASSIUM: 4.9 mmol/L (ref 3.5–5.2)
SODIUM: 139 mmol/L (ref 134–144)
TOTAL PROTEIN: 6.9 g/dL (ref 6.0–8.5)

## 2018-06-17 LAB — LIPID PANEL W/O CHOL/HDL RATIO
Cholesterol, Total: 240 mg/dL — ABNORMAL HIGH (ref 100–199)
HDL: 26 mg/dL — AB (ref >39)
LDL Calculated: 154 mg/dL — ABNORMAL HIGH (ref 0–99)
Triglycerides: 301 mg/dL — ABNORMAL HIGH (ref 0–149)
VLDL Cholesterol Cal: 60 mg/dL — ABNORMAL HIGH (ref 5–40)

## 2018-06-17 LAB — TSH: TSH: 3.88 u[IU]/mL (ref 0.450–4.500)

## 2018-06-18 ENCOUNTER — Encounter: Payer: Self-pay | Admitting: Family Medicine

## 2018-08-01 ENCOUNTER — Other Ambulatory Visit
Admission: RE | Admit: 2018-08-01 | Discharge: 2018-08-01 | Disposition: A | Discharge status: Home / Self Care | Payer: No Typology Code available for payment source | Source: Ambulatory Visit | Attending: Family Medicine | Admitting: Family Medicine

## 2018-08-01 ENCOUNTER — Encounter: Payer: Self-pay | Admitting: Family Medicine

## 2018-08-01 ENCOUNTER — Other Ambulatory Visit: Payer: Self-pay | Admitting: Family Medicine

## 2018-08-01 DIAGNOSIS — E039 Hypothyroidism, unspecified: Secondary | ICD-10-CM

## 2018-08-02 ENCOUNTER — Encounter: Payer: Self-pay | Admitting: Family Medicine

## 2018-08-02 ENCOUNTER — Ambulatory Visit (INDEPENDENT_AMBULATORY_CARE_PROVIDER_SITE_OTHER): Payer: No Typology Code available for payment source | Admitting: Family Medicine

## 2018-08-02 ENCOUNTER — Other Ambulatory Visit: Payer: Self-pay

## 2018-08-02 VITALS — Temp 98.1°F

## 2018-08-02 DIAGNOSIS — E039 Hypothyroidism, unspecified: Secondary | ICD-10-CM

## 2018-08-02 LAB — THYROID PANEL WITH TSH
Free Thyroxine Index: 1.7 (ref 1.2–4.9)
T3 Uptake Ratio: 22 % — ABNORMAL LOW (ref 24–39)
T4, Total: 7.5 ug/dL (ref 4.5–12.0)
TSH: 3.44 u[IU]/mL (ref 0.450–4.500)

## 2018-08-02 MED ORDER — LEVOTHYROXINE SODIUM 88 MCG PO TABS: 88.0000 ug | ORAL_TABLET | Freq: Every day | ORAL | 2 refills | Status: DC

## 2018-08-02 NOTE — Progress Notes (Signed)
Temp 98.1 F (36.7 C) (Oral)    Subjective:    Patient ID: Cynthia George, female    DOB: 1985-07-16, 33 y.o.   MRN: [id number]  HPI: Cynthia George is a 33 y.o. female  Chief Complaint  Patient presents with  . Follow-up    Fatigue, Sensitive to heat, mild muscle aches (bilateral legs) - Started x 1 week ago, worsening.   . Thyroid Problem   HYPOTHYROIDISM Thyroid control status:uncontrolled Satisfied with current treatment? no Medication side effects: no Medication compliance: excellent compliance Etiology of hypothyroidism:  Recent dose adjustment:no Fatigue: yes Cold intolerance: no Heat intolerance: yes Weight gain: no Weight loss: no Constipation: no Diarrhea/loose stools: no Palpitations: no Lower extremity edema: no Anxiety/depressed mood: no   Relevant past medical, surgical, family and social history reviewed and updated as indicated. Interim medical history since our last visit reviewed. Allergies and medications reviewed and updated.  Review of Systems  Constitutional: Positive for fatigue. Negative for activity change, appetite change, chills, diaphoresis, fever and unexpected weight change.  HENT: Negative.   Respiratory: Negative.   Cardiovascular: Negative.   Gastrointestinal: Negative.   Musculoskeletal: Positive for myalgias. Negative for arthralgias, back pain, gait problem, joint swelling, neck pain and neck stiffness.  Neurological: Negative.   Psychiatric/Behavioral: Negative.     Per HPI unless specifically indicated above     Objective:    Temp 98.1 F (36.7 C) (Oral)   Wt Readings from Last 3 Encounters:  06/16/18 249 lb (112.9 kg)  03/24/18 256 lb (116.1 kg)  02/13/18 253 lb 8 oz (115 kg)    Physical Exam Vitals signs and nursing note reviewed.  Constitutional:      General: She is not in acute distress.    Appearance: Normal appearance. She is not ill-appearing, toxic-appearing or diaphoretic.  HENT:     Head:  Normocephalic and atraumatic.     Right Ear: External ear normal.     Left Ear: External ear normal.     Nose: Nose normal.     Mouth/Throat:     Mouth: Mucous membranes are moist.     Pharynx: Oropharynx is clear.  Eyes:     General: No scleral icterus.       Right eye: No discharge.        Left eye: No discharge.     Conjunctiva/sclera: Conjunctivae normal.     Pupils: Pupils are equal, round, and reactive to light.  Neck:     Musculoskeletal: Normal range of motion.  Pulmonary:     Effort: Pulmonary effort is normal. No respiratory distress.     Comments: Speaking in full sentences Musculoskeletal: Normal range of motion.  Skin:    Coloration: Skin is not jaundiced or pale.     Findings: No bruising, erythema, lesion or rash.  Neurological:     Mental Status: She is alert and oriented to person, place, and time. Mental status is at baseline.  Psychiatric:        Mood and Affect: Mood normal.        Behavior: Behavior normal.        Thought Content: Thought content normal.        Judgment: Judgment normal.     Results for orders placed or performed during the hospital encounter of 08/01/18  Thyroid Panel With TSH  Result Value Ref Range   TSH 3.440 0.450 - 4.500 uIU/mL   T4, Total 7.5 4.5 - 12.0 ug/dL   T3 Uptake Ratio  22 (L) 24 - 39 %   Free Thyroxine Index 1.7 1.2 - 4.9      Assessment & Plan:   Problem List Items Addressed This Visit      Endocrine   Hypothyroid - Primary    TSH 3.4- but having symptoms. Will increase her dose to and recheck 6 weeks. Call with any concerns.       Relevant Medications   levothyroxine (SYNTHROID, LEVOTHROID) 88 MCG tablet    Other Visit Diagnoses    Hypothyroidism (acquired)       Relevant Medications   levothyroxine (SYNTHROID, LEVOTHROID) 88 MCG tablet   Other Relevant Orders   TSH       Follow up plan: Return if symptoms worsen or fail to improve.    . This visit was completed via Skype due to the  restrictions of the COVID-19 pandemic. All issues as above were discussed and addressed. Physical exam was done as above through visual confirmation on Skype. If it was felt that the patient should be evaluated in the office, they were directed there. The patient verbally consented to this visit. . Location of the patient: home . Location of the provider: home . Those involved with this call:  . Provider: Olevia Perches, DO . CMA: Sheilah Mins, CMA . Front Desk/Registration: Adela Ports  . Time spent on call: 15 minutes with patient face to face via video conference. More than 50% of this time was spent in counseling and coordination of care. 23 minutes total spent in review of patient's record and preparation of their chart.

## 2018-08-02 NOTE — Assessment & Plan Note (Signed)
TSH 3.4- but having symptoms. Will increase her dose to and recheck 6 weeks. Call with any concerns.

## 2018-08-17 NOTE — Telephone Encounter (Signed)
Follow up phone visit infertility 1-2 weeks

## 2018-08-18 NOTE — Telephone Encounter (Signed)
Patient is schedule 08/31/18

## 2018-08-31 ENCOUNTER — Other Ambulatory Visit: Payer: Self-pay

## 2018-08-31 ENCOUNTER — Ambulatory Visit (INDEPENDENT_AMBULATORY_CARE_PROVIDER_SITE_OTHER): Payer: No Typology Code available for payment source | Admitting: Obstetrics and Gynecology

## 2018-08-31 ENCOUNTER — Encounter: Payer: Self-pay | Admitting: Obstetrics and Gynecology

## 2018-08-31 DIAGNOSIS — E282 Polycystic ovarian syndrome: Secondary | ICD-10-CM | POA: Diagnosis not present

## 2018-08-31 MED ORDER — MEDROXYPROGESTERONE ACETATE 10 MG PO TABS: 10.0000 mg | ORAL_TABLET | Freq: Every day | ORAL | 2 refills | Status: DC

## 2018-08-31 NOTE — Progress Notes (Signed)
I connected with Cynthia George on 08/31/18 at  3:10 PM EDT by telephone and verified that I am speaking with the correct person using two identifiers.   I discussed the limitations, risks, security and privacy concerns of performing an evaluation and management service by telephone and the availability of in person appointments. I also discussed with the patient that there may be a patient responsible charge related to this service. The patient expressed understanding and agreed to proceed.  The patient was at home I spoke with the patient from my workstation phone.  The names of people involved in this encounter were: Cynthia George , and [name]   Gynecology H&P  PCP: Dorcas CarrowJohnson, Megan P, DO  Chief Complaint:  Chief Complaint  Patient presents with  . Follow-up    Discuss infertility been on Clomid in past    History of Present Illness: Patient is a 33 y.o. G1P1001 presenting for evaluation of infertility. Patient and partner have been attempting unprotected intercourse for several months and actively trying for conception for last month Pregnancies with current partner yes  Sexual History Frequency: 3 times per week(s) Dyspareunia: no Use of Lubricant: no Douching: no  Ovulatory Evaluation LMP: No LMP recorded. Interval regular approximately once monthly Duration of flow: up to 2-4 weeks days Heavy Menses: no Clots: no Intermenstrual Bleeding: no Dysmenorrhea: no  Molimina yes Ovulation Predictor Kits Positive  no  PCOS  Wt Change: no Hirsutism: no Acne: no  Hyperprolactinemia Galactorrhea: no Headaches: no Vision Changes:  no  Thyroid Temperature Intolerance: yes Constipation or Diarrhea: no Hair Thinning:  no Palpitation:  no  Prior treatments Meds: previously conceived on clomid Other Therapies: Not applicable  Premature Ovarian Failure Family history of autism, mental retardation, or fragile X: no Family history of premature ovarian  failure or early menopause: no Prior radiation or chemotherapy exposoure:  no  Tubal Factor Previous abdominal or pelvic surgery: no Pelvic Pain:  no Endometriosis: no STD: no PID: no Laparoscopy: no   Female Factor Sired prior conception:  yes   Review of Systems: 10 point review of systems negative unless otherwise noted in HPI  Past Medical History:  Past Medical History:  Diagnosis Date  . Anxiety   . Depression   . Hyperlipidemia   . Migraines   . Obesity   . PCOS (polycystic ovarian syndrome)     Past Surgical History:  Past Surgical History:  Procedure Laterality Date  . APPENDECTOMY  2013  . HUMERUS FRACTURE SURGERY Left 2002,2003   rod and screws placed and removed  . TONSILLECTOMY AND ADENOIDECTOMY  2004  . WISDOM TOOTH EXTRACTION  2011   All   Family History:  Family History  Problem Relation Age of Onset  . Heart disease Mother   . Hypertension Mother   . Hyperlipidemia Mother   . Heart attack Mother   . Hyperlipidemia Father   . Hypertension Father   . Diabetes Father   . Asthma Father   . Heart attack Father [id number]  . Hyperlipidemia Sister   . Asthma Sister   . Polycystic ovary syndrome Sister   . Mental illness Sister        Depression  . Mental illness Brother        Depression  . Diabetes Maternal Grandmother   . Hyperlipidemia Maternal Grandmother   . Hypertension Maternal Grandmother   . Diabetes Maternal Grandfather   . Hypertension Maternal Grandfather   . Heart attack Maternal Grandfather   .  Diabetes Paternal Grandmother   . Heart attack Paternal Grandfather     Social History:  Social History   Socioeconomic History  . Marital status: Married    Spouse name: Not on file  . Number of children: Not on file  . Years of education: Not on file  . Highest education level: Not on file  Occupational History  . Not on file  Social Needs  . Financial resource strain: Not on file  . Food insecurity:    Worry: Not on file     Inability: Not on file  . Transportation needs:    Medical: Not on file    Non-medical: Not on file  Tobacco Use  . Smoking status: Never Smoker  . Smokeless tobacco: Never Used  Substance and Sexual Activity  . Alcohol use: No  . Drug use: No  . Sexual activity: Yes    Birth control/protection: None  Lifestyle  . Physical activity:    Days per week: Not on file    Minutes per session: Not on file  . Stress: Not on file  Relationships  . Social connections:    Talks on phone: Not on file    Gets together: Not on file    Attends religious service: Not on file    Active member of club or organization: Not on file    Attends meetings of clubs or organizations: Not on file    Relationship status: Not on file  . Intimate partner violence:    Fear of current or ex partner: Not on file    Emotionally abused: Not on file    Physically abused: Not on file    Forced sexual activity: Not on file  Other Topics Concern  . Not on file  Social History Narrative  . Not on file    Allergies:  Allergies  Allergen Reactions  . Flagyl [Metronidazole] Rash  . Keflex [Cephalexin] Rash  . Septra [Sulfamethoxazole-Trimethoprim] Rash    Medications: Prior to Admission medications   Medication Sig Start Date End Date Taking? Authorizing Provider  levothyroxine (SYNTHROID, LEVOTHROID) 88 MCG tablet Take 1 tablet (88 mcg total) by mouth daily before breakfast. 08/02/18  Yes Dorcas Carrow, DO    Physical Exam Current 240lbs   No physical exam as this was a remote telephone visit to promote social distancing during the current COVID-19 Pandemic   @ORDERS @  Assessment: 33 y.o. G1P1001 presenting for initial infertility evaluatoin  Plan: 1) We discussed the underlying etiologies which may be implicated in a couple experiencing difficulty conceiving.  The average couple will conceive within the span of 1 year with unprotected coitus, with a monthly fecundity rate of 20% or 1 in 5.  Even  without further work up or intervention the patient and her partner may be successful in conceiving unassisted, although if an underlying etiology can be identified and addressed fecundity rate may improve.  The work up entails examining for ovulatory function, tubal patency, and ruling out female factor infertility.  These may be looked at concurrently or sequentially.  The downside of sequential work up is that this method may miss issues if more than one compartment is contributing.  She is aware that tubal factor or moderate to severe female factor infertility will require further consultation with a reproductive endocrinologist.  In the case of anovulation, use of Clomid (clomiphen citrate) or Femara (letrazole) were discussed with the understanding the the later is an off-label, but well supported use.  With either of  these drugs the risk of multiples increases from the standard population rate of 2% to approximately 10%, with higher order multiples possible but unlikely.  Both drugs may require some time to titrate to the appropriate dosage to ensure consistent ovulation.  Cycles will be limited to 6 cycles on each drug secondary to decreasing rates of conception after 6 cycles.  In addition should patient be started on ovulation induction with Clomid she was advised to discontinue the drug for any vision changes as this is a rare but potentially permanent side-effect if medication is continued.  We discussed timing of intercourse as well as the use of ovulation predictor kits identify the patient's fertile window each month.  - Given prior diagnosis of PCOS we discussed no real utility and proceeding with full work up as changes in laboratory parameters would not alter pregnancy management or pregnancy outcomes - we discussed letrozole is now the preferred agent for PCOS - start Provera  x 10 days if not menses by end of next week    2) Preconception counseling - start [facility] while trying to conceive.     3) Telephone time 16:32  4) Return if symptoms worsen or fail to improve.    Vena Austria, MD, [facility], [facility] 08/31/2018, 3:28 PM

## 2018-09-19 ENCOUNTER — Encounter: Payer: Self-pay | Admitting: Family Medicine

## 2018-09-21 ENCOUNTER — Other Ambulatory Visit: Payer: Self-pay | Admitting: Obstetrics and Gynecology

## 2018-09-21 DIAGNOSIS — N970 Female infertility associated with anovulation: Secondary | ICD-10-CM

## 2018-09-21 DIAGNOSIS — E282 Polycystic ovarian syndrome: Secondary | ICD-10-CM

## 2018-09-21 MED ORDER — LETROZOLE 2.5 MG PO TABS: 2.5000 mg | ORAL_TABLET | Freq: Every day | ORAL | 0 refills | Status: AC

## 2018-09-21 NOTE — Progress Notes (Signed)
LMP 09/21/2018 Cycle I letrzole 2.5mg  day 21 progesterone 10/11/2018

## 2018-09-21 NOTE — Telephone Encounter (Signed)
Lab visit 6/24

## 2018-09-25 ENCOUNTER — Other Ambulatory Visit: Payer: Self-pay

## 2018-09-25 ENCOUNTER — Other Ambulatory Visit: Payer: No Typology Code available for payment source

## 2018-09-25 DIAGNOSIS — E039 Hypothyroidism, unspecified: Secondary | ICD-10-CM

## 2018-09-26 LAB — TSH: TSH: 4.48 u[IU]/mL (ref 0.450–4.500)

## 2018-10-04 ENCOUNTER — Telehealth: Payer: Self-pay

## 2018-10-04 NOTE — Telephone Encounter (Signed)
Called and left a message letting patient know that the labs were released to mychart \\Copied  from [place] [id number]. Topic: General - Other >> Oct 02, 2018  9:30 AM Celene Kras A wrote: Reason for CRM: Pt called requesting to have her lab results from 09/25/2018. Please advise.

## 2018-10-06 ENCOUNTER — Other Ambulatory Visit: Payer: Self-pay | Admitting: Family Medicine

## 2018-10-06 DIAGNOSIS — E039 Hypothyroidism, unspecified: Secondary | ICD-10-CM

## 2018-10-06 MED ORDER — LEVOTHYROXINE SODIUM 100 MCG PO TABS: 100.0000 ug | ORAL_TABLET | Freq: Every day | ORAL | 2 refills | Status: DC

## 2018-10-10 ENCOUNTER — Telehealth: Payer: Self-pay | Admitting: Obstetrics and Gynecology

## 2018-10-10 NOTE — Telephone Encounter (Signed)
Patient was schedule for tomorrow 6/23/20for lab. Patient is requesting labs be released for over at the Hospital. Please advise

## 2018-10-11 ENCOUNTER — Other Ambulatory Visit: Payer: No Typology Code available for payment source

## 2018-10-11 ENCOUNTER — Other Ambulatory Visit: Payer: Self-pay

## 2018-10-11 ENCOUNTER — Other Ambulatory Visit
Admission: RE | Admit: 2018-10-11 | Discharge: 2018-10-11 | Disposition: A | Discharge status: Home / Self Care | Payer: No Typology Code available for payment source | Source: Ambulatory Visit | Attending: Obstetrics and Gynecology | Admitting: Obstetrics and Gynecology

## 2018-10-11 ENCOUNTER — Other Ambulatory Visit: Payer: Self-pay | Admitting: Obstetrics and Gynecology

## 2018-10-11 DIAGNOSIS — E282 Polycystic ovarian syndrome: Secondary | ICD-10-CM | POA: Insufficient documentation

## 2018-10-11 DIAGNOSIS — N970 Female infertility associated with anovulation: Secondary | ICD-10-CM | POA: Insufficient documentation

## 2018-10-12 LAB — PROGESTERONE: Progesterone: <0.1 ng/mL

## 2018-10-18 ENCOUNTER — Ambulatory Visit: Payer: 59 | Admitting: Obstetrics and Gynecology

## 2018-10-19 ENCOUNTER — Other Ambulatory Visit: Payer: Self-pay | Admitting: Obstetrics and Gynecology

## 2018-10-19 MED ORDER — LETROZOLE 2.5 MG PO TABS: 5.0000 mg | ORAL_TABLET | Freq: Every day | ORAL | 0 refills | Status: DC

## 2018-10-19 MED ORDER — MEDROXYPROGESTERONE ACETATE 10 MG PO TABS: 10.0000 mg | ORAL_TABLET | Freq: Every day | ORAL | 2 refills | Status: DC

## 2018-11-24 ENCOUNTER — Other Ambulatory Visit (HOSPITAL_COMMUNITY)
Admission: RE | Admit: 2018-11-24 | Discharge: 2018-11-24 | Disposition: A | Discharge status: Home / Self Care | Payer: No Typology Code available for payment source | Source: Ambulatory Visit | Attending: Obstetrics and Gynecology | Admitting: Obstetrics and Gynecology

## 2018-11-24 ENCOUNTER — Encounter: Payer: Self-pay | Admitting: Obstetrics and Gynecology

## 2018-11-24 ENCOUNTER — Other Ambulatory Visit: Payer: Self-pay

## 2018-11-24 ENCOUNTER — Ambulatory Visit (INDEPENDENT_AMBULATORY_CARE_PROVIDER_SITE_OTHER): Payer: No Typology Code available for payment source | Admitting: Obstetrics and Gynecology

## 2018-11-24 VITALS — BP 138/76 | Ht 63.0 in | Wt 253.0 lb

## 2018-11-24 DIAGNOSIS — Z1239 Encounter for other screening for malignant neoplasm of breast: Secondary | ICD-10-CM

## 2018-11-24 DIAGNOSIS — Z124 Encounter for screening for malignant neoplasm of cervix: Secondary | ICD-10-CM | POA: Insufficient documentation

## 2018-11-24 DIAGNOSIS — Z01419 Encounter for gynecological examination (general) (routine) without abnormal findings: Secondary | ICD-10-CM | POA: Diagnosis not present

## 2018-11-24 DIAGNOSIS — E039 Hypothyroidism, unspecified: Secondary | ICD-10-CM

## 2018-11-24 DIAGNOSIS — N970 Female infertility associated with anovulation: Secondary | ICD-10-CM

## 2018-11-24 MED ORDER — MEDROXYPROGESTERONE ACETATE 10 MG PO TABS: 10.0000 mg | ORAL_TABLET | Freq: Every day | ORAL | 2 refills | Status: DC

## 2018-11-24 MED ORDER — METFORMIN HCL 500 MG PO TABS: 500.0000 mg | ORAL_TABLET | Freq: Two times a day (BID) | ORAL | 11 refills | Status: DC

## 2018-11-24 MED ORDER — LETROZOLE 2.5 MG PO TABS: 7.5000 mg | ORAL_TABLET | Freq: Every day | ORAL | 0 refills | Status: DC

## 2018-11-24 NOTE — Patient Instructions (Signed)
Start provera 10mg  po daily - call me when you start you period to order the progesterone  - start the letrozole 7.5mg  one day 3 of your period (3rd day of bleeding) - we will add metformin 500mg  by mouth twice daily

## 2018-11-24 NOTE — Progress Notes (Signed)
Gynecology Annual Exam   PCP: Valerie Roys, DO  Chief Complaint:  Chief Complaint  Patient presents with  . Gynecologic Exam    today is day 21, labwork    History of Present Illness: Patient is a 33 y.o. G1P1001 presents for annual exam. The patient has no complaints today.   LMP: Patient's last menstrual period was 11/04/2018 (exact date). Irregular but currently undergoing ovulation induction for infertility  The patient is sexually active. She currently uses none for contraception. She denies dyspareunia.   There is no notable family history of breast or ovarian cancer in her family.  The patient wears seatbelts: no.   The patient has regular exercise: not asked.    The patient denies current symptoms of depression.    Review of Systems: Review of Systems  Constitutional: Negative for chills and fever.  HENT: Negative for congestion.   Respiratory: Negative for cough and shortness of breath.   Cardiovascular: Negative for chest pain and palpitations.  Gastrointestinal: Negative for abdominal pain, constipation, diarrhea, heartburn, nausea and vomiting.  Genitourinary: Negative for dysuria, frequency and urgency.  Skin: Negative for itching and rash.  Neurological: Negative for dizziness and headaches.  Endo/Heme/Allergies: Negative for polydipsia.  Psychiatric/Behavioral: Negative for depression.    Past Medical History:  Past Medical History:  Diagnosis Date  . Anxiety   . Depression   . Hyperlipidemia   . Migraines   . Obesity   . PCOS (polycystic ovarian syndrome)     Past Surgical History:  Past Surgical History:  Procedure Laterality Date  . APPENDECTOMY  2013  . HUMERUS FRACTURE SURGERY Left 2002,2003   rod and screws placed and removed  . TONSILLECTOMY AND ADENOIDECTOMY  2004  . WISDOM TOOTH EXTRACTION  2011   All    Gynecologic History:  Patient's last menstrual period was 11/04/2018 (exact date). Contraception: none  Obstetric  History: G1P1001  Family History:  Family History  Problem Relation Age of Onset  . Heart disease Mother   . Hypertension Mother   . Hyperlipidemia Mother   . Heart attack Mother   . Hyperlipidemia Father   . Hypertension Father   . Diabetes Father   . Asthma Father   . Heart attack Father 58  . Hyperlipidemia Sister   . Asthma Sister   . Polycystic ovary syndrome Sister   . Mental illness Sister        Depression  . Mental illness Brother        Depression  . Diabetes Maternal Grandmother   . Hyperlipidemia Maternal Grandmother   . Hypertension Maternal Grandmother   . Diabetes Maternal Grandfather   . Hypertension Maternal Grandfather   . Heart attack Maternal Grandfather   . Diabetes Paternal Grandmother   . Heart attack Paternal Grandfather     Social History:  Social History   Socioeconomic History  . Marital status: Married    Spouse name: Not on file  . Number of children: Not on file  . Years of education: Not on file  . Highest education level: Not on file  Occupational History  . Not on file  Social Needs  . Financial resource strain: Not on file  . Food insecurity    Worry: Not on file    Inability: Not on file  . Transportation needs    Medical: Not on file    Non-medical: Not on file  Tobacco Use  . Smoking status: Never Smoker  . Smokeless tobacco: Never  Used  Substance and Sexual Activity  . Alcohol use: No  . Drug use: No  . Sexual activity: Yes    Birth control/protection: None  Lifestyle  . Physical activity    Days per week: Not on file    Minutes per session: Not on file  . Stress: Not on file  Relationships  . Social Musicianconnections    Talks on phone: Not on file    Gets together: Not on file    Attends religious service: Not on file    Active member of club or organization: Not on file    Attends meetings of clubs or organizations: Not on file    Relationship status: Not on file  . Intimate partner violence    Fear of current  or ex partner: Not on file    Emotionally abused: Not on file    Physically abused: Not on file    Forced sexual activity: Not on file  Other Topics Concern  . Not on file  Social History Narrative  . Not on file    Allergies:  Allergies  Allergen Reactions  . Flagyl [Metronidazole] Rash  . Keflex [Cephalexin] Rash  . Septra [Sulfamethoxazole-Trimethoprim] Rash    Medications: Prior to Admission medications   Medication Sig Start Date End Date Taking? Authorizing Provider  letrozole (FEMARA) 2.5 MG tablet Take 2 tablets (5 mg total) by mouth daily. 10/19/18   Vena AustriaStaebler, Adorian Gwynne, MD  levothyroxine (SYNTHROID) 100 MCG tablet Take 1 tablet (100 mcg total) by mouth daily before breakfast. 10/06/18   Olevia PerchesJohnson, Megan P, DO  medroxyPROGESTERone (PROVERA) 10 MG tablet Take 1 tablet (10 mg total) by mouth daily. Use for ten days 10/19/18   Vena AustriaStaebler, Dynastee Brummell, MD    Physical Exam Vitals: There were no vitals taken for this visit.  General: NAD HEENT: normocephalic, anicteric Thyroid: no enlargement, no palpable nodules Pulmonary: No increased work of breathing, CTAB Cardiovascular: RRR, distal pulses 2+ Breast: Breast symmetrical, no tenderness, no palpable nodules or masses, no skin or nipple retraction present, no nipple discharge.  No axillary or supraclavicular lymphadenopathy. Abdomen: NABS, soft, non-tender, non-distended.  Umbilicus without lesions.  No hepatomegaly, splenomegaly or masses palpable. No evidence of hernia  Genitourinary:  External: Normal external female genitalia.  Normal urethral meatus, normal Bartholin's and Skene's glands.    Vagina: Normal vaginal mucosa, no evidence of prolapse.    Cervix: Grossly normal in appearance, no bleeding  Uterus: Non-enlarged, mobile, normal contour.  No CMT  Adnexa: ovaries non-enlarged, no adnexal masses  Rectal: deferred  Lymphatic: no evidence of inguinal lymphadenopathy Extremities: no edema, erythema, or tenderness Neurologic:  Grossly intact Psychiatric: mood appropriate, affect full  Female chaperone present for pelvic and breast  portions of the physical exam    Assessment: 33 y.o. G1P1001 routine annual exam  Plan: Problem List Items Addressed This Visit      Endocrine   Hypothyroid   Relevant Orders   Thyroid Panel With TSH    Other Visit Diagnoses    Encounter for gynecological examination without abnormal finding    -  Primary   Relevant Orders   Cytology - PAP   Screening for malignant neoplasm of cervix       Relevant Orders   Cytology - PAP   Breast screening       Anovulation       Relevant Orders   Progesterone      2) STI screening  was notoffered and therefore not obtained  2)  ASCCP guidelines and rational discussed.  Patient opts for every 3 years screening interval  3) Contraception - the patient is currently using  none.  She is attempting to conceive in the near future  4) Routine healthcare maintenance including cholesterol, diabetes screening discussed managed by PCP  5) Return in about 1 year (around 11/24/2019) for annual.   Vena AustriaAndreas Ranjit Ashurst, MD, Merlinda FrederickFACOG [facility], [facility] 11/24/2018, 10:00 AM

## 2018-11-25 LAB — THYROID PANEL WITH TSH
Free Thyroxine Index: 1.8 (ref 1.2–4.9)
T3 Uptake Ratio: 22 % — ABNORMAL LOW (ref 24–39)
T4, Total: 8.1 ug/dL (ref 4.5–12.0)
TSH: 3.95 u[IU]/mL (ref 0.450–4.500)

## 2018-11-25 LAB — PROGESTERONE: Progesterone: 0.5 ng/mL

## 2018-11-28 LAB — CYTOLOGY - PAP
Diagnosis: NEGATIVE
HPV: NOT DETECTED

## 2018-12-22 ENCOUNTER — Other Ambulatory Visit: Payer: Self-pay | Admitting: Obstetrics and Gynecology

## 2018-12-22 DIAGNOSIS — N970 Female infertility associated with anovulation: Secondary | ICD-10-CM

## 2018-12-22 DIAGNOSIS — E282 Polycystic ovarian syndrome: Secondary | ICD-10-CM

## 2018-12-22 MED ORDER — LETROZOLE 2.5 MG PO TABS: 7.5000 mg | ORAL_TABLET | Freq: Every day | ORAL | 0 refills | Status: DC

## 2018-12-22 MED ORDER — METFORMIN HCL 1000 MG PO TABS: 1000.0000 mg | ORAL_TABLET | Freq: Two times a day (BID) | ORAL | 6 refills | Status: DC

## 2018-12-22 NOTE — Progress Notes (Signed)
LMP 12/22/2018 Cycle II Letrozole 7.5mg  daily metformin increased to 1000mg  bid day 21 progesterone 9/24

## 2018-12-26 ENCOUNTER — Other Ambulatory Visit: Payer: No Typology Code available for payment source

## 2018-12-26 ENCOUNTER — Telehealth: Payer: Self-pay | Admitting: Obstetrics and Gynecology

## 2018-12-26 NOTE — Telephone Encounter (Signed)
PT will need to be put on Lab schedule so I can release order. Just let me know when she is on schedule.

## 2018-12-26 NOTE — Telephone Encounter (Signed)
Patient calling to have lab released due to child having surgery on 01/10/19. Patient would like order released for her to have it done at the [facility]. Please advise

## 2018-12-26 NOTE — Telephone Encounter (Signed)
-----   Message from Malachy Mood, MD sent at 12/22/2018  3:27 PM EDT ----- Regarding: labs Day 21 progesterone lab 01/11/2019 order is in

## 2018-12-26 NOTE — Telephone Encounter (Signed)
Patient is on schedule

## 2018-12-26 NOTE — Telephone Encounter (Signed)
Pt aware order has been released

## 2018-12-26 NOTE — Telephone Encounter (Signed)
Called and left voice mail for patient to call back to be schedule °

## 2019-01-11 ENCOUNTER — Other Ambulatory Visit: Payer: Self-pay

## 2019-01-11 ENCOUNTER — Other Ambulatory Visit: Payer: Self-pay | Admitting: Obstetrics and Gynecology

## 2019-01-11 ENCOUNTER — Other Ambulatory Visit: Payer: No Typology Code available for payment source

## 2019-01-11 DIAGNOSIS — N970 Female infertility associated with anovulation: Secondary | ICD-10-CM

## 2019-01-12 LAB — PROGESTERONE: Progesterone: <0.1 ng/mL

## 2019-01-22 ENCOUNTER — Other Ambulatory Visit: Payer: Self-pay | Admitting: Family Medicine

## 2019-01-22 DIAGNOSIS — E039 Hypothyroidism, unspecified: Secondary | ICD-10-CM

## 2019-01-30 ENCOUNTER — Other Ambulatory Visit: Payer: Self-pay | Admitting: Obstetrics and Gynecology

## 2019-01-30 MED ORDER — CLOMIPHENE CITRATE 50 MG PO TABS: 50.0000 mg | ORAL_TABLET | Freq: Every day | ORAL | 0 refills | Status: DC

## 2019-01-30 MED ORDER — METFORMIN HCL 1000 MG PO TABS: 1000.0000 mg | ORAL_TABLET | Freq: Two times a day (BID) | ORAL | 6 refills | Status: DC

## 2019-01-30 MED ORDER — MEDROXYPROGESTERONE ACETATE 10 MG PO TABS: 10.0000 mg | ORAL_TABLET | Freq: Every day | ORAL | 2 refills | Status: DC

## 2019-01-30 NOTE — Progress Notes (Signed)
No LMP rx provera, start clomid with next cycle as no ovulation on letrozole 7.5mg 

## 2019-02-12 ENCOUNTER — Telehealth: Payer: Self-pay

## 2019-02-12 DIAGNOSIS — M7660 Achilles tendinitis, unspecified leg: Secondary | ICD-10-CM

## 2019-02-12 NOTE — Telephone Encounter (Signed)
Routing to provider Copied from [place] [phone]. Topic: Referral - Request for Referral >> Feb 09, 2019  3:10 PM Erick Blinks wrote: Has patient seen PCP for this complaint? No. *If NO, is insurance requiring patient see PCP for this issue before PCP can refer them? Referral for which specialty: Podiatry  Preferred provider/office: Highest recommended for insurance network Reason for referral: increasing pain in achilles tendon (both but more the right) for a month  Pt requesting updates via MyChart or VM

## 2019-02-15 ENCOUNTER — Ambulatory Visit (INDEPENDENT_AMBULATORY_CARE_PROVIDER_SITE_OTHER): Payer: No Typology Code available for payment source

## 2019-02-15 ENCOUNTER — Other Ambulatory Visit: Payer: Self-pay

## 2019-02-15 ENCOUNTER — Encounter: Payer: Self-pay | Admitting: Podiatry

## 2019-02-15 ENCOUNTER — Ambulatory Visit: Payer: No Typology Code available for payment source | Admitting: Podiatry

## 2019-02-15 DIAGNOSIS — M79672 Pain in left foot: Secondary | ICD-10-CM | POA: Diagnosis not present

## 2019-02-15 DIAGNOSIS — M79671 Pain in right foot: Secondary | ICD-10-CM

## 2019-02-15 DIAGNOSIS — M7662 Achilles tendinitis, left leg: Secondary | ICD-10-CM | POA: Diagnosis not present

## 2019-02-15 DIAGNOSIS — M216X2 Other acquired deformities of left foot: Secondary | ICD-10-CM

## 2019-02-15 DIAGNOSIS — M216X1 Other acquired deformities of right foot: Secondary | ICD-10-CM

## 2019-02-15 DIAGNOSIS — M6528 Calcific tendinitis, other site: Secondary | ICD-10-CM | POA: Diagnosis not present

## 2019-02-15 DIAGNOSIS — M722 Plantar fascial fibromatosis: Secondary | ICD-10-CM

## 2019-02-16 ENCOUNTER — Encounter: Payer: Self-pay | Admitting: Podiatry

## 2019-02-16 NOTE — Progress Notes (Signed)
Subjective:  Patient ID: Cynthia George, female    DOB: 1986/03/22,  MRN: [id number]  Chief Complaint  Patient presents with  . Foot Pain    Patient presents today for bilat achilles/heel pain x months. She reports constant burning pains that has gotten worse over the past 2-3 months    33 y.o. female presents with the above complaint.  Patient presents with bilateral Achilles tendinitis.  She has had this pain for greater than 6 weeks.  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  She has tried compression socks resting and stretching elevation but none of that has helped.   Review of Systems: Negative except as noted in the HPI. Denies N/V/F/Ch.  Past Medical History:  Diagnosis Date  . Anxiety   . Depression   . Hyperlipidemia   . Migraines   . Obesity   . PCOS (polycystic ovarian syndrome)     Current Outpatient Medications:  .  clomiPHENE (CLOMID) 50 MG tablet, Take 1 tablet (50 mg total) by mouth daily., Disp: 5 tablet, Rfl: 0 .  levothyroxine (SYNTHROID) 100 MCG tablet, TAKE 1 TABLET (100 MCG TOTAL) BY MOUTH DAILY BEFORE BREAKFAST., Disp: 30 tablet, Rfl: 2 .  medroxyPROGESTERone (PROVERA) 10 MG tablet, Take 1 tablet (10 mg total) by mouth daily. Use for ten days, Disp: 10 tablet, Rfl: 2 .  metFORMIN (GLUCOPHAGE) 1000 MG tablet, Take 1 tablet (1,000 mg total) by mouth 2 (two) times daily with a meal., Disp: 60 tablet, Rfl: 6  Social History   Tobacco Use  Smoking Status Never Smoker  Smokeless Tobacco Never Used    Allergies  Allergen Reactions  . Flagyl [Metronidazole] Rash  . Keflex [Cephalexin] Rash  . Septra [Sulfamethoxazole-Trimethoprim] Rash   Objective:  There were no vitals filed for this visit. There is no height or weight on file to calculate BMI. Constitutional Well developed. Well nourished.  Vascular Dorsalis pedis pulses palpable  bilaterally. Posterior tibial pulses palpable bilaterally. Capillary refill normal to all digits.  No cyanosis or clubbing noted. Pedal hair growth normal.  Neurologic Normal speech. Oriented to person, place, and time. Epicritic sensation to light touch grossly present bilaterally.  Dermatologic Nails well groomed and normal in appearance. No open wounds. No skin lesions.  Orthopedic:  Pain on palpation to the Achilles tendon insertion.  No pain along the rest of the course of the tendon.  Positive Silfverskiold test with gastroc equinus bilaterally.  No pain at the posterior tibial tendon, peroneal tendon or ATFL ligament.   Radiographs: 3 views of skeletally mature adult foot: There is a posterior heel spur present bilaterally.  With small ossicle/osteophytes.  No plantar heel spur noted.  Accessory spurring noted at the base of the cuboid.  No arthritic changes no other bony deformities noted. Assessment:   1. Left Achilles tendinitis   2. Calcific Achilles tendinitis of right lower extremity   3. Pain in both feet    Plan:  Patient was evaluated and treated and all questions answered.  Bilateral Achilles tendinitis with calcification at the insertion -Given the amount of acute inflammation surrounding the Achilles tendinitis right greater than left, I believe the patient will benefit from a steroid injection within the Kager triangle bilaterally. -Patient was given a cam boot on the right with heel pads to help alleviate the pain from the Achilles tendon pulling on  the calcaneus. -A steroid injection was performed at bilateral Achilles Kager's triangle using 1% plain Lidocaine and 10 mg of Kenalog. This was well tolerated.  Gastroc equinus bilaterally -Educated patient on doing stretching exercises as well as given her a sheet to try to perform proper stretching exercises.  She states that she will start doing that immediately. -Patient will be seen by [facility] for custom-made  orthotics to include heel lifts      No follow-ups on file.

## 2019-02-19 ENCOUNTER — Other Ambulatory Visit: Payer: No Typology Code available for payment source

## 2019-02-19 ENCOUNTER — Other Ambulatory Visit: Payer: Self-pay | Admitting: Obstetrics and Gynecology

## 2019-02-19 ENCOUNTER — Other Ambulatory Visit: Payer: Self-pay

## 2019-02-20 LAB — PROGESTERONE: Progesterone: <0.1 ng/mL

## 2019-03-01 ENCOUNTER — Other Ambulatory Visit: Payer: Self-pay

## 2019-03-01 ENCOUNTER — Ambulatory Visit: Payer: No Typology Code available for payment source | Admitting: Orthotics

## 2019-03-01 DIAGNOSIS — M7662 Achilles tendinitis, left leg: Secondary | ICD-10-CM

## 2019-03-01 NOTE — Progress Notes (Signed)
Patient came into today to be cast for Custom Foot Orthotics. Upon recommendation of Dr. Posey Pronto Patient presents with hx of achilles tendon. Goals are ofload insertional tendonitis via b/l 1/4" lift. Plan vendor

## 2019-03-21 ENCOUNTER — Other Ambulatory Visit: Payer: Self-pay

## 2019-03-21 ENCOUNTER — Ambulatory Visit: Payer: No Typology Code available for payment source | Admitting: Orthotics

## 2019-03-21 DIAGNOSIS — M216X1 Other acquired deformities of right foot: Secondary | ICD-10-CM

## 2019-03-21 DIAGNOSIS — M7662 Achilles tendinitis, left leg: Secondary | ICD-10-CM

## 2019-03-21 DIAGNOSIS — M6528 Calcific tendinitis, other site: Secondary | ICD-10-CM

## 2019-03-21 DIAGNOSIS — M216X2 Other acquired deformities of left foot: Secondary | ICD-10-CM

## 2019-03-21 NOTE — Progress Notes (Signed)
Patient came in today to pick up custom made foot orthotics.  The goals were accomplished and the patient reported no dissatisfaction with said orthotics.  Patient was advised of breakin period and how to report any issues. 

## 2019-03-22 ENCOUNTER — Ambulatory Visit (INDEPENDENT_AMBULATORY_CARE_PROVIDER_SITE_OTHER): Payer: No Typology Code available for payment source | Admitting: Podiatry

## 2019-03-22 ENCOUNTER — Encounter: Payer: Self-pay | Admitting: Podiatry

## 2019-03-22 DIAGNOSIS — M6528 Calcific tendinitis, other site: Secondary | ICD-10-CM | POA: Diagnosis not present

## 2019-03-22 DIAGNOSIS — M216X1 Other acquired deformities of right foot: Secondary | ICD-10-CM

## 2019-03-22 DIAGNOSIS — M7662 Achilles tendinitis, left leg: Secondary | ICD-10-CM | POA: Diagnosis not present

## 2019-03-22 DIAGNOSIS — M79672 Pain in left foot: Secondary | ICD-10-CM

## 2019-03-22 DIAGNOSIS — M79671 Pain in right foot: Secondary | ICD-10-CM | POA: Diagnosis not present

## 2019-03-22 DIAGNOSIS — M216X2 Other acquired deformities of left foot: Secondary | ICD-10-CM

## 2019-03-23 ENCOUNTER — Encounter: Payer: Self-pay | Admitting: Podiatry

## 2019-03-23 NOTE — Progress Notes (Signed)
Subjective:  Patient ID: Cynthia George, female    DOB: 07/14/1985,  MRN: [id number]  Chief Complaint  Patient presents with  . Foot Pain    follow up achilles bilat   "my right foot still hurts and the orthoic is hurting my left foot"    33 y.o. female presents with the above complaint.  Patient said that she is doing pretty well.  She states that right still occasionally hurts.  However is doing much better after I gave her that injection last time.  She states that she does receive her orthotics her left orthotic has been hurting her right orthotics fits really well.  She denies any other acute complaints.  She states that she has been doing her stretching exercises.   Review of Systems: Negative except as noted in the HPI. Denies N/V/F/Ch.  Past Medical History:  Diagnosis Date  . Anxiety   . Depression   . Hyperlipidemia   . Migraines   . Obesity   . PCOS (polycystic ovarian syndrome)     Current Outpatient Medications:  .  clomiPHENE (CLOMID) 50 MG tablet, Take 1 tablet (50 mg total) by mouth daily., Disp: 5 tablet, Rfl: 0 .  levothyroxine (SYNTHROID) 100 MCG tablet, TAKE 1 TABLET (100 MCG TOTAL) BY MOUTH DAILY BEFORE BREAKFAST., Disp: 30 tablet, Rfl: 2 .  medroxyPROGESTERone (PROVERA) 10 MG tablet, Take 1 tablet (10 mg total) by mouth daily. Use for ten days, Disp: 10 tablet, Rfl: 2 .  metFORMIN (GLUCOPHAGE) 1000 MG tablet, Take 1 tablet (1,000 mg total) by mouth 2 (two) times daily with a meal., Disp: 60 tablet, Rfl: 6  Social History   Tobacco Use  Smoking Status Never Smoker  Smokeless Tobacco Never Used    Allergies  Allergen Reactions  . Flagyl [Metronidazole] Rash  . Keflex [Cephalexin] Rash  . Septra [Sulfamethoxazole-Trimethoprim] Rash   Objective:  There were no vitals filed for this visit. There is no height or weight on file to calculate BMI. Constitutional Well developed. Well nourished.  Vascular Dorsalis pedis pulses palpable bilaterally.  Posterior tibial pulses palpable bilaterally. Capillary refill normal to all digits.  No cyanosis or clubbing noted. Pedal hair growth normal.  Neurologic Normal speech. Oriented to person, place, and time. Epicritic sensation to light touch grossly present bilaterally.  Dermatologic Nails well groomed and normal in appearance. No open wounds. No skin lesions.  Orthopedic:  Mild pain on palpation to the Achilles tendon insertion.  No pain along the rest of the course of the tendon.  Positive Silfverskiold test with gastroc equinus bilaterally.  No pain at the posterior tibial tendon, peroneal tendon or ATFL ligament.   Radiographs: None. Assessment:   1. Left Achilles tendinitis   2. Calcific Achilles tendinitis of right lower extremity   3. Pain in both feet   4. Acquired equinus deformity of both feet    Plan:  Patient was evaluated and treated and all questions answered.  Bilateral Achilles tendinitis with calcification at the insertion -Patient is right Achilles has been giving her a little bit more pain but has considerably improved since the injection.  Patient and I will hold off on any more injection to the area.  She states that she is doing much better and given her extraordinary improvement in pain I believe that orthotics will help to address the Achilles insertional pull  Gastroc equinus bilaterally -Patient has obtained orthotics with heel lifts which will help address the pain secondary to equinus. -I have educated  her on breaking the orthotics and instead of wearing it directly 400% of the time.  She states that she was not doing that and will start doing that now.      No follow-ups on file.

## 2019-03-31 ENCOUNTER — Other Ambulatory Visit: Payer: Self-pay | Admitting: Obstetrics and Gynecology

## 2019-03-31 DIAGNOSIS — N970 Female infertility associated with anovulation: Secondary | ICD-10-CM

## 2019-03-31 MED ORDER — CLOMIPHENE CITRATE 50 MG PO TABS: 100.0000 mg | ORAL_TABLET | Freq: Every day | ORAL | 0 refills | Status: DC

## 2019-03-31 MED ORDER — CLOMIPHENE CITRATE 50 MG PO TABS: 100.0000 mg | ORAL_TABLET | Freq: Every day | ORAL | 0 refills | Status: AC

## 2019-03-31 NOTE — Progress Notes (Signed)
LMP 03/30/2019 cycle I letrozole 100mg  day 21 progesterone 04/20/2018

## 2019-04-02 ENCOUNTER — Telehealth: Payer: Self-pay | Admitting: Obstetrics and Gynecology

## 2019-04-02 NOTE — Telephone Encounter (Signed)
Voicemail is full unable to leave message

## 2019-04-02 NOTE — Telephone Encounter (Signed)
-----   Message from Malachy Mood, MD sent at 03/31/2019 10:08 AM EST ----- Regarding: labs Labs 04/22/2018 order in

## 2019-04-03 ENCOUNTER — Other Ambulatory Visit: Payer: Self-pay | Admitting: Obstetrics and Gynecology

## 2019-04-03 NOTE — Telephone Encounter (Signed)
Order released.

## 2019-04-03 NOTE — Telephone Encounter (Signed)
Patient is wanting to doe out patient labs. Could you release those.

## 2019-04-04 ENCOUNTER — Other Ambulatory Visit: Payer: No Typology Code available for payment source

## 2019-04-18 ENCOUNTER — Other Ambulatory Visit
Admission: RE | Admit: 2019-04-18 | Discharge: 2019-04-18 | Disposition: A | Discharge status: Home / Self Care | Payer: No Typology Code available for payment source | Source: Ambulatory Visit | Attending: Obstetrics and Gynecology | Admitting: Obstetrics and Gynecology

## 2019-04-18 DIAGNOSIS — N970 Female infertility associated with anovulation: Secondary | ICD-10-CM | POA: Diagnosis present

## 2019-04-19 LAB — PROGESTERONE: Progesterone: <0.1 ng/mL

## 2019-04-23 ENCOUNTER — Other Ambulatory Visit: Payer: No Typology Code available for payment source

## 2019-05-11 ENCOUNTER — Other Ambulatory Visit: Payer: Self-pay | Admitting: Family Medicine

## 2019-05-11 DIAGNOSIS — E039 Hypothyroidism, unspecified: Secondary | ICD-10-CM

## 2019-05-29 ENCOUNTER — Other Ambulatory Visit: Payer: Self-pay | Admitting: Obstetrics and Gynecology

## 2019-05-29 DIAGNOSIS — N970 Female infertility associated with anovulation: Secondary | ICD-10-CM

## 2019-05-29 MED ORDER — CLOMIPHENE CITRATE 50 MG PO TABS: 150.0000 mg | ORAL_TABLET | Freq: Every day | ORAL | 0 refills | Status: DC

## 2019-05-29 NOTE — Progress Notes (Signed)
LMP 05/29/2019 Cycle I clomid 150mg  day 21 progesterone 3/1

## 2019-05-29 NOTE — Telephone Encounter (Signed)
in

## 2019-05-29 NOTE — Telephone Encounter (Signed)
Patient aware via voicemail 

## 2019-05-29 NOTE — Telephone Encounter (Signed)
Progesterone lab Monday 06/18/2019

## 2019-05-29 NOTE — Telephone Encounter (Signed)
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 Please advise

## 2019-06-18 ENCOUNTER — Other Ambulatory Visit
Admission: RE | Admit: 2019-06-18 | Discharge: 2019-06-18 | Disposition: A | Discharge status: Home / Self Care | Payer: No Typology Code available for payment source | Source: Ambulatory Visit | Attending: Obstetrics and Gynecology | Admitting: Obstetrics and Gynecology

## 2019-06-18 DIAGNOSIS — N970 Female infertility associated with anovulation: Secondary | ICD-10-CM | POA: Diagnosis present

## 2019-06-18 NOTE — Addendum Note (Signed)
Addended by: Deloria Lair on: 06/18/2019 08:53 AM   Modules accepted: Orders

## 2019-06-19 ENCOUNTER — Other Ambulatory Visit: Payer: Self-pay | Admitting: Obstetrics and Gynecology

## 2019-06-19 DIAGNOSIS — N970 Female infertility associated with anovulation: Secondary | ICD-10-CM

## 2019-06-19 LAB — PROGESTERONE: Progesterone: <0.1 ng/mL

## 2019-06-22 ENCOUNTER — Ambulatory Visit (INDEPENDENT_AMBULATORY_CARE_PROVIDER_SITE_OTHER): Payer: No Typology Code available for payment source | Admitting: Family Medicine

## 2019-06-22 ENCOUNTER — Other Ambulatory Visit: Payer: Self-pay

## 2019-06-22 ENCOUNTER — Encounter: Payer: Self-pay | Admitting: Family Medicine

## 2019-06-22 VITALS — BP 128/83 | HR 78 | Temp 98.6°F | Ht 62.6 in | Wt 244.2 lb

## 2019-06-22 DIAGNOSIS — E782 Mixed hyperlipidemia: Secondary | ICD-10-CM

## 2019-06-22 DIAGNOSIS — N970 Female infertility associated with anovulation: Secondary | ICD-10-CM

## 2019-06-22 DIAGNOSIS — R7989 Other specified abnormal findings of blood chemistry: Secondary | ICD-10-CM

## 2019-06-22 DIAGNOSIS — Z0000 Encounter for general adult medical examination without abnormal findings: Secondary | ICD-10-CM

## 2019-06-22 DIAGNOSIS — E039 Hypothyroidism, unspecified: Secondary | ICD-10-CM | POA: Diagnosis not present

## 2019-06-22 DIAGNOSIS — F325 Major depressive disorder, single episode, in full remission: Secondary | ICD-10-CM | POA: Diagnosis not present

## 2019-06-22 LAB — UA/M W/RFLX CULTURE, ROUTINE
Bilirubin, UA: NEGATIVE
Glucose, UA: NEGATIVE
Ketones, UA: NEGATIVE
Leukocytes,UA: NEGATIVE
Nitrite, UA: NEGATIVE
Protein,UA: NEGATIVE
RBC, UA: NEGATIVE
Specific Gravity, UA: 1.025 (ref 1.005–1.030)
Urobilinogen, Ur: 0.2 mg/dL (ref 0.2–1.0)
pH, UA: 5 (ref 5.0–7.5)

## 2019-06-22 MED ORDER — METFORMIN HCL ER 500 MG PO TB24: 1000.0000 mg | ORAL_TABLET | Freq: Every day | ORAL | 1 refills | Status: DC

## 2019-06-22 NOTE — Progress Notes (Signed)
BP 128/83   Pulse 78   Temp 98.6 F (37 C)   Ht 5' 2.6" (1.59 m)   Wt 244 lb 4 oz (110.8 kg)   SpO2 97%   BMI 43.82 kg/m    Subjective:    Patient ID: Cynthia George, female    DOB: 1986/04/14, 34 y.o.   MRN: [id number]  HPI: Cynthia George is a 34 y.o. female presenting on 06/22/2019 for comprehensive medical examination. Current medical complaints include:  Currently struggling with infertility. Has gone through 6 cycles of clomid and femara, but nothing's worked yet. Starting to feel frustrated and would like to see reproductive endocrinology.  HYPOTHYROIDISM Thyroid control status:controlled Satisfied with current treatment? yes Medication side effects: no Medication compliance: excellent compliance Recent dose adjustment:no Fatigue: no Cold intolerance: no Heat intolerance: no Weight gain: no Weight loss: no Constipation: no Diarrhea/loose stools: no Palpitations: no Lower extremity edema: no Anxiety/depressed mood: no  HYPERLIPIDEMIA Hyperlipidemia status: excellent compliance Satisfied with current treatment?  yes Side effects:  no Supplements: none Aspirin:  no The ASCVD Risk score Cynthia George) failed to calculate for the following reasons:   The George ASCVD risk score is only valid for ages 31 to 21 Chest pain:  no Coronary artery disease:  no   She currently lives with: husband and son Menopausal Symptoms: no  Depression Screen done today and results listed below:  Depression screen [facility] 2/9 06/22/2019 06/16/2018 02/08/2018 06/13/2017 05/27/2016  Decreased Interest 2 0 1 0 0  Down, Depressed, Hopeless 2 0 1 0 0  PHQ - 2 Score 4 0 2 0 0  Altered sleeping 0 1 3 3 1   Tired, decreased energy 2 1 2 3 1   Change in appetite 0 0 0 2 0  Feeling bad or failure about yourself  2 0 0 0 0  Trouble concentrating 0 0 0 0 0  Moving slowly or fidgety/restless 0 0 0 0 0  Suicidal thoughts 0 0 0 0 0  PHQ-9 Score 8 2 7 8 2   Difficult doing work/chores  Somewhat difficult Not difficult at all Not difficult at all Not difficult at all -    Past Medical History:  Past Medical History:  Diagnosis Date  . Anxiety   . Depression   . Hyperlipidemia   . Migraines   . Obesity   . PCOS (polycystic ovarian syndrome)     Surgical History:  Past Surgical History:  Procedure Laterality Date  . APPENDECTOMY  George  . HUMERUS FRACTURE SURGERY Left 2002,2003   rod and screws placed and removed  . TONSILLECTOMY AND ADENOIDECTOMY  2004  . WISDOM TOOTH EXTRACTION  2011   All    Medications:  Current Outpatient Medications on File Prior to Visit  Medication Sig  . levothyroxine (SYNTHROID) 100 MCG tablet TAKE 1 TABLET BY MOUTH DAILY BEFORE BREAKFAST.  . medroxyPROGESTERone (PROVERA) 10 MG tablet Take 1 tablet (10 mg total) by mouth daily. Use for ten days   No current facility-administered medications on file prior to visit.    Allergies:  Allergies  Allergen Reactions  . Flagyl [Metronidazole] Rash  . Keflex [Cephalexin] Rash  . Septra [Sulfamethoxazole-Trimethoprim] Rash    Social History:  Social History   Socioeconomic History  . Marital status: Married    Spouse name: Not on file  . Number of children: Not on file  . Years of education: Not on file  . Highest education level: Not on file  Occupational History  . Not on file  Tobacco Use  . Smoking status: Never Smoker  . Smokeless tobacco: Never Used  Substance and Sexual Activity  . Alcohol use: No  . Drug use: No  . Sexual activity: Yes    Birth control/protection: None  Other Topics Concern  . Not on file  Social History Narrative  . Not on file   Social Determinants of Health   Financial Resource Strain:   . Difficulty of Paying Living Expenses: Not on file  Food Insecurity:   . Worried About Programme researcher, broadcasting/film/video in the Last Year: Not on file  . Ran Out of Food in the Last Year: Not on file  Transportation Needs:   . Lack of Transportation (Medical): Not  on file  . Lack of Transportation (Non-Medical): Not on file  Physical Activity:   . Days of Exercise per Week: Not on file  . Minutes of Exercise per Session: Not on file  Stress:   . Feeling of Stress : Not on file  Social Connections:   . Frequency of Communication with Friends and Family: Not on file  . Frequency of Social Gatherings with Friends and Family: Not on file  . Attends Religious Services: Not on file  . Active Member of Clubs or Organizations: Not on file  . Attends Banker Meetings: Not on file  . Marital Status: Not on file  Intimate Partner Violence:   . Fear of Current or Ex-Partner: Not on file  . Emotionally Abused: Not on file  . Physically Abused: Not on file  . Sexually Abused: Not on file   Social History   Tobacco Use  Smoking Status Never Smoker  Smokeless Tobacco Never Used   Social History   Substance and Sexual Activity  Alcohol Use No    Family History:  Family History  Problem Relation Age of Onset  . Heart disease Mother   . Hypertension Mother   . Hyperlipidemia Mother   . Heart attack Mother   . Hyperlipidemia Father   . Hypertension Father   . Diabetes Father   . Asthma Father   . Heart attack Father 23  . Hyperlipidemia Sister   . Asthma Sister   . Polycystic ovary syndrome Sister   . Mental illness Sister        Depression  . Mental illness Brother        Depression  . Diabetes Maternal Grandmother   . Hyperlipidemia Maternal Grandmother   . Hypertension Maternal Grandmother   . Diabetes Maternal Grandfather   . Hypertension Maternal Grandfather   . Heart attack Maternal Grandfather   . Diabetes Paternal Grandmother   . Heart attack Paternal Grandfather     Past medical history, surgical history, medications, allergies, family history and social history reviewed with patient today and changes made to appropriate areas of the chart.   Review of Systems  Constitutional: Negative.   HENT: Negative.     Eyes: Positive for blurred vision. Negative for double vision, photophobia, pain, discharge and redness.  Respiratory: Negative.   Cardiovascular: Negative.   Gastrointestinal: Negative.   Genitourinary: Negative.   Musculoskeletal: Negative.   Skin: Negative.   Neurological: Negative.   Endo/Heme/Allergies: Negative.   Psychiatric/Behavioral: Negative.     All other ROS negative except what is listed above and in the HPI.      Objective:    BP 128/83   Pulse 78   Temp 98.6 F (37 C)  Ht 5' 2.6" (1.59 m)   Wt 244 lb 4 oz (110.8 kg)   SpO2 97%   BMI 43.82 kg/m   Wt Readings from Last 3 Encounters:  06/22/19 244 lb 4 oz (110.8 kg)  11/24/18 253 lb (114.8 kg)  06/16/18 249 lb (112.9 kg)    Physical Exam Vitals and nursing note reviewed.  Constitutional:      General: She is not in acute distress.    Appearance: Normal appearance. She is not ill-appearing, toxic-appearing or diaphoretic.  HENT:     Head: Normocephalic and atraumatic.     Right Ear: Tympanic membrane, ear canal and external ear normal. There is no impacted cerumen.     Left Ear: Tympanic membrane, ear canal and external ear normal. There is no impacted cerumen.     Nose: Nose normal. No congestion or rhinorrhea.     Mouth/Throat:     Mouth: Mucous membranes are moist.     Pharynx: Oropharynx is clear. No oropharyngeal exudate or posterior oropharyngeal erythema.  Eyes:     General: No scleral icterus.       Right eye: No discharge.        Left eye: No discharge.     Extraocular Movements: Extraocular movements intact.     Conjunctiva/sclera: Conjunctivae normal.     Pupils: Pupils are equal, round, and reactive to light.  Neck:     Vascular: No carotid bruit.  Cardiovascular:     Rate and Rhythm: Normal rate and regular rhythm.     Pulses: Normal pulses.     Heart sounds: No murmur. No friction rub. No gallop.   Pulmonary:     Effort: Pulmonary effort is normal. No respiratory distress.      Breath sounds: Normal breath sounds. No stridor. No wheezing, rhonchi or rales.  Chest:     Chest wall: No tenderness.  Abdominal:     General: Abdomen is flat. Bowel sounds are normal. There is no distension.     Palpations: Abdomen is soft. There is no mass.     Tenderness: There is no abdominal tenderness. There is no right CVA tenderness, left CVA tenderness, guarding or rebound.     Hernia: No hernia is present.  Genitourinary:    Comments: Breast and pelvic exams deferred with shared decision making Musculoskeletal:        General: No swelling, tenderness, deformity or signs of injury.     Cervical back: Normal range of motion and neck supple. No rigidity. No muscular tenderness.     Right lower leg: No edema.     Left lower leg: No edema.  Lymphadenopathy:     Cervical: No cervical adenopathy.  Skin:    General: Skin is warm and dry.     Capillary Refill: Capillary refill takes less than 2 seconds.     Coloration: Skin is not jaundiced or pale.     Findings: No bruising, erythema, lesion or rash.  Neurological:     General: No focal deficit present.     Mental Status: She is alert and oriented to person, place, and time. Mental status is at baseline.     Cranial Nerves: No cranial nerve deficit.     Sensory: No sensory deficit.     Motor: No weakness.     Coordination: Coordination normal.     Gait: Gait normal.     Deep Tendon Reflexes: Reflexes normal.  Psychiatric:        Mood and Affect: Mood  normal.        Behavior: Behavior normal.        Thought Content: Thought content normal.        Judgment: Judgment normal.     Results for orders placed or performed in visit on 06/22/19  CBC with Differential/Platelet  Result Value Ref Range   WBC 6.6 3.4 - 10.8 x10E3/uL   RBC 4.68 3.77 - 5.28 x10E6/uL   Hemoglobin 12.2 11.1 - 15.9 g/dL   Hematocrit 41.9 62.2 - 46.6 %   MCV 82 79 - 97 fL   MCH 26.1 (L) 26.6 - 33.0 pg   MCHC 31.9 31.5 - 35.7 g/dL   RDW 29.7 98.9 -  21.1 %   Platelets 311 150 - 450 x10E3/uL   Neutrophils 61 Not Estab. %   Lymphs 26 Not Estab. %   Monocytes 7 Not Estab. %   Eos 5 Not Estab. %   Basos 1 Not Estab. %   Neutrophils Absolute 4.1 1.4 - 7.0 x10E3/uL   Lymphocytes Absolute 1.7 0.7 - 3.1 x10E3/uL   Monocytes Absolute 0.4 0.1 - 0.9 x10E3/uL   EOS (ABSOLUTE) 0.3 0.0 - 0.4 x10E3/uL   Basophils Absolute 0.1 0.0 - 0.2 x10E3/uL   Immature Granulocytes 0 Not Estab. %   Immature Grans (Abs) 0.0 0.0 - 0.1 x10E3/uL  Comprehensive metabolic panel  Result Value Ref Range   Glucose 89 65 - 99 mg/dL   BUN 10 6 - 20 mg/dL   Creatinine, Ser 9.41 0.57 - 1.00 mg/dL   GFR calc non Af Amer 118 >59 mL/min/1.73   GFR calc Af Amer 137 >59 mL/min/1.73   BUN/Creatinine Ratio 16 9 - 23   Sodium 140 134 - 144 mmol/L   Potassium 4.8 3.5 - 5.2 mmol/L   Chloride 104 96 - 106 mmol/L   CO2 25 20 - 29 mmol/L   Calcium 9.4 8.7 - 10.2 mg/dL   Total Protein 7.0 6.0 - 8.5 g/dL   Albumin 4.2 3.8 - 4.8 g/dL   Globulin, Total 2.8 1.5 - 4.5 g/dL   Albumin/Globulin Ratio 1.5 1.2 - 2.2   Bilirubin Total 0.6 0.0 - 1.2 mg/dL   Alkaline Phosphatase 55 39 - 117 IU/L   AST 26 0 - 40 IU/L   ALT 49 (H) 0 - 32 IU/L  Lipid Panel w/o Chol/HDL Ratio  Result Value Ref Range   Cholesterol, Total 236 (H) 100 - 199 mg/dL   Triglycerides 740 (H) 0 - 149 mg/dL   HDL 25 (L) >81 mg/dL   VLDL Cholesterol Cal 51 (H) 5 - 40 mg/dL   LDL Chol Calc (NIH) 448 (H) 0 - 99 mg/dL  TSH  Result Value Ref Range   TSH 1.240 0.450 - 4.500 uIU/mL  UA/M w/rflx Culture, Routine   Specimen: Urine   URINE  Result Value Ref Range   Specific Gravity, UA 1.025 1.005 - 1.030   pH, UA 5.0 5.0 - 7.5   Color, UA Yellow Yellow   Appearance Ur Clear Clear   Leukocytes,UA Negative Negative   Protein,UA Negative Negative/Trace   Glucose, UA Negative Negative   Ketones, UA Negative Negative   RBC, UA Negative Negative   Bilirubin, UA Negative Negative   Urobilinogen, Ur 0.2 0.2 - 1.0  mg/dL   Nitrite, UA Negative Negative      Assessment & Plan:   Problem List Items Addressed This Visit      Endocrine   Hypothyroid    Rechecking levels today.  Await results. Call with any concerns.       Relevant Orders   Comprehensive metabolic panel (Completed)   TSH (Completed)     Genitourinary   Infertility associated with anovulation    Referral to reproductive endocrinology made today. Call with any concerns.       Relevant Orders   Ambulatory referral to Endocrinology     Other   HLD (hyperlipidemia)    Rechecking levels today. Will not start statin as she's trying to get pregnant. Call with any concerns.       Relevant Orders   Comprehensive metabolic panel (Completed)   Lipid Panel w/o Chol/HDL Ratio (Completed)   Elevated LFTs    Rechecking labs today. Await results.       Relevant Orders   Comprehensive metabolic panel (Completed)   Morbid obesity (HCC)    Continue to work on diet and exercise with goal of losing 1-2lbs per week. Continue to monitor.       Relevant Medications   metFORMIN (GLUCOPHAGE-XR) 500 MG 24 hr tablet   Major depression, single episode, in complete remission [facility])    Doing well not on medicine. Continue to monitor. Call with any concerns.        Other Visit Diagnoses    Routine general medical examination at a health care facility    -  Primary   Vaccines up to date. Screening labs checked today. Pap up to date. Continue diet and exercise. Call with any concerns.    Relevant Orders   CBC with Differential/Platelet (Completed)   Comprehensive metabolic panel (Completed)   Lipid Panel w/o Chol/HDL Ratio (Completed)   TSH (Completed)   UA/M w/rflx Culture, Routine (Completed)       Follow up plan: Return in about 1 year (around 06/21/2020) for Physical.   LABORATORY TESTING:  - Pap smear: up to date  IMMUNIZATIONS:   - Tdap: Tetanus vaccination status reviewed: last tetanus booster within 10 years. - Influenza: Up  to date  PATIENT COUNSELING:   Advised to take 1 mg of folate supplement per day if capable of pregnancy.   Sexuality: Discussed sexually transmitted diseases, partner selection, use of condoms, avoidance of unintended pregnancy  and contraceptive alternatives.   Advised to avoid cigarette smoking.  I discussed with the patient that most people either abstain from alcohol or drink within safe limits (<=14/week and <=4 drinks/occasion for males, <=7/weeks and <= 3 drinks/occasion for females) and that the risk for alcohol disorders and other health effects rises proportionally with the number of drinks per week and how often a drinker exceeds daily limits.  Discussed cessation/primary prevention of drug use and availability of treatment for abuse.   Diet: Encouraged to adjust caloric intake to maintain  or achieve ideal body weight, to reduce intake of dietary saturated fat and total fat, to limit sodium intake by avoiding high sodium foods and not adding table salt, and to maintain adequate dietary potassium and calcium preferably from fresh fruits, vegetables, and low-fat dairy products.    stressed the importance of regular exercise  Injury prevention: Discussed safety belts, safety helmets, smoke detector, smoking near bedding or upholstery.   Dental health: Discussed importance of regular tooth brushing, flossing, and dental visits.    NEXT PREVENTATIVE PHYSICAL DUE IN 1 YEAR. Return in about 1 year (around 06/21/2020) for Physical.

## 2019-06-22 NOTE — Patient Instructions (Signed)
Health Maintenance, Female Adopting a healthy lifestyle and getting preventive care are important in promoting health and wellness. Ask your health care provider about:  The right schedule for you to have regular tests and exams.  Things you can do on your own to prevent diseases and keep yourself healthy. What should I know about diet, weight, and exercise? Eat a healthy diet   Eat a diet that includes plenty of vegetables, fruits, low-fat dairy products, and lean protein.  Do not eat a lot of foods that are high in solid fats, added sugars, or sodium. Maintain a healthy weight Body mass index (BMI) is used to identify weight problems. It estimates body fat based on height and weight. Your health care provider can help determine your BMI and help you achieve or maintain a healthy weight. Get regular exercise Get regular exercise. This is one of the most important things you can do for your health. Most adults should:  Exercise for at least 150 minutes each week. The exercise should increase your heart rate and make you sweat (moderate-intensity exercise).  Do strengthening exercises at least twice a week. This is in addition to the moderate-intensity exercise.  Spend less time sitting. Even light physical activity can be beneficial. Watch cholesterol and blood lipids Have your blood tested for lipids and cholesterol at 33 years of age, then have this test every 5 years. Have your cholesterol levels checked more often if:  Your lipid or cholesterol levels are high.  You are older than 33 years of age.  You are at high risk for heart disease. What should I know about cancer screening? Depending on your health history and family history, you may need to have cancer screening at various ages. This may include screening for:  Breast cancer.  Cervical cancer.  Colorectal cancer.  Skin cancer.  Lung cancer. What should I know about heart disease, diabetes, and high blood  pressure? Blood pressure and heart disease  High blood pressure causes heart disease and increases the risk of stroke. This is more likely to develop in people who have high blood pressure readings, are of African descent, or are overweight.  Have your blood pressure checked: ? Every 3-5 years if you are 18-39 years of age. ? Every year if you are 40 years old or older. Diabetes Have regular diabetes screenings. This checks your fasting blood sugar level. Have the screening done:  Once every three years after age 40 if you are at a normal weight and have a low risk for diabetes.  More often and at a younger age if you are overweight or have a high risk for diabetes. What should I know about preventing infection? Hepatitis B If you have a higher risk for hepatitis B, you should be screened for this virus. Talk with your health care provider to find out if you are at risk for hepatitis B infection. Hepatitis C Testing is recommended for:  Everyone born from 1945 through 1965.  Anyone with known risk factors for hepatitis C. Sexually transmitted infections (STIs)  Get screened for STIs, including gonorrhea and chlamydia, if: ? You are sexually active and are younger than 33 years of age. ? You are older than 33 years of age and your health care provider tells you that you are at risk for this type of infection. ? Your sexual activity has changed since you were last screened, and you are at increased risk for chlamydia or gonorrhea. Ask your health care provider if   you are at risk.  Ask your health care provider about whether you are at high risk for HIV. Your health care provider may recommend a prescription medicine to help prevent HIV infection. If you choose to take medicine to prevent HIV, you should first get tested for HIV. You should then be tested every 3 months for as long as you are taking the medicine. Pregnancy  If you are about to stop having your period (premenopausal) and  you may become pregnant, seek counseling before you get pregnant.  Take 400 to 800 micrograms (mcg) of folic acid every day if you become pregnant.  Ask for birth control (contraception) if you want to prevent pregnancy. Osteoporosis and menopause Osteoporosis is a disease in which the bones lose minerals and strength with aging. This can result in bone fractures. If you are 65 years old or older, or if you are at risk for osteoporosis and fractures, ask your health care provider if you should:  Be screened for bone loss.  Take a calcium or vitamin D supplement to lower your risk of fractures.  Be given hormone replacement therapy (HRT) to treat symptoms of menopause. Follow these instructions at home: Lifestyle  Do not use any products that contain nicotine or tobacco, such as cigarettes, e-cigarettes, and chewing tobacco. If you need help quitting, ask your health care provider.  Do not use street drugs.  Do not share needles.  Ask your health care provider for help if you need support or information about quitting drugs. Alcohol use  Do not drink alcohol if: ? Your health care provider tells you not to drink. ? You are pregnant, may be pregnant, or are planning to become pregnant.  If you drink alcohol: ? Limit how much you use to 0-1 drink a day. ? Limit intake if you are breastfeeding.  Be aware of how much alcohol is in your drink. In the [place] one drink equals one 12 oz bottle of beer (355 mL), one 5 oz glass of wine (148 mL), or one 1 oz glass of hard liquor (44 mL). General instructions  Schedule regular health, dental, and eye exams.  Stay current with your vaccines.  Tell your health care provider if: ? You often feel depressed. ? You have ever been abused or do not feel safe at home. Summary  Adopting a healthy lifestyle and getting preventive care are important in promoting health and wellness.  Follow your health care provider's instructions about healthy  diet, exercising, and getting tested or screened for diseases.  Follow your health care provider's instructions on monitoring your cholesterol and blood pressure. This information is not intended to replace advice given to you by your health care provider. Make sure you discuss any questions you have with your health care provider. Document Revised: 03/29/2018 Document Reviewed: 03/29/2018 Elsevier Patient Education  2020 Elsevier Inc.  

## 2019-06-23 DIAGNOSIS — N970 Female infertility associated with anovulation: Secondary | ICD-10-CM | POA: Insufficient documentation

## 2019-06-23 LAB — COMPREHENSIVE METABOLIC PANEL
ALT: 49 IU/L — ABNORMAL HIGH (ref 0–32)
AST: 26 IU/L (ref 0–40)
Albumin/Globulin Ratio: 1.5 (ref 1.2–2.2)
Albumin: 4.2 g/dL (ref 3.8–4.8)
Alkaline Phosphatase: 55 IU/L (ref 39–117)
BUN/Creatinine Ratio: 16 (ref 9–23)
BUN: 10 mg/dL (ref 6–20)
Bilirubin Total: 0.6 mg/dL (ref 0.0–1.2)
CO2: 25 mmol/L (ref 20–29)
Calcium: 9.4 mg/dL (ref 8.7–10.2)
Chloride: 104 mmol/L (ref 96–106)
Creatinine, Ser: 0.64 mg/dL (ref 0.57–1.00)
GFR calc Af Amer: 137 mL/min/{1.73_m2} (ref >59)
GFR calc non Af Amer: 118 mL/min/{1.73_m2} (ref >59)
Globulin, Total: 2.8 g/dL (ref 1.5–4.5)
Glucose: 89 mg/dL (ref 65–99)
Potassium: 4.8 mmol/L (ref 3.5–5.2)
Sodium: 140 mmol/L (ref 134–144)
Total Protein: 7 g/dL (ref 6.0–8.5)

## 2019-06-23 LAB — TSH: TSH: 1.24 u[IU]/mL (ref 0.450–4.500)

## 2019-06-23 LAB — CBC WITH DIFFERENTIAL/PLATELET
Basophils Absolute: 0.1 10*3/uL (ref 0.0–0.2)
Basos: 1 %
EOS (ABSOLUTE): 0.3 10*3/uL (ref 0.0–0.4)
Eos: 5 %
Hematocrit: 38.2 % (ref 34.0–46.6)
Hemoglobin: 12.2 g/dL (ref 11.1–15.9)
Immature Grans (Abs): 0 10*3/uL (ref 0.0–0.1)
Immature Granulocytes: 0 %
Lymphocytes Absolute: 1.7 10*3/uL (ref 0.7–3.1)
Lymphs: 26 %
MCH: 26.1 pg — ABNORMAL LOW (ref 26.6–33.0)
MCHC: 31.9 g/dL (ref 31.5–35.7)
MCV: 82 fL (ref 79–97)
Monocytes Absolute: 0.4 10*3/uL (ref 0.1–0.9)
Monocytes: 7 %
Neutrophils Absolute: 4.1 10*3/uL (ref 1.4–7.0)
Neutrophils: 61 %
Platelets: 311 10*3/uL (ref 150–450)
RBC: 4.68 x10E6/uL (ref 3.77–5.28)
RDW: 14.4 % (ref 11.7–15.4)
WBC: 6.6 10*3/uL (ref 3.4–10.8)

## 2019-06-23 LAB — LIPID PANEL W/O CHOL/HDL RATIO
Cholesterol, Total: 236 mg/dL — ABNORMAL HIGH (ref 100–199)
HDL: 25 mg/dL — ABNORMAL LOW (ref >39)
LDL Chol Calc (NIH): 160 mg/dL — ABNORMAL HIGH (ref 0–99)
Triglycerides: 270 mg/dL — ABNORMAL HIGH (ref 0–149)
VLDL Cholesterol Cal: 51 mg/dL — ABNORMAL HIGH (ref 5–40)

## 2019-06-23 NOTE — Assessment & Plan Note (Signed)
Doing well not on medicine. Continue to monitor. Call with any concerns.  

## 2019-06-23 NOTE — Assessment & Plan Note (Signed)
Continue to work on diet and exercise with goal of losing 1-2 lbs per week. Continue to monitor.  

## 2019-06-23 NOTE — Assessment & Plan Note (Signed)
Referral to reproductive endocrinology made today. Call with any concerns.

## 2019-06-23 NOTE — Assessment & Plan Note (Signed)
Rechecking labs today. Await results.  

## 2019-06-23 NOTE — Assessment & Plan Note (Signed)
Rechecking levels today. Will not start statin as she's trying to get pregnant. Call with any concerns.

## 2019-06-23 NOTE — Assessment & Plan Note (Signed)
Rechecking levels today. Await results. Call with any concerns.  

## 2019-08-17 ENCOUNTER — Other Ambulatory Visit: Payer: Self-pay | Admitting: Family Medicine

## 2019-08-17 DIAGNOSIS — E039 Hypothyroidism, unspecified: Secondary | ICD-10-CM

## 2019-08-28 ENCOUNTER — Encounter: Payer: Self-pay | Admitting: Podiatry

## 2019-08-28 ENCOUNTER — Other Ambulatory Visit: Payer: Self-pay | Admitting: Podiatry

## 2019-08-28 ENCOUNTER — Encounter: Payer: Self-pay | Admitting: *Deleted

## 2019-08-28 ENCOUNTER — Ambulatory Visit: Payer: No Typology Code available for payment source | Admitting: Podiatry

## 2019-08-28 ENCOUNTER — Other Ambulatory Visit: Payer: Self-pay

## 2019-08-28 DIAGNOSIS — M7661 Achilles tendinitis, right leg: Secondary | ICD-10-CM

## 2019-08-28 DIAGNOSIS — M7662 Achilles tendinitis, left leg: Secondary | ICD-10-CM | POA: Diagnosis not present

## 2019-08-28 MED ORDER — MELOXICAM 15 MG PO TABS: 15.0000 mg | ORAL_TABLET | Freq: Every day | ORAL | 1 refills | Status: DC

## 2019-08-28 MED ORDER — METHYLPREDNISOLONE 4 MG PO TBPK: ORAL_TABLET | ORAL | 0 refills | Status: DC

## 2019-08-31 NOTE — Progress Notes (Signed)
   HPI: 34 y.o. female presenting today for follow up evaluation of bilateral foot pain. She states the pain flared up about one month ago and has actually improved a little. She reports associated swelling. Touching the Achilles area increases the pain. She has been using her custom orthotics as directed. Patient is here for further evaluation and treatment.   Past Medical History:  Diagnosis Date  . Anxiety   . Depression   . Hyperlipidemia   . Migraines   . Obesity   . PCOS (polycystic ovarian syndrome)       Physical Exam: General: The patient is alert and oriented x3 in no acute distress.  Dermatology: Skin is warm, dry and supple bilateral lower extremities. Negative for open lesions or macerations.  Vascular: Palpable pedal pulses bilaterally. No edema or erythema noted. Capillary refill within normal limits.  Neurological: Epicritic and protective threshold grossly intact bilaterally.   Musculoskeletal Exam: Pain on palpation noted to the posterior tubercle of the bilateral calcaneus at the insertion of the Achilles tendon consistent with retrocalcaneal bursitis. Range of motion within normal limits. Muscle strength 5/5 in all muscle groups bilateral lower extremities.     Assessment: 1. Insertional Achilles tendinitis bilateral    Plan of Care:  1. Patient was evaluated.  2. Prescription for Medrol Dose Pak provided to patient. 3. Prescription for Meloxicam provided to patient. 4. Continue using custom orthotics.  5. Physical therapy ordered from [facility].  6. Return to clinic in 4 weeks.   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   Felecia Shelling, DPM [facility]  Dr. Felecia Shelling, DPM    [address]                                        [place], [place] [address]                Office [phone]  Fax [phone]

## 2019-09-04 ENCOUNTER — Telehealth: Payer: Self-pay

## 2019-09-04 DIAGNOSIS — M7662 Achilles tendinitis, left leg: Secondary | ICD-10-CM

## 2019-09-04 DIAGNOSIS — M7661 Achilles tendinitis, right leg: Secondary | ICD-10-CM

## 2019-09-04 NOTE — Telephone Encounter (Signed)
Patient called stated that [facility] is not in network and requested to be referred to a [facility].   New referral has been sent to [facility] main outpt pt and patient has been notified via voice mail

## 2019-09-15 ENCOUNTER — Encounter: Payer: Self-pay | Admitting: Family Medicine

## 2019-09-15 DIAGNOSIS — M7662 Achilles tendinitis, left leg: Secondary | ICD-10-CM

## 2019-09-27 ENCOUNTER — Other Ambulatory Visit: Payer: Self-pay

## 2019-09-27 ENCOUNTER — Ambulatory Visit: Payer: No Typology Code available for payment source | Attending: Podiatry | Admitting: Physical Therapy

## 2019-09-27 ENCOUNTER — Encounter: Payer: Self-pay | Admitting: Physical Therapy

## 2019-09-27 DIAGNOSIS — M79661 Pain in right lower leg: Secondary | ICD-10-CM | POA: Insufficient documentation

## 2019-09-27 DIAGNOSIS — R262 Difficulty in walking, not elsewhere classified: Secondary | ICD-10-CM | POA: Diagnosis present

## 2019-09-27 DIAGNOSIS — M6281 Muscle weakness (generalized): Secondary | ICD-10-CM | POA: Insufficient documentation

## 2019-09-27 DIAGNOSIS — M79662 Pain in left lower leg: Secondary | ICD-10-CM | POA: Insufficient documentation

## 2019-09-27 NOTE — Therapy (Signed)
[facility] MAIN [facility] SERVICES [address] [place], [place], [address] Phone: [phone]   Fax:  [phone]  Physical Therapy Evaluation  Patient Details  Name: Cynthia George MRN: [id number] Date of Birth: 16-Apr-1986 Referring Provider (PT): Gala Lewandowsky   Encounter Date: 09/27/2019   PT End of Session - 09/27/19 1709    Visit Number 1    Number of Visits 17    Date for PT Re-Evaluation 11/22/19    PT Start Time 0500    PT Stop Time 0600    PT Time Calculation (min) 60 min    Equipment Utilized During Treatment Gait belt    Activity Tolerance Patient tolerated treatment well    Behavior During Therapy [facility] for tasks assessed/performed           Past Medical History:  Diagnosis Date  . Anxiety   . Depression   . Hyperlipidemia   . Migraines   . Obesity   . PCOS (polycystic ovarian syndrome)     Past Surgical History:  Procedure Laterality Date  . APPENDECTOMY  2013  . HUMERUS FRACTURE SURGERY Left 2002,2003   rod and screws placed and removed  . TONSILLECTOMY AND ADENOIDECTOMY  2004  . WISDOM TOOTH EXTRACTION  2011   All    There were no vitals filed for this visit.    Subjective Assessment - 09/27/19 1711    Pertinent History Patient has pain in B heels/calfs a year ago 6/21. . she had cortisone shots in october, in both feet, then she wore a boot on the right foot.The pain was a litle better on the Left. Then 08/2019 she was put on steroids and antiinflamatory and she has had not a significant change in pain to feet.  Her right foot is the worst, left foot is not as bad. 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   How long can you sit comfortably? no time limit    How long can you stand comfortably? unable to answer    How long can you walk comfortably? unable to answer    Currently in Pain? Yes    Pain Score 6     Pain Location Foot    Pain Orientation Right    Pain  Descriptors / Indicators Constant;Aching;Burning    Pain Type Chronic pain    Pain Onset More than a month ago    Pain Frequency Constant    Aggravating Factors  standing    Pain Relieving Factors nothing    Effect of Pain on Daily Activities makes it worse              [facility] PT Assessment - 09/27/19 1719      Assessment   Medical Diagnosis Achilles tendonitis, bilateral    Referring Provider (PT) Gala Lewandowsky    Onset Date/Surgical Date --   11/2018   Hand Dominance Right    Next MD Visit 09/27/19    Prior Therapy no      Precautions   Precautions None      Restrictions   Weight Bearing Restrictions No      Balance Screen   Has the patient fallen in the past 6 months No    Has the patient had a decrease in activity level because of a fear of falling?  Yes    Is the patient reluctant to leave their home because of a fear of falling?  No      Home Nurse, mental health Private residence    Living Arrangements Spouse/significant other;Children    Available Help at Discharge Family    Type of Home House    Home Access Stairs to enter    Entrance Stairs-Number of Steps 6    Entrance Stairs-Rails Right;Left    Home Layout One level    Home Equipment None      Prior Function   Level of Independence Independent    Vocation Full time employment    Vocation Requirements standing, walking, bending, lifting, pushing    Leisure softball      Cognition   Overall Cognitive Status Within Functional Limits for tasks assessed          Palpation: tenderness to palpation  R achilles tendon and lateral ankle R  Tenderness to palpation L achilles tendon   POSTURE:WFL  PROM/AROM: DF limited 5 deg B ankles  Unable to heel raise RLE Able to perform 5 heel raises with pain LLE  STRENGTH:  Graded on a 0-5 scale Muscle Group Left Right                                                      Ankle DF 4/5 4/5  Ankle PF 3/5* 3/5*  Ankle eversion / inversion  4/5 B ankle SENSATION:WNL BLE   FUNCTIONAL MOBILITY: independent with transfer and mobility   GAIT: independent without AD, decreased gait speed, difficulty with steps   OUTCOME MEASURES: TEST Outcome Interpretation  5 times sit<>stand 13.81sec >60 yo, >15 sec indicates increased risk for falls  10 meter walk test   1.28              m/s <1.0 m/s indicates increased risk for falls; limited community ambulator  Timed up and Go    6.78             sec <14 sec indicates increased risk for falls                      Objective measurements completed on examination: See above findings.               PT Education - 09/27/19 1708    Education Details plan of care    Person(s) Educated Patient    Methods Explanation    Comprehension Verbalized understanding            PT Short Term Goals - 09/27/19 1755      PT SHORT TERM GOAL #1   Title Patient will be independent in home exercise program to improve strength/mobility for better functional independence with ADLs.    Time 4    Period Weeks    Status New    Target Date 10/25/19             PT Long Term Goals - 09/27/19 1755      PT LONG TERM GOAL #1   Title Patient will increase LEFS score 8 points to demonstrate better functional mobility    Baseline need to do survey    Time 8    Period Weeks    Status New    Target Date 11/22/19      PT LONG TERM GOAL #2   Title Patient will report a worst pain of  3/10 on VAS in B ankles/feet to improve tolerance with ADLs and reduced symptoms with activities    Baseline 8/10 R foot, 3/10 left foot    Time 8    Period Weeks    Status New    Target Date 11/22/19      PT LONG TERM GOAL #3   Title Patient will increase BLE gross strength to 4+/5 as to improve functional strength for independent gait, increased standing tolerance and increased ADL ability.    Time 8    Period Weeks    Status New    Target Date 11/22/19      PT LONG TERM GOAL #4   Title  Patent will be able to stand for 15 minutes with less than 5/10 pain to complete standing activities    Baseline 8/10 right foot, 3/10 left foot    Time 8    Period Weeks    Status New    Target Date 11/22/19                  Plan - 09/27/19 1709    Clinical Impression Statement Patient presents with pain B feet,  decreased gait speed, and decreased BLE ankle strength. Patient's main complaint is BLE foot pain and inability to participate in desired activities. . Patient will benefit from skilled PT in order to increase tolerance of standing and walking, and enable patient to participate in desired activities.   Personal Factors and Comorbidities Comorbidity 1    Comorbidities obesity    Examination-Activity Limitations Locomotion Level;Stairs;Sleep    Examination-Participation Restrictions Community Activity    Stability/Clinical Decision Making Stable/Uncomplicated    Clinical Decision Making Low    Rehab Potential Fair    PT Frequency 2x / week    PT Duration 8 weeks    PT Treatment/Interventions Cryotherapy;Electrical Stimulation;Iontophoresis 4mg /ml Dexamethasone;Moist Heat;Ultrasound;Gait training;Stair training;Functional mobility training;Therapeutic activities;Therapeutic exercise;Patient/family education;Manual techniques;Passive range of motion;Dry needling;Taping    PT Next Visit Plan [place], e-stim    PT Home Exercise Plan none yet    Consulted and Agree with Plan of Care Patient           Patient will benefit from skilled therapeutic intervention in order to improve the following deficits and impairments:  Abnormal gait, Decreased activity tolerance, Decreased endurance, Decreased range of motion, Decreased strength, Pain, Obesity, Impaired flexibility, Difficulty walking, Decreased mobility  Visit Diagnosis: Bilateral calf pain  Difficulty in walking, not elsewhere classified  Muscle weakness (generalized)     Problem List Patient Active Problem List    Diagnosis Date Noted  . Infertility associated with anovulation 06/23/2019  . Major depression, single episode, in complete remission ([place]) 06/22/2019  . Postpartum hypertension 11/18/2016  . Gestational hypertension without significant proteinuria 11/01/2016  . Morbid obesity ([place]) 07/27/2016  . Hypothyroid 06/30/2016  . Elevated LFTs 05/27/2016  . HLD (hyperlipidemia)   . Migraines   . PCOS (polycystic ovarian syndrome)     Alanson Puls, PT DPT 09/27/2019, 6:01 PM  [place] MAIN [facility] SERVICES [address] [place], [place], [address] Phone: [phone]   Fax:  [phone]  Name: Cynthia George MRN: [id number] Date of Birth: July 23, 1985

## 2019-09-28 ENCOUNTER — Encounter: Payer: Self-pay | Admitting: Podiatry

## 2019-09-28 ENCOUNTER — Ambulatory Visit (INDEPENDENT_AMBULATORY_CARE_PROVIDER_SITE_OTHER): Payer: No Typology Code available for payment source | Admitting: Podiatry

## 2019-09-28 ENCOUNTER — Other Ambulatory Visit: Payer: Self-pay

## 2019-09-28 DIAGNOSIS — M7662 Achilles tendinitis, left leg: Secondary | ICD-10-CM

## 2019-09-28 DIAGNOSIS — M7661 Achilles tendinitis, right leg: Secondary | ICD-10-CM

## 2019-09-28 NOTE — Progress Notes (Signed)
   HPI: 34 y.o. female presenting today for follow up evaluation of bilateral foot pain.  Patient states she does not have significant improvement.  The pain is severe by the end of her work shift.  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  She says that the Medrol Dosepak that she took last visit provided little improvement.  She just ordered physical therapy yesterday at [facility] physical therapy.   Past Medical History:  Diagnosis Date  . Anxiety   . Depression   . Hyperlipidemia   . Migraines   . Obesity   . PCOS (polycystic ovarian syndrome)       Physical Exam: General: The patient is alert and oriented x3 in no acute distress.  Dermatology: Skin is warm, dry and supple bilateral lower extremities. Negative for open lesions or macerations.  Vascular: Palpable pedal pulses bilaterally. No edema or erythema noted. Capillary refill within normal limits.  Neurological: Epicritic and protective threshold grossly intact bilaterally.   Musculoskeletal Exam: Pain on palpation noted to the posterior tubercle of the bilateral calcaneus at the insertion of the Achilles tendon consistent with retrocalcaneal bursitis. Range of motion within normal limits. Muscle strength 5/5 in all muscle groups bilateral lower extremities.     Assessment: 1. Insertional Achilles tendinitis bilateral    Plan of Care:  1. Patient was evaluated.  2.  Injection of 0.5 cc Celestone Soluspan injected in the insertional area of the bilateral Achilles tendons.  Care was taken to avoid injection directly into the Achilles. 3.  Continue meloxicam daily 4.  Continue custom molded orthotics 5.  Continue physical therapy at [facility] physical therapy 6.  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   Felecia Shelling, DPM [facility]  Dr. Felecia Shelling, DPM    [address]                                        [place], [place] [address]                Office [phone]    Fax [phone]

## 2019-10-02 ENCOUNTER — Other Ambulatory Visit: Payer: Self-pay

## 2019-10-02 ENCOUNTER — Ambulatory Visit: Payer: No Typology Code available for payment source | Admitting: Physical Therapy

## 2019-10-02 ENCOUNTER — Encounter: Payer: Self-pay | Admitting: Physical Therapy

## 2019-10-02 DIAGNOSIS — R262 Difficulty in walking, not elsewhere classified: Secondary | ICD-10-CM

## 2019-10-02 DIAGNOSIS — M6281 Muscle weakness (generalized): Secondary | ICD-10-CM

## 2019-10-02 DIAGNOSIS — M79661 Pain in right lower leg: Secondary | ICD-10-CM

## 2019-10-02 DIAGNOSIS — M79662 Pain in left lower leg: Secondary | ICD-10-CM

## 2019-10-02 NOTE — Therapy (Signed)
[facility] PHYSICAL AND SPORTS MEDICINE [address] [place], [address] Phone: [phone]   Fax:  [phone]  Physical Therapy Treatment  Patient Details  Name: Cynthia George MRN: [id number] Date of Birth: [birth year] Referring Provider (PT): Cynthia George   Encounter Date: 10/02/2019   PT End of Session - 10/03/19 0940    Visit Number 2    Number of Visits 17    Date for PT Re-Evaluation 11/22/19    Authorization Type [facility] [employer] reporting period from 09/27/2019    Authorization Time Period must be coordinated care by patient    Authorization - Visit Number 2    Authorization - Number of Visits 10    Progress Note Due on Visit 10    PT Start Time 1820    PT Stop Time 1910    PT Time Calculation (min) 50 min    Equipment Utilized During Treatment --    Activity Tolerance Patient tolerated treatment well;Patient limited by pain    Behavior During Therapy [facility] for tasks assessed/performed           Past Medical History:  Diagnosis Date  . Anxiety   . Depression   . Hyperlipidemia   . Migraines   . Obesity   . PCOS (polycystic ovarian syndrome)     Past Surgical History:  Procedure Laterality Date  . APPENDECTOMY  2013  . HUMERUS FRACTURE SURGERY Left 2002,2003   rod and screws placed and removed  . TONSILLECTOMY AND ADENOIDECTOMY  2004  . WISDOM TOOTH EXTRACTION  2011   All    There were no vitals filed for this visit.   Subjective Assessment - 10/02/19 1824    Subjective Patient reports [name] at the achilles insertion tingling sensation at the . Burning and tingling. She thinks her pain started after running after a patient about a year ago. She also felt it after she was playing softball. Otherwise no specific reason that she can remember. She also states she has not paid close attention to its onset or specific pain behavior. She has quit softball and it his hard to play with her 33  year old son. Taking of work is not a good option for her. Cortisone shots have not been super helpful. Prednisone made it feel a little bit better but it came right back. The same response with 5 weeks in a CAM boot for the R side, although she could walk faster at work. Does have no pain sometimes when relaxing, but does suddenly occur at night.  Pain is worse at the end of the day after she has been standing. Sitting and calf stretch ease pain a bit, as does resting overnight..    Pertinent History Patient is a 34 y.o. female who presents to outpatient physical therapy with a referral for medical diagnosis left and right achilles tendinigis. 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  Relevant past medical history and comorbidities include morbid obesity, PCOS, HLD, hypothyroidism, migraines. Sx history is tonsils, two surgeries on left arm (humerus - fell as a teenager - no longer bothers her), wisdom teeth, appendectomy. Patient  denies hx of cancer, diabetes, hip, knee, back, or neck problems.    How long can you sit comfortably? no time limit    How long can you stand comfortably? more comfortable in the morning, unsure how long it takes to need to sit down    How long can you walk comfortably? more comfortable in the morning, unsure how long it takes to need to sit down    Diagnostic tests Radiographs reported 02/15/2019: "Radiographs: 3 views of skeletally mature adult foot: There is a posterior heel spur present bilaterally.  With small ossicle/osteophytes.  No plantar heel spur noted.  Accessory spurring noted at the base of the cuboid.  No  arthritic changes no other bony deformities noted."    Currently in Pain? Yes    Pain Score 8     Pain Location Heel    Pain Orientation Right;Left    Pain Descriptors / Indicators Burning;Tingling    Pain Radiating Towards radiates proximal to gastroc musculotendinous junction    Pain Onset More than a month ago    Pain Frequency --   unclear; seems to have pain free moments while resting in the morning   Aggravating Factors  standing, walking, weight bearing, running, end of day    Pain Relieving Factors morning, sitting, resting, Mild/temporary relief: CAM boot x 5 weeks, cortisone injection x2, prednisone dose pac    Effect of Pain on Daily Activities difficulty with any task requiring weight bearing including 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    Multiple Pain Sites No            OBJECTIVE  TTP at B achilles insertion and up to 5 CM proximal with diminishing tenderness (R>L), negative to Tinel testing at tarsal tunnel, edema noted surrounding hindfoot (R>L), dense and tight musculature noted in bilateral triceps surae (L >R).   TREATMENT:   Therapeutic exercise: to centralize symptoms and improve ROM, strength, muscular endurance, and activity tolerance required for successful completion of functional activities.  - subjective/objective exam to assess for appropriate intervention this date.   trial of tolerance for isometrics for each foot:  Standing - B feet x 15 seconds (R increase, no worse; L no effect) - L foot SLS x 45 seconds (increase, no worse) - B feet x 45 seconds (R increase, possibly slightly worse; L increase, no worse) Seated: - R foot against towel x 45 seconds (increase, no worse).   Standing isometrics; 3x45 seconds with 1 minute rest in between. Education for HEP and how to complete including importance of exact  timing for hold times and to complete in one about each day. Patient reported good understanding and demonstrated good ability to use the clock to regulate time spent resting and holding. L appeared to calm appropriately following, some elevation in discomfort on R following. Discussed options for HEP with patient and she felt she could do isometric double leg with tolerable discomfort. Discussed checking in 24 hours later and importance of no increase in overall pain when finding appropriate loading.   Education on HEP including handout   Manual therapy: to reduce pain and tissue tension, improve range of motion, neuromodulation, in order to promote improved ability to complete functional activities. - prone STM to bilateral triceps surae away from painful region of the tendon to decrease tightness and chronic load on tendon. Very tight  L > R with mild softening in response to manual.     HOME EXERCISE PROGRAM Isometric Heel raise: Hold with heels up 5x45 seconds once a day. 1 minute rest between holds.      PT Education - 10/03/19 0939    Education Details Exercise purpose/form. Self management techniques. Education on diagnosis, prognosis, POC, anatomy and physiology of current condition Education on HEP including handout    Person(s) Educated Patient    Methods Explanation;Demonstration;Tactile cues;Verbal cues;Handout    Comprehension Verbalized understanding;Returned demonstration;Verbal cues required;Tactile cues required;Need further instruction            PT Short Term Goals - 10/03/19 0942      PT SHORT TERM GOAL #1   Title Patient will be independent in home exercise program to improve strength/mobility for better functional independence with ADLs.    Baseline initial HEP provided at visit 2 (10/02/2019);    Time 4    Period Weeks    Status On-going    Target Date 10/25/19             PT Long Term Goals - 09/27/19 1755      PT LONG TERM GOAL #1   Title Patient will  increase LEFS score 8 points to demonstrate better functional mobility    Baseline need to do survey    Time 8    Period Weeks    Status New    Target Date 11/22/19      PT LONG TERM GOAL #2   Title Patient will report a worst pain of 3/10 on VAS in B ankles/feet to improve tolerance with ADLs and reduced symptoms with activities    Baseline 8/10 R foot, 3/10 left foot    Time 8    Period Weeks    Status New    Target Date 11/22/19      PT LONG TERM GOAL #3   Title Patient will increase BLE gross strength to 4+/5 as to improve functional strength for independent gait, increased standing tolerance and increased ADL ability.    Time 8    Period Weeks    Status New    Target Date 11/22/19      PT LONG TERM GOAL #4   Title Patent will be able to stand for 15 minutes with less than 5/10 pain to complete standing activities    Baseline 8/10 right foot, 3/10 left foot    Time 8    Period Weeks    Status New    Target Date 11/22/19                 Plan - 10/03/19 1116    Clinical Impression Statement patient tolerated treatment well overall appropriate response to session. Patient educated on the nature and needed loading for tendinopathy and initial trial of appropriate loading for HEP was initiated. Will re-assess next session and adjust as appropriate. Patient would benefit from continued management of limiting condition by skilled physical therapist to address remaining impairments and functional limitations to work towards stated goals and return to PLOF or maximal functional independence.    Personal Factors and Comorbidities Comorbidity 1    Comorbidities obesity    Examination-Activity Limitations Locomotion Level;Stairs;Sleep    Examination-Participation Restrictions Community Activity    Stability/Clinical Decision Making Stable/Uncomplicated    Rehab Potential Fair    PT Frequency 2x / week    PT Duration 8 weeks    PT Treatment/Interventions Cryotherapy;Electrical  Stimulation;Iontophoresis 4mg /ml Dexamethasone;Moist Heat;Ultrasound;Gait training;Stair  training;Functional mobility training;Therapeutic activities;Therapeutic exercise;Patient/family education;Manual techniques;Passive range of motion;Dry needling;Taping    PT Next Visit Plan scaled loading, manual therapy    PT Home Exercise Plan Isometric Heel raise: Hold with heels up 5x45 seconds once a day. 1 minute rest between holds.    Consulted and Agree with Plan of Care Patient           Patient will benefit from skilled therapeutic intervention in order to improve the following deficits and impairments:  Abnormal gait, Decreased activity tolerance, Decreased endurance, Decreased range of motion, Decreased strength, Pain, Obesity, Impaired flexibility, Difficulty walking, Decreased mobility  Visit Diagnosis: Bilateral calf pain  Difficulty in walking, not elsewhere classified  Muscle weakness (generalized)     Problem List Patient Active Problem List   Diagnosis Date Noted  . Infertility associated with anovulation 06/23/2019  . Major depression, single episode, in complete remission (HCC) 06/22/2019  . Postpartum hypertension 11/18/2016  . Gestational hypertension without significant proteinuria 11/01/2016  . Morbid obesity (HCC) 07/27/2016  . Hypothyroid 06/30/2016  . Elevated LFTs 05/27/2016  . HLD (hyperlipidemia)   . Migraines   . PCOS (polycystic ovarian syndrome)     Luretha Murphy. Ilsa Iha, PT, DPT 10/03/19, 11:17 AM  [facility] PHYSICAL AND SPORTS MEDICINE [address], [place], [address] Phone: [phone]   Fax:  [phone]  Name: Cynthia George MRN: [id number] Date of Birth: 25-Jan-1986

## 2019-10-03 ENCOUNTER — Ambulatory Visit: Payer: No Typology Code available for payment source | Admitting: Physical Therapy

## 2019-10-04 ENCOUNTER — Other Ambulatory Visit: Payer: Self-pay

## 2019-10-04 ENCOUNTER — Ambulatory Visit: Payer: No Typology Code available for payment source | Admitting: Physical Therapy

## 2019-10-04 ENCOUNTER — Encounter: Payer: Self-pay | Admitting: Physical Therapy

## 2019-10-04 DIAGNOSIS — M6281 Muscle weakness (generalized): Secondary | ICD-10-CM

## 2019-10-04 DIAGNOSIS — M79661 Pain in right lower leg: Secondary | ICD-10-CM

## 2019-10-04 DIAGNOSIS — R262 Difficulty in walking, not elsewhere classified: Secondary | ICD-10-CM

## 2019-10-04 NOTE — Therapy (Signed)
[facility]ORTS MEDIC[facility] [address], [place], [address] Phone: [phone]   Fax:  [phone]  Physical Therapy Treatment  Patient Details  Name: Cynthia George MRN: [id number] Date of Birth: May 11, 1985 Referring Provider (PT): Daylene Katayama   Encounter Date: 10/04/2019   PT End of Session - 10/04/19 1931    Visit Number 3    Number of Visits 17    Date for PT Re-Evaluation 11/22/19    Authorization Type [facility] [employer] reporting period from 09/27/2019    Authorization Time Period must be coordinated care by patient    Authorization - Visit Number 3    Authorization - Number of Visits 10    Progress Note Due on Visit 10    PT Start Time 1820    PT Stop Time 1900    PT Time Calculation (min) 40 min    Activity Tolerance Patient tolerated treatment well;No increased pain    Behavior During Therapy WFL for tasks assessed/performed           Past Medical History:  Diagnosis Date  . Anxiety   . Depression   . Hyperlipidemia   . Migraines   . Obesity   . PCOS (polycystic ovarian syndrome)     Past Surgical History:  Procedure Laterality Date  . APPENDECTOMY  2013  . HUMERUS FRACTURE SURGERY Left 2002,2003   rod and screws placed and removed  . TONSILLECTOMY AND ADENOIDECTOMY  2004  . WISDOM TOOTH EXTRACTION  2011   All    There were no vitals filed for this visit.   Subjective Assessment - 10/04/19 1825    Subjective Patient reports no pain upon arrival. States she had the day off and was really tired so she has not been on her feet much at all today. Worked yesteday and did her HEP in the evening wihtout causing overall increase in pain. States it is normal for her to not have as much pain on days she doesn't work.    Pertinent History Patient is a 34 y.o. female who presents to outpatient physical therapy with a referral for medical diagnosis left and right achilles tendinigis. 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  Relevant past medical history and comorbidities include morbid obesity, PCOS, HLD, hypothyroidism, migraines. Sx history is tonsils, two surgeries on left arm (humerus - fell as a teenager - no longer bothers her), wisdom teeth, appendectomy. Patient denies hx of cancer, diabetes, hip, knee, back, or neck problems.    How long can you sit comfortably? no time limit    How long can you stand comfortably? more comfortable in the morning, unsure how long it takes to need to sit down    How long can you walk comfortably? more comfortable in the morning, unsure how long it takes to need to sit down    Diagnostic tests Radiographs reported 02/15/2019: "Radiographs: 3 views of skeletally mature adult foot: There is a posterior heel spur present bilaterally.  With small ossicle/osteophytes.  No plantar heel spur noted.  Accessory spurring noted at the base of the cuboid.  No arthritic changes no other bony deformities noted."  Currently in Pain? No/denies    Pain Onset --            TREATMENT:   Therapeutic exercise:to centralize symptoms and improve ROM, strength, muscular endurance, and activity tolerance required for successful completion of functional activities.    Standing isometrics: Standing - B feet 5 x 45 seconds with 1 min rest between (R increase, possibly slightly worse; L increase, no worse) - education on how to use LAX or similar ball for self-mobilization for B triceps surae.  - Education on HEP including handout  - education on avoidance of calf stretching due to increase compressive force on  tendon insertion with dorsiflexion greater neutral. Advised against calf stretch or hanging heels off edge of step. Demonstrated mechanically how it could irritate the tendon using model. Patient was unclear how to do a calf stretch and clarified that what felt good was AROM of her ankles in unloaded position, not an actual calf stretch.   Manual therapy: to reduce pain and tissue tension, improve range of motion, neuromodulation, in order to promote improved ability to complete functional activities. - prone STM to bilateral triceps surae away from painful region of the tendon to decrease tightness and chronic load on tendon. Improved pliability of L this session. Tender R > L with manual therapy with notable increase in softness with manual therapy.   HOME EXERCISE PROGRAM Access Code: [email or web address] URL: [email or web address] Date: 10/04/2019 Prepared by: [name]  Exercises Isometric Heel Raise at Wall - 1 x daily - 5 reps - 45 seconds hold - 1 minute rest between reps Calf Mobilization with Small Ball - 5 minutes time spent     PT Education - 10/04/19 1931    Education Details Exercise purpose/form. Self management techniques.    Person(s) Educated Patient    Methods Explanation;Demonstration;Tactile cues;Verbal cues;Handout    Comprehension Verbalized understanding;Returned demonstration;Verbal cues required;Tactile cues required;Need further instruction            PT Short Term Goals - 10/03/19 0942      PT SHORT TERM GOAL #1   Title Patient will be independent in home exercise program to improve strength/mobility for better functional independence with ADLs.    Baseline initial HEP provided at visit 2 (10/02/2019);    Time 4    Period Weeks    Status On-going    Target Date 10/25/19             PT Long Term Goals - 09/27/19 1755      PT LONG TERM GOAL #1   Title Patient will increase LEFS score 8 points to demonstrate better functional mobility     Baseline need to do survey    Time 8    Period Weeks    Status New    Target Date 11/22/19      PT LONG TERM GOAL #2   Title Patient will report a worst pain of 3/10 on VAS in B ankles/feet to improve tolerance with ADLs and reduced symptoms with activities    Baseline 8/10 R foot, 3/10 left foot    Time 8    Period Weeks    Status New    Target Date 11/22/19      PT LONG TERM GOAL #3   Title Patient will increase BLE gross strength to 4+/5 as to improve functional strength for independent gait, increased standing tolerance and increased ADL ability.    Time 8    Period Weeks    Status New  Target Date 11/22/19      PT LONG TERM GOAL #4   Title Patent will be able to stand for 15 minutes with less than 5/10 pain to complete standing activities    Baseline 8/10 right foot, 3/10 left foot    Time 8    Period Weeks    Status New    Target Date 11/22/19                 Plan - 10/04/19 1841    Clinical Impression Statement Patient tolerated treatment well and her condition's pain behavior matches what is expected of tendinopathy: decreased pain with offloading. Tolerating initial HEP well at least on her day off. Plan to progress to single leg isometrics if she tolerates double leg well until next session. May introduce strengthening for intrinsic foot muscles and hip strengthening next session as appropriate. Educated patient on the essential importance of the loading HEP for her improvement.  Patient would benefit from continued management of limiting condition by skilled physical therapist to address remaining impairments and functional limitations to work towards stated goals and return to PLOF or maximal functional independence.    Personal Factors and Comorbidities Comorbidity 1    Comorbidities obesity    Examination-Activity Limitations Locomotion Level;Stairs;Sleep    Examination-Participation Restrictions Community Activity    Stability/Clinical Decision Making  Stable/Uncomplicated    Rehab Potential Fair    PT Frequency 2x / week    PT Duration 8 weeks    PT Treatment/Interventions Cryotherapy;Electrical Stimulation;Iontophoresis 4mg /ml Dexamethasone;Moist Heat;Ultrasound;Gait training;Stair training;Functional mobility training;Therapeutic activities;Therapeutic exercise;Patient/family education;Manual techniques;Passive range of motion;Dry needling;Taping    PT Next Visit Plan scaled loading, manual therapy    PT Home Exercise Plan Medbridge Access Code: [email or web address]    Consulted and Agree with Plan of Care Patient           Patient will benefit from skilled therapeutic intervention in order to improve the following deficits and impairments:  Abnormal gait, Decreased activity tolerance, Decreased endurance, Decreased range of motion, Decreased strength, Pain, Obesity, Impaired flexibility, Difficulty walking, Decreased mobility  Visit Diagnosis: Bilateral calf pain  Difficulty in walking, not elsewhere classified  Muscle weakness (generalized)     Problem List Patient Active Problem List   Diagnosis Date Noted  . Infertility associated with anovulation 06/23/2019  . Major depression, single episode, in complete remission (HCC) 06/22/2019  . Postpartum hypertension 11/18/2016  . Gestational hypertension without significant proteinuria 11/01/2016  . Morbid obesity (HCC) 07/27/2016  . Hypothyroid 06/30/2016  . Elevated LFTs 05/27/2016  . HLD (hyperlipidemia)   . Migraines   . PCOS (polycystic ovarian syndrome)     07/25/2016. Luretha Murphy, PT, DPT 10/04/19, 7:39 PM  [facility] PHYSICAL AND SPORTS MEDICINE [address], [place] Phone: [phone]   Fax:  [phone]  Name: Cynthia George MRN: [id number] Date of Birth: 1985-04-23

## 2019-10-08 ENCOUNTER — Encounter: Payer: No Typology Code available for payment source | Admitting: Physical Therapy

## 2019-10-09 ENCOUNTER — Ambulatory Visit: Payer: No Typology Code available for payment source | Admitting: Physical Therapy

## 2019-10-11 ENCOUNTER — Other Ambulatory Visit: Payer: Self-pay

## 2019-10-11 ENCOUNTER — Ambulatory Visit: Payer: No Typology Code available for payment source | Admitting: Physical Therapy

## 2019-10-11 ENCOUNTER — Encounter: Payer: Self-pay | Admitting: Physical Therapy

## 2019-10-11 DIAGNOSIS — R262 Difficulty in walking, not elsewhere classified: Secondary | ICD-10-CM

## 2019-10-11 DIAGNOSIS — M79661 Pain in right lower leg: Secondary | ICD-10-CM | POA: Diagnosis not present

## 2019-10-11 DIAGNOSIS — M79662 Pain in left lower leg: Secondary | ICD-10-CM

## 2019-10-11 DIAGNOSIS — M6281 Muscle weakness (generalized): Secondary | ICD-10-CM

## 2019-10-11 NOTE — Therapy (Signed)
[facility] [address], [place], [address] Phone: [phone]   Fax:  [phone]  Physical Therapy Treatment  Patient Details  Name: Cynthia George MRN: [id number] Date of Birth: [birth year] Referring Provider (PT): Daylene Katayama   Encounter Date: 10/11/2019   PT End of Session - 10/11/19 1837    Visit Number 4    Number of Visits 17    Date for PT Re-Evaluation 11/22/19    Authorization Type [employer] [facility] reporting period from 09/27/2019    Authorization Time Period must be coordinated care by patient    Authorization - Visit Number 4    Authorization - Number of Visits 10    Progress Note Due on Visit 10    PT Start Time 3151    PT Stop Time 1910    PT Time Calculation (min) 40 min    Activity Tolerance Patient tolerated treatment well;No increased pain    Behavior During Therapy WFL for tasks assessed/performed           Past Medical History:  Diagnosis Date  . Anxiety   . Depression   . Hyperlipidemia   . Migraines   . Obesity   . PCOS (polycystic ovarian syndrome)     Past Surgical History:  Procedure Laterality Date  . APPENDECTOMY  2013  . HUMERUS FRACTURE SURGERY Left 2002,2003   rod and screws placed and removed  . TONSILLECTOMY AND ADENOIDECTOMY  2004  . WISDOM TOOTH EXTRACTION  2011   All    There were no vitals filed for this visit.   Subjective Assessment - 10/11/19 1832    Subjective Patient reports she has no pain upon arrival. She states she has some normal after working pain but not as bad as before up to 4/10. Feels better aftwer work when she relaxed. HEP is going well and is tolerable. Feels like she is doing better than before she started PT.    Pertinent History Patient is a 34 y.o. female who presents to outpatient physical therapy with a referral for medical diagnosis left and right achilles tendinigis. 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  Relevant past medical history and comorbidities include morbid obesity, PCOS, HLD, hypothyroidism, migraines. Sx history is tonsils, two surgeries on left arm (humerus - fell as a teenager - no longer bothers her), wisdom teeth, appendectomy. Patient denies hx of cancer, diabetes, hip, knee, back, or neck problems.    How long can you sit comfortably? no time limit    How long can you stand comfortably? more comfortable in the morning, unsure how long it takes to need to sit down    How long can you walk comfortably? more comfortable in the morning, unsure how long it takes to need to sit down    Diagnostic tests Radiographs reported 02/15/2019: "Radiographs: 3 views of skeletally mature adult foot: There is a posterior heel spur present bilaterally.  With small ossicle/osteophytes.  No plantar heel spur noted.  Accessory spurring noted at the base of the cuboid.  No arthritic changes no other bony deformities noted."    Patient Stated Goals not sure  Currently in Pain? No/denies           TREATMENT:  Therapeutic exercise:to centralize symptoms and improve ROM, strength, muscular endurance, and activity tolerance required for successful completion of functional activities.   Standing isometrics: Standing - unilateral feet 5 x 45 seconds with 1 min rest between. Limited at first by pain at ball of foot, relieved with towel under foot.  - Education on HEP including handout Sitting:  - Toe Yoga: great toe extension with small toes flexion pressure into floor, repeated 3 sec holds; small toes extension with great toe flexion pressure into  floor, repeated 3 sec holds. Ball of foot and heel maintains contact with floor. To improve intrinsic foot muscle activation and strength in order to better support arch and intrinsic foot structures. x20/58min each side.  - Toe splays, repeated 3 sec holds. Ball of foot and heel maintains contact with floor. To improve intrinsic foot muscle activation and strength in order to better support arch and intrinsic foot structures.  x20/52min each side.   Manual therapy:to reduce pain and tissue tension, improve range of motion, neuromodulation, in order to promote improved ability to complete functional activities. - prone STM to bilateral triceps surae away from painful region of the tendon to decrease tightness and chronic load on tendon. Continued mproved pliability of L this session. Tighter R > L with manual therapy with notable increase in softness with manual therapy.   HOME EXERCISE PROGRAM Access Code: [email or web address] URL: [email or web address] Date: 10/11/2019 Prepared by: [name]  Exercises Isometric Heel Raise at Wall - 1 x daily - 5 reps - 45 seconds hold - 1 minute rest between reps Calf Mobilization with Small Ball - 5 minutes time spent Seated Great Toe Extension - 3 x weekly - 20 reps - 2 seconds hold Seated Lesser Toes Extension - 3 x weekly - 20 reps - 2 seconds hold Toe Spreading - 3 x weekly - 20 reps - 2 seconds hold     PT Education - 10/11/19 1837    Education Details Exercise purpose/form. Self management techniques.    Person(s) Educated Patient    Methods Explanation;Demonstration;Tactile cues;Verbal cues    Comprehension Verbalized understanding;Returned demonstration;Verbal cues required;Tactile cues required;Need further instruction            PT Short Term Goals - 10/03/19 0942      PT SHORT TERM GOAL #1   Title Patient will be independent in home exercise program to improve strength/mobility for better functional independence with ADLs.     Baseline initial HEP provided at visit 2 (10/02/2019);    Time 4    Period Weeks    Status On-going    Target Date 10/25/19             PT Long Term Goals - 09/27/19 1755      PT LONG TERM GOAL #1   Title Patient will increase LEFS score 8 points to demonstrate better functional mobility    Baseline need to do survey    Time 8    Period Weeks    Status New    Target Date 11/22/19      PT LONG TERM GOAL #2   Title Patient will report a worst pain of 3/10 on VAS in B ankles/feet to improve tolerance with ADLs and reduced symptoms with activities    Baseline 8/10 R foot, 3/10 left foot    Time 8    Period Weeks    Status New  Target Date 11/22/19      PT LONG TERM GOAL #3   Title Patient will increase BLE gross strength to 4+/5 as to improve functional strength for independent gait, increased standing tolerance and increased ADL ability.    Time 8    Period Weeks    Status New    Target Date 11/22/19      PT LONG TERM GOAL #4   Title Patent will be able to stand for 15 minutes with less than 5/10 pain to complete standing activities    Baseline 8/10 right foot, 3/10 left foot    Time 8    Period Weeks    Status New    Target Date 11/22/19                 Plan - 10/11/19 1942    Clinical Impression Statement Patient tolerated treatment well and is doing very well overall at this point. She was able to progress to single leg isometrics this session and reported improved comfort after manual therapy. Intrinsic foot exercises added to support patient's feet during long standing hours. Patient educated that achilles loading exercises are the core of her treatment and the most important that should have first priority for her HEP. Recommend patient decrease visit frequency to once a week at this point due to good response to treatment so far and lack of need of changing HEP as frequently as twice a week. Patient reports being in agreement with this. Patient would benefit  from continued management of limiting condition by skilled physical therapist to address remaining impairments and functional limitations to work towards stated goals and return to PLOF or maximal functional independence.    Personal Factors and Comorbidities Comorbidity 1    Comorbidities obesity    Examination-Activity Limitations Locomotion Level;Stairs;Sleep    Examination-Participation Restrictions Community Activity    Stability/Clinical Decision Making Stable/Uncomplicated    Rehab Potential Fair    PT Frequency 2x / week    PT Duration 8 weeks    PT Treatment/Interventions Cryotherapy;Electrical Stimulation;Iontophoresis 4mg /ml Dexamethasone;Moist Heat;Ultrasound;Gait training;Stair training;Functional mobility training;Therapeutic activities;Therapeutic exercise;Patient/family education;Manual techniques;Passive range of motion;Dry needling;Taping    PT Next Visit Plan scaled loading, manual therapy    PT Home Exercise Plan Medbridge Access Code: [email or web address]    Consulted and Agree with Plan of Care Patient           Patient will benefit from skilled therapeutic intervention in order to improve the following deficits and impairments:  Abnormal gait, Decreased activity tolerance, Decreased endurance, Decreased range of motion, Decreased strength, Pain, Obesity, Impaired flexibility, Difficulty walking, Decreased mobility  Visit Diagnosis: Bilateral calf pain  Difficulty in walking, not elsewhere classified  Muscle weakness (generalized)     Problem List Patient Active Problem List   Diagnosis Date Noted  . Infertility associated with anovulation 06/23/2019  . Major depression, single episode, in complete remission (HCC) 06/22/2019  . Postpartum hypertension 11/18/2016  . Gestational hypertension without significant proteinuria 11/01/2016  . Morbid obesity (HCC) 07/27/2016  . Hypothyroid 06/30/2016  . Elevated LFTs 05/27/2016  . HLD (hyperlipidemia)   . Migraines   . PCOS  (polycystic ovarian syndrome)    07/25/2016. Luretha Murphy, PT, DPT 10/11/19, 7:42 PM  [facility] [address], [place] Phone: [phone]   Fax:  [phone]  Name: Cynthia George MRN: Nyoka Lint Date of Birth: 12-04-1985

## 2019-10-16 ENCOUNTER — Encounter: Payer: No Typology Code available for payment source | Admitting: Physical Therapy

## 2019-10-16 ENCOUNTER — Ambulatory Visit: Payer: No Typology Code available for payment source | Admitting: Physical Therapy

## 2019-10-18 ENCOUNTER — Other Ambulatory Visit: Payer: Self-pay

## 2019-10-18 ENCOUNTER — Ambulatory Visit: Payer: No Typology Code available for payment source | Attending: Podiatry | Admitting: Physical Therapy

## 2019-10-18 ENCOUNTER — Encounter: Payer: Self-pay | Admitting: Physical Therapy

## 2019-10-18 DIAGNOSIS — R262 Difficulty in walking, not elsewhere classified: Secondary | ICD-10-CM | POA: Diagnosis present

## 2019-10-18 DIAGNOSIS — M6281 Muscle weakness (generalized): Secondary | ICD-10-CM | POA: Diagnosis present

## 2019-10-18 DIAGNOSIS — M79661 Pain in right lower leg: Secondary | ICD-10-CM | POA: Diagnosis not present

## 2019-10-18 DIAGNOSIS — M79662 Pain in left lower leg: Secondary | ICD-10-CM | POA: Diagnosis present

## 2019-10-18 NOTE — Therapy (Signed)
[facility] [address], [place], [address] Phone: [phone]   Fax:  [phone]  Physical Therapy Treatment  Patient Details  Name: Cynthia George MRN: [id number] Date of Birth: [birth year] Referring Provider (PT): Gala Lewandowsky   Encounter Date: 10/18/2019   PT End of Session - 10/18/19 1837    Visit Number 5    Number of Visits 17    Date for PT Re-Evaluation 11/22/19    Authorization Type [facility] [employer] reporting period from 09/27/2019    Authorization Time Period must be coordinated care by patient    Authorization - Visit Number 5    Authorization - Number of Visits 10    Progress Note Due on Visit 10    PT Start Time 1830    PT Stop Time 1910    PT Time Calculation (min) 40 min    Activity Tolerance Patient tolerated treatment well;No increased pain    Behavior During Therapy WFL for tasks assessed/performed           Past Medical History:  Diagnosis Date  . Anxiety   . Depression   . Hyperlipidemia   . Migraines   . Obesity   . PCOS (polycystic ovarian syndrome)     Past Surgical History:  Procedure Laterality Date  . APPENDECTOMY  2013  . HUMERUS FRACTURE SURGERY Left 2002,2003   rod and screws placed and removed  . TONSILLECTOMY AND ADENOIDECTOMY  2004  . WISDOM TOOTH EXTRACTION  2011   All    There were no vitals filed for this visit.   Subjective Assessment - 10/18/19 1830    Subjective Patient reports she has no pain upon arrival. She did have a bit of pain in her right lower leg yesterday but it wasn't bad. She has been doing her isometric HEP daily and finds the toe exercises challenging.    Pertinent History Patient is a 34 y.o. female who presents to outpatient physical therapy with a referral for medical diagnosis left and right achilles tendinigis. 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  Relevant past medical history and comorbidities include morbid obesity, PCOS, HLD, hypothyroidism, migraines. Sx history is tonsils, two surgeries on left arm (humerus - fell as a teenager - no longer bothers her), wisdom teeth, appendectomy. Patient denies hx of cancer, diabetes, hip, knee, back, or neck problems.    How long can you sit comfortably? no time limit    How long can you stand comfortably? more comfortable in the morning, unsure how long it takes to need to sit down    How long can you walk comfortably? more comfortable in the morning, unsure how long it takes to need to sit down    Diagnostic tests Radiographs reported 02/15/2019: "Radiographs: 3 views of skeletally mature adult foot: There is a posterior heel spur present bilaterally.  With small ossicle/osteophytes.  No plantar heel spur noted.  Accessory spurring noted at the base of the cuboid.  No arthritic changes no other bony deformities noted."    Patient Stated Goals not sure    Currently in Pain? No/denies  TREATMENT:  Therapeutic exercise:to centralize symptoms and improve ROM, strength, muscular endurance, and activity tolerance required for successful completion of functional activities.  Standing:  - B slow controlled heel raises, 3x15 with one minute rest between (well tolerated).  - R slow controlled SLS heel raise, x 15 (uncomfortable by end), L slow controlled SLS heel raise, x 15 (tingling in posterior thigh by end).  - Education on HEP including handout updated to include 3x15 slow double leg heel raises with 1 min rest between sets, 3x a week and single leg isometric heel raises 5x 45  secondswith 1 min rest between, 3 x a week.    Manual therapy:to reduce pain and tissue tension, improve range of motion, neuromodulation, in order to promote improved ability to complete functional activities. - prone STM to bilateral triceps surae away from painful region of the tendon to decrease tightness and chronic load on tendon.Continued improved pliability of L this session. Tighter R > L with manual therapy with notable increase in softness with manual therapy.   HOME EXERCISE PROGRAM Access Code: [email or web address] URL: [email or web address] Date: 10/18/2019 Prepared by: [name]  Exercises Isometric Heel Raise at Wall - 4 x weekly - 5 reps - 45 seconds hold - 1 minute rest between reps Standing Heel Raises - 3 x weekly - 3 sets - 15 reps - 8 seconds time for each rep - 1 minute rest between sets Calf Mobilization with Small Ball - 5 minutes time spent Seated Great Toe Extension - 3 x weekly - 20 reps - 2 seconds hold Seated Lesser Toes Extension - 3 x weekly - 20 reps - 2 seconds hold Toe Spreading - 3 x weekly - 20 reps - 2 seconds hold    PT Education - 10/18/19 1837    Education Details Exercise purpose/form. Self management techniques.    Person(s) Educated Patient    Methods Explanation;Demonstration;Verbal cues    Comprehension Verbalized understanding;Returned demonstration;Verbal cues required;Need further instruction            PT Short Term Goals - 10/03/19 0942      PT SHORT TERM GOAL #1   Title Patient will be independent in home exercise program to improve strength/mobility for better functional independence with ADLs.    Baseline initial HEP provided at visit 2 (10/02/2019);    Time 4    Period Weeks    Status On-going    Target Date 10/25/19             PT Long Term Goals - 09/27/19 1755      PT LONG TERM GOAL #1   Title Patient will increase LEFS score 8 points to demonstrate better functional mobility    Baseline need to do  survey    Time 8    Period Weeks    Status New    Target Date 11/22/19      PT LONG TERM GOAL #2   Title Patient will report a worst pain of 3/10 on VAS in B ankles/feet to improve tolerance with ADLs and reduced symptoms with activities    Baseline 8/10 R foot, 3/10 left foot    Time 8    Period Weeks    Status New    Target Date 11/22/19      PT LONG TERM GOAL #3   Title Patient will increase BLE gross strength to 4+/5 as to improve functional strength for independent gait, increased standing tolerance and increased ADL ability.    Time 8  Period Weeks    Status New    Target Date 11/22/19      PT LONG TERM GOAL #4   Title Patent will be able to stand for 15 minutes with less than 5/10 pain to complete standing activities    Baseline 8/10 right foot, 3/10 left foot    Time 8    Period Weeks    Status New    Target Date 11/22/19                 Plan - 10/18/19 1928    Clinical Impression Statement Patient tolerated treatment well and was able to move to the transitional isometric/dynamic heel raise phase of rehab. Almost advanced to single leg dynamic heel raise but did not due to discomfort. Plan to advance to this stage next session if appropriate. Continues to report improvement in pain and function overall and has good HEP participation. Patient would benefit from continued management of limiting condition by skilled physical therapist to address remaining impairments and functional limitations to work towards stated goals and return to PLOF or maximal functional independence.    Personal Factors and Comorbidities Comorbidity 1    Comorbidities obesity    Examination-Activity Limitations Locomotion Level;Stairs;Sleep    Examination-Participation Restrictions Community Activity    Stability/Clinical Decision Making Stable/Uncomplicated    Rehab Potential Fair    PT Frequency 2x / week    PT Duration 8 weeks    PT Treatment/Interventions Cryotherapy;Electrical  Stimulation;Iontophoresis 4mg /ml Dexamethasone;Moist Heat;Ultrasound;Gait training;Stair training;Functional mobility training;Therapeutic activities;Therapeutic exercise;Patient/family education;Manual techniques;Passive range of motion;Dry needling;Taping    PT Next Visit Plan scaled loading, manual therapy    PT Home Exercise Plan Medbridge Access Code: [email or web address]    Consulted and Agree with Plan of Care Patient           Patient will benefit from skilled therapeutic intervention in order to improve the following deficits and impairments:  Abnormal gait, Decreased activity tolerance, Decreased endurance, Decreased range of motion, Decreased strength, Pain, Obesity, Impaired flexibility, Difficulty walking, Decreased mobility  Visit Diagnosis: Bilateral calf pain  Difficulty in walking, not elsewhere classified  Muscle weakness (generalized)     Problem List Patient Active Problem List   Diagnosis Date Noted  . Infertility associated with anovulation 06/23/2019  . Major depression, single episode, in complete remission (HCC) 06/22/2019  . Postpartum hypertension 11/18/2016  . Gestational hypertension without significant proteinuria 11/01/2016  . Morbid obesity (HCC) 07/27/2016  . Hypothyroid 06/30/2016  . Elevated LFTs 05/27/2016  . HLD (hyperlipidemia)   . Migraines   . PCOS (polycystic ovarian syndrome)     07/25/2016. Luretha Murphy, PT, DPT 10/18/19, 7:29 PM  [facility] [address], [place] Phone: [phone]   Fax:  [phone]  Name: MADY OUBRE MRN: Nyoka Lint Date of Birth: 1985/08/13

## 2019-10-23 ENCOUNTER — Ambulatory Visit: Payer: No Typology Code available for payment source | Admitting: Physical Therapy

## 2019-10-24 ENCOUNTER — Other Ambulatory Visit: Payer: Self-pay

## 2019-10-24 ENCOUNTER — Ambulatory Visit: Payer: No Typology Code available for payment source | Admitting: Physical Therapy

## 2019-10-24 ENCOUNTER — Encounter: Payer: Self-pay | Admitting: Physical Therapy

## 2019-10-24 DIAGNOSIS — M79661 Pain in right lower leg: Secondary | ICD-10-CM | POA: Diagnosis not present

## 2019-10-24 DIAGNOSIS — R262 Difficulty in walking, not elsewhere classified: Secondary | ICD-10-CM

## 2019-10-24 DIAGNOSIS — M79662 Pain in left lower leg: Secondary | ICD-10-CM

## 2019-10-24 DIAGNOSIS — M6281 Muscle weakness (generalized): Secondary | ICD-10-CM

## 2019-10-24 NOTE — Therapy (Signed)
[facility] PH[facility] MEDICINE [address] [place], [address] Phone: [phone]   Fax:  [phone]  Physical Therapy Treatment  Patient Details  Name: Cynthia George MRN: [id number] Date of Birth: 1985-09-13 Referring Provider (PT): Gala Lewandowsky   Encounter Date: 10/24/2019   PT End of Session - 10/25/19 1323    Visit Number 6    Number of Visits 17    Date for PT Re-Evaluation 11/22/19    Authorization Type [facility] [employer] reporting period from 09/27/2019    Authorization Time Period must be coordinated care by patient    Authorization - Visit Number 6    Authorization - Number of Visits 10    Progress Note Due on Visit 10    PT Start Time 1845    PT Stop Time 1925    PT Time Calculation (min) 40 min    Activity Tolerance Patient tolerated treatment well;No increased pain    Behavior During Therapy WFL for tasks assessed/performed           Past Medical History:  Diagnosis Date  . Anxiety   . Depression   . Hyperlipidemia   . Migraines   . Obesity   . PCOS (polycystic ovarian syndrome)     Past Surgical History:  Procedure Laterality Date  . APPENDECTOMY  2013  . HUMERUS FRACTURE SURGERY Left 2002,2003   rod and screws placed and removed  . TONSILLECTOMY AND ADENOIDECTOMY  2004  . WISDOM TOOTH EXTRACTION  2011   All    There were no vitals filed for this visit.   Subjective Assessment - 10/24/19 1847    Subjective Patient reports she is feeling well. She has no pain today. She had a lot of pain yesteday at work where it was extremely busy all day. She sat down and took a rest when needed and was able to manage it. She has been having difficulty getting through all 15 reps with single leg heel raises.    Pertinent History Patient is a 34 y.o. female who presents to outpatient physical therapy with a referral for medical diagnosis left and right achilles tendinigis. 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  Relevant past medical history and comorbidities include morbid obesity, PCOS, HLD, hypothyroidism, migraines. Sx history is tonsils, two surgeries on left arm (humerus - fell as a teenager - no longer bothers her), wisdom teeth, appendectomy. Patient denies hx of cancer, diabetes, hip, knee, back, or neck problems.    How long can you sit comfortably? no time limit    How long can you stand comfortably? more comfortable in the morning, unsure how long it takes to need to sit down    How long can you walk comfortably? more comfortable in the morning, unsure how long it takes to need to sit down    Diagnostic tests Radiographs reported 02/15/2019: "Radiographs: 3 views of skeletally mature adult foot: There is a posterior heel spur present bilaterally.  With small ossicle/osteophytes.  No plantar heel spur noted.  Accessory spurring noted at the base of the cuboid.  No arthritic changes no other bony deformities noted."  Patient Stated Goals not sure    Currently in Pain? No/denies             TREATMENT:  Therapeutic exercise:to centralize symptoms and improve ROM, strength, muscular endurance, and activity tolerance required for successful completion of functional activities.  Standing:  - single leg slow controlled heel raises, 3x15 each side with at least one minute rest between (well tolerated).  - ball toss at rebounder with SLS using 2kg med ball, 3x10 each side with several warm up tosses as well.  - Side stepping with red theraband wrapped around legs to activate core and hip stabilizers and abductors in  functional weightbearing position. X 20 feet each direction with red band under feet and crossed in front of body, x20 feet way with band under feet and wrapped twice around body, x 20 feet each way with red theraband wrapped twice around ankles (feels in hips now).   - Education on HEP including handout updated to include 3x15 slow single leg heel raises with 1 min rest between sets, 3x a week and single leg isometric heel raises 5x 45 secondswith 1 min rest between, 3 x a week.    Manual therapy:to reduce pain and tissue tension, improve range of motion, neuromodulation, in order to promote improved ability to complete functional activities. - prone STM to bilateral triceps surae away from painful region of the tendon to decrease tightness and chronic load on tendon.Continuedimproved pliability of L this session.TighterR > L with manual therapy with notable increase in softness with manual therapy.   HOME EXERCISE PROGRAM Access Code: [email or web address] URL: [email or web address] Date: 10/18/2019 Prepared by: [name]  Exercises Isometric Heel Raise at Wall - 4 x weekly - 5 reps - 45 seconds hold - 1 minute rest between reps Standing Heel Raises - 3 x weekly - 3 sets - 15 reps - 8 seconds time for each rep - 1 minute rest between sets Calf Mobilization with Small Ball - 5 minutes time spent Seated Great Toe Extension - 3 x weekly - 20 reps - 2 seconds hold Seated Lesser Toes Extension - 3 x weekly - 20 reps - 2 seconds hold Toe Spreading - 3 x weekly - 20 reps - 2 seconds hold    PT Education - 10/25/19 1323    Education Details Exercise purpose/form. Self management techniques    Person(s) Educated Patient    Methods Explanation;Demonstration;Verbal cues    Comprehension Verbalized understanding;Returned demonstration;Verbal cues required;Need further instruction            PT Short Term Goals - 10/03/19 0942      PT SHORT TERM GOAL #1   Title Patient will  be independent in home exercise program to improve strength/mobility for better functional independence with ADLs.    Baseline initial HEP provided at visit 2 (10/02/2019);    Time 4    Period Weeks    Status On-going    Target Date 10/25/19             PT Long Term Goals - 09/27/19 1755      PT LONG TERM GOAL #1   Title Patient will increase LEFS score 8 points to demonstrate better functional mobility    Baseline need to do survey    Time 8    Period Weeks    Status New    Target Date 11/22/19      PT LONG TERM GOAL #2   Title Patient will report a worst pain of 3/10  on VAS in B ankles/feet to improve tolerance with ADLs and reduced symptoms with activities    Baseline 8/10 R foot, 3/10 left foot    Time 8    Period Weeks    Status New    Target Date 11/22/19      PT LONG TERM GOAL #3   Title Patient will increase BLE gross strength to 4+/5 as to improve functional strength for independent gait, increased standing tolerance and increased ADL ability.    Time 8    Period Weeks    Status New    Target Date 11/22/19      PT LONG TERM GOAL #4   Title Patent will be able to stand for 15 minutes with less than 5/10 pain to complete standing activities    Baseline 8/10 right foot, 3/10 left foot    Time 8    Period Weeks    Status New    Target Date 11/22/19                 Plan - 10/25/19 1331    Clinical Impression Statement Patient tolerated treatment well and continues to make progress towards goals. patient had attempted to complete more difficult exercise last week than intended with acceptable tolerance. Further educated on using pain after rest as guide whether to stop exercise vs fatigue/discomfort during exercise as a guide. Patient would benefit from continued management of limiting condition by skilled physical therapist to address remaining impairments and functional limitations to work towards stated goals and return to PLOF or maximal functional  independence.    Personal Factors and Comorbidities Comorbidity 1    Comorbidities obesity    Examination-Activity Limitations Locomotion Level;Stairs;Sleep    Examination-Participation Restrictions Community Activity    Stability/Clinical Decision Making Stable/Uncomplicated    Rehab Potential Fair    PT Frequency 2x / week    PT Duration 8 weeks    PT Treatment/Interventions Cryotherapy;Electrical Stimulation;Iontophoresis 4mg /ml Dexamethasone;Moist Heat;Ultrasound;Gait training;Stair training;Functional mobility training;Therapeutic activities;Therapeutic exercise;Patient/family education;Manual techniques;Passive range of motion;Dry needling;Taping    PT Next Visit Plan scaled loading, manual therapy    PT Home Exercise Plan Medbridge Access Code: [email or web address]    Consulted and Agree with Plan of Care Patient           Patient will benefit from skilled therapeutic intervention in order to improve the following deficits and impairments:  Abnormal gait, Decreased activity tolerance, Decreased endurance, Decreased range of motion, Decreased strength, Pain, Obesity, Impaired flexibility, Difficulty walking, Decreased mobility  Visit Diagnosis: Bilateral calf pain  Difficulty in walking, not elsewhere classified  Muscle weakness (generalized)     Problem List Patient Active Problem List   Diagnosis Date Noted  . Infertility associated with anovulation 06/23/2019  . Major depression, single episode, in complete remission (HCC) 06/22/2019  . Postpartum hypertension 11/18/2016  . Gestational hypertension without significant proteinuria 11/01/2016  . Morbid obesity (HCC) 07/27/2016  . Hypothyroid 06/30/2016  . Elevated LFTs 05/27/2016  . HLD (hyperlipidemia)   . Migraines   . PCOS (polycystic ovarian syndrome)     07/25/2016. Luretha Murphy, PT, DPT 10/25/19, 1:32 PM  [facility] PHYSICAL AND SPORTS MEDICINE [address],  [place] Phone: [phone]   Fax:  [phone]  Name: Cynthia George MRN: Nyoka Lint Date of Birth: 1985/10/24

## 2019-10-25 ENCOUNTER — Encounter: Payer: No Typology Code available for payment source | Admitting: Physical Therapy

## 2019-10-29 ENCOUNTER — Encounter: Payer: No Typology Code available for payment source | Admitting: Physical Therapy

## 2019-10-31 ENCOUNTER — Other Ambulatory Visit: Payer: Self-pay

## 2019-10-31 ENCOUNTER — Encounter: Payer: Self-pay | Admitting: Physical Therapy

## 2019-10-31 ENCOUNTER — Ambulatory Visit: Payer: No Typology Code available for payment source | Admitting: Physical Therapy

## 2019-10-31 DIAGNOSIS — M79661 Pain in right lower leg: Secondary | ICD-10-CM | POA: Diagnosis not present

## 2019-10-31 DIAGNOSIS — R262 Difficulty in walking, not elsewhere classified: Secondary | ICD-10-CM

## 2019-10-31 DIAGNOSIS — M6281 Muscle weakness (generalized): Secondary | ICD-10-CM

## 2019-10-31 NOTE — Therapy (Signed)
[facility] PHYSICAL AND [facility]ORTS MEDICINE [address] [place], [place], [address] Phone: [phone]   Fax:  [phone]  Physical Therapy Treatment  Patient Details  Name: Cynthia George MRN: [id number] Date of Birth: [birth year] Referring Provider (PT): Gala LewandowskyEvans Brent   Encounter Date: 10/31/2019   PT End of Session - 11/01/19 1615    Visit Number 7    Number of Visits 17    Date for PT Re-Evaluation 11/22/19    Authorization Type [facility] [employer] reporting period from 09/27/2019    Authorization Time Period must be coordinated care by patient    Authorization - Visit Number 7    Authorization - Number of Visits 10    Progress Note Due on Visit 10    PT Start Time 1835    PT Stop Time 1915    PT Time Calculation (min) 40 min    Activity Tolerance Patient tolerated treatment well;No increased pain    Behavior During Therapy WFL for tasks assessed/performed           Past Medical History:  Diagnosis Date  . Anxiety   . Depression   . Hyperlipidemia   . Migraines   . Obesity   . PCOS (polycystic ovarian syndrome)     Past Surgical History:  Procedure Laterality Date  . APPENDECTOMY  2013  . HUMERUS FRACTURE SURGERY Left 2002,2003   rod and screws placed and removed  . TONSILLECTOMY AND ADENOIDECTOMY  2004  . WISDOM TOOTH EXTRACTION  2011   All    There were no vitals filed for this visit.   Subjective Assessment - 11/01/19 1613    Subjective Patient reports pain of 3/10 upon arrival today mostly in the R lateral posterior lower leg. She just finished her 3rd shift of three days in a row. State she had some pain yesterday during work but went ahead and did her heel raises yesterday evening after work. She awoke with a bit of stiffness. Had some managable discomfort during work today.    Pertinent History Patient is a 34 y.o. female who presents to outpatient physical therapy with a referral for medical diagnosis left and right  achilles tendinigis. 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  Relevant past medical history and comorbidities include morbid obesity, PCOS, HLD, hypothyroidism, migraines. Sx history is tonsils, two surgeries on left arm (humerus - fell as a teenager - no longer bothers her), wisdom teeth, appendectomy. Patient denies hx of cancer, diabetes, hip, knee, back, or neck problems.    How long can you sit comfortably? no time limit    How long can you stand comfortably? more comfortable in the morning, unsure how long it takes to need to sit down    How long can you walk comfortably? more comfortable in the morning, unsure how long it takes to need to sit down    Diagnostic tests Radiographs reported 02/15/2019: "Radiographs: 3 views of skeletally mature adult foot: There is a posterior heel spur present bilaterally.  With small ossicle/osteophytes.  No plantar heel spur noted.  Accessory spurring noted at the base of the cuboid.  No arthritic changes no other bony  deformities noted."    Patient Stated Goals not sure    Currently in Pain? Yes    Pain Score 3            TREATMENT:  Therapeutic exercise:to centralize symptoms and improve ROM, strength, muscular endurance, and activity tolerance required for successful completion of functional activities.  Manual therapy:to reduce pain and tissue tension, improve range of motion, neuromodulation, in order to promote improved ability to complete functional activities. - prone STM to bilateral triceps surae away from painful region of the tendon to decrease tightness and chronic load on  tendon. Softening of tissue noted with STM.   Assessment for low back contributor to pain:  - Lumbar AROM WFL with no reproduction of foot pain with overpressure in flexion, extension, side-bending, or rotation except tingling in bilateral calves with end range flexion.  - no reproduction of leg or ankle pain with CPA grade IV to lumbar spine.  - no effect on feet, ankles, legs symptoms following lumbar extension in lying x 5 + lumbar extension in lying with self overpressure x 7 (lock and sag).  (Lumbar spine does not appear to be contributing any to calf/heel pain).   Standing:   - Side stepping with red theraband wrapped around legs to activate core and hip stabilizers and abductors in functional weightbearing position, 1x 30 feet each way with red theraband wrapped twice around ankles (feels in hips strongly).  - ball toss at rebounder with SLS using 2kg med ball, 3x10 each side with several warm up tosses as well.  - lateral ball toss with 2kg ball at rebounder while feet are in tandem stance on airex (complient surface).   -Education on HEP including review of exercises including 3x15 slow single leg heel raises with 1 min rest between sets, 3x a week and single leg isometric heel raises5x 45 secondswith 1 min rest between, 3 x a week.  HOME EXERCISE PROGRAM Access Code: [email or web address] URL: [email or web address] Date: 10/18/2019 Prepared by: [name]  Exercises Isometric Heel Raise at Wall - 4 x weekly - 5 reps - 45 seconds hold - 1 minute rest between reps Standing Heel Raises - 3 x weekly - 3 sets - 15 reps - 8 seconds time for each rep - 1 minute rest between sets Calf Mobilization with Small Ball - 5 minutes time spent Seated Great Toe Extension - 3 x weekly - 20 reps - 2 seconds hold Seated Lesser Toes Extension - 3 x weekly - 20 reps - 2 seconds hold Toe Spreading - 3 x weekly - 20 reps - 2 seconds hold    PT Education - 11/01/19 1615    Education  Details Exercise purpose/form. Self management techniques    Person(s) Educated Patient    Methods Explanation;Demonstration;Tactile cues;Verbal cues    Comprehension Verbalized understanding;Returned demonstration;Verbal cues required;Tactile cues required;Need further instruction            PT Short Term Goals - 10/03/19 0942      PT SHORT TERM GOAL #1   Title Patient will be independent in home exercise program to improve strength/mobility for better functional independence with ADLs.    Baseline initial HEP provided at visit 2 (10/02/2019);    Time 4    Period Weeks    Status On-going    Target Date 10/25/19             PT Long Term Goals - 09/27/19 1755      PT LONG TERM GOAL #1  Title Patient will increase LEFS score 8 points to demonstrate better functional mobility    Baseline need to do survey    Time 8    Period Weeks    Status New    Target Date 11/22/19      PT LONG TERM GOAL #2   Title Patient will report a worst pain of 3/10 on VAS in B ankles/feet to improve tolerance with ADLs and reduced symptoms with activities    Baseline 8/10 R foot, 3/10 left foot    Time 8    Period Weeks    Status New    Target Date 11/22/19      PT LONG TERM GOAL #3   Title Patient will increase BLE gross strength to 4+/5 as to improve functional strength for independent gait, increased standing tolerance and increased ADL ability.    Time 8    Period Weeks    Status New    Target Date 11/22/19      PT LONG TERM GOAL #4   Title Patent will be able to stand for 15 minutes with less than 5/10 pain to complete standing activities    Baseline 8/10 right foot, 3/10 left foot    Time 8    Period Weeks    Status New    Target Date 11/22/19                 Plan - 11/01/19 1623    Clinical Impression Statement Patient tolerated treatment well overall and reported reduction in pain by end of session. Did not complete heel raises this date due to increased symptoms and  that today is an "off" day for her every other day program. Patient would benefit from continued management of limiting condition by skilled physical therapist to address remaining impairments and functional limitations to work towards stated goals and return to PLOF or maximal functional independence.    Personal Factors and Comorbidities Comorbidity 1    Comorbidities obesity    Examination-Activity Limitations Locomotion Level;Stairs;Sleep    Examination-Participation Restrictions Community Activity    Stability/Clinical Decision Making Stable/Uncomplicated    Rehab Potential Fair    PT Frequency 2x / week    PT Duration 8 weeks    PT Treatment/Interventions Cryotherapy;Electrical Stimulation;Iontophoresis 4mg /ml Dexamethasone;Moist Heat;Ultrasound;Gait training;Stair training;Functional mobility training;Therapeutic activities;Therapeutic exercise;Patient/family education;Manual techniques;Passive range of motion;Dry needling;Taping    PT Next Visit Plan scaled loading, manual therapy    PT Home Exercise Plan Medbridge Access Code: [email or web address]    Consulted and Agree with Plan of Care Patient           Patient will benefit from skilled therapeutic intervention in order to improve the following deficits and impairments:  Abnormal gait, Decreased activity tolerance, Decreased endurance, Decreased range of motion, Decreased strength, Pain, Obesity, Impaired flexibility, Difficulty walking, Decreased mobility  Visit Diagnosis: Bilateral calf pain  Difficulty in walking, not elsewhere classified  Muscle weakness (generalized)     Problem List Patient Active Problem List   Diagnosis Date Noted  . Infertility associated with anovulation 06/23/2019  . Major depression, single episode, in complete remission (HCC) 06/22/2019  . Postpartum hypertension 11/18/2016  . Gestational hypertension without significant proteinuria 11/01/2016  . Morbid obesity (HCC) 07/27/2016  . Hypothyroid  06/30/2016  . Elevated LFTs 05/27/2016  . HLD (hyperlipidemia)   . Migraines   . PCOS (polycystic ovarian syndrome)     07/25/2016. Luretha Murphy, PT, DPT 11/01/19, 4:24 PM  [facility] PHYSICAL AND  SPORTS MEDICINE [address], [place], [address] Phone: [phone]   Fax:  [phone]  Name: Cynthia George MRN: [id number] Date of Birth: [birth year]

## 2019-11-02 ENCOUNTER — Ambulatory Visit (INDEPENDENT_AMBULATORY_CARE_PROVIDER_SITE_OTHER): Payer: No Typology Code available for payment source | Admitting: Podiatry

## 2019-11-02 ENCOUNTER — Encounter: Payer: Self-pay | Admitting: Podiatry

## 2019-11-02 ENCOUNTER — Other Ambulatory Visit: Payer: Self-pay

## 2019-11-02 DIAGNOSIS — M79672 Pain in left foot: Secondary | ICD-10-CM

## 2019-11-02 DIAGNOSIS — M7662 Achilles tendinitis, left leg: Secondary | ICD-10-CM | POA: Diagnosis not present

## 2019-11-02 DIAGNOSIS — M79671 Pain in right foot: Secondary | ICD-10-CM | POA: Diagnosis not present

## 2019-11-02 DIAGNOSIS — M7661 Achilles tendinitis, right leg: Secondary | ICD-10-CM

## 2019-11-02 NOTE — Progress Notes (Signed)
° °  HPI: 34 y.o. female presenting today for follow-up evaluation regarding bilateral Achilles tendinitis.  Patient states that she is 80-90% better.  She states that physical therapy and the anti-inflammatory injections helped significantly.  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  No new complaints at this time  Past Medical History:  Diagnosis Date   Anxiety    Depression    Hyperlipidemia    Migraines    Obesity    PCOS (polycystic ovarian syndrome)      Physical Exam: General: The patient is alert and oriented x3 in no acute distress.  Dermatology: Skin is warm, dry and supple bilateral lower extremities. Negative for open lesions or macerations.  Vascular: Palpable pedal pulses bilaterally. No edema or erythema noted. Capillary refill within normal limits.  Neurological: Epicritic and protective threshold grossly intact bilaterally.   Musculoskeletal Exam: Range of motion within normal limits to all pedal and ankle joints bilateral. Muscle strength 5/5 in all groups bilateral.  There is actually minimal tenderness palpation along the Achilles tendon bilateral lower extremities.  Greatly improved since last visit  Assessment: 1.  Achilles tendinitis bilateral, insertional   Plan of Care:  1. Patient evaluated.  2.  Continue physical therapy at [facility] physical therapy and sports rehab on [address]  Also continue home stretching exercises 3.  Continue meloxicam as needed 4.  Continue wearing custom molded orthotics 5.  Return to clinic as needed     Felecia Shelling, DPM [facility]  Dr. Felecia Shelling, DPM    [address] [place], [place] [address]                Office [phone]  Fax [phone]

## 2019-11-05 ENCOUNTER — Encounter: Payer: No Typology Code available for payment source | Admitting: Physical Therapy

## 2019-11-07 ENCOUNTER — Ambulatory Visit: Payer: No Typology Code available for payment source | Admitting: Physical Therapy

## 2019-11-12 ENCOUNTER — Encounter: Payer: No Typology Code available for payment source | Admitting: Physical Therapy

## 2019-11-14 ENCOUNTER — Ambulatory Visit: Payer: No Typology Code available for payment source | Admitting: Physical Therapy

## 2019-11-14 ENCOUNTER — Other Ambulatory Visit: Payer: Self-pay

## 2019-11-14 ENCOUNTER — Encounter: Payer: Self-pay | Admitting: Physical Therapy

## 2019-11-14 DIAGNOSIS — M6281 Muscle weakness (generalized): Secondary | ICD-10-CM

## 2019-11-14 DIAGNOSIS — R262 Difficulty in walking, not elsewhere classified: Secondary | ICD-10-CM

## 2019-11-14 DIAGNOSIS — M79661 Pain in right lower leg: Secondary | ICD-10-CM | POA: Diagnosis not present

## 2019-11-14 DIAGNOSIS — M79662 Pain in left lower leg: Secondary | ICD-10-CM

## 2019-11-14 NOTE — Therapy (Signed)
[facility] [address] [place], [place], [address] Phone: [phone]   Fax:  [phone]  Physical Therapy Treatment  Patient Details  Name: Cynthia George MRN: [id number] Date of Birth: [birth year] Referring Provider (PT): Gala LewandowskyEvans Brent   Encounter Date: 11/14/2019   PT End of Session - 11/14/19 1925    Visit Number 8    Number of Visits 17    Date for PT Re-Evaluation 11/22/19    Authorization Type [facility] [employer] reporting period from 09/27/2019    Authorization Time Period must be coordinated care by patient    Authorization - Visit Number 8    Authorization - Number of Visits 10    Progress Note Due on Visit 10    PT Start Time 1825    PT Stop Time 1905    PT Time Calculation (min) 40 min    Activity Tolerance Patient tolerated treatment well;No increased pain    Behavior During Therapy WFL for tasks assessed/performed           Past Medical History:  Diagnosis Date  . Anxiety   . Depression   . Hyperlipidemia   . Migraines   . Obesity   . PCOS (polycystic ovarian syndrome)     Past Surgical History:  Procedure Laterality Date  . APPENDECTOMY  2013  . HUMERUS FRACTURE SURGERY Left 2002,2003   rod and screws placed and removed  . TONSILLECTOMY AND ADENOIDECTOMY  2004  . WISDOM TOOTH EXTRACTION  2011   All    There were no vitals filed for this visit.   Subjective Assessment - 11/14/19 1825    Subjective Patient reports she is feeling okay upon arrival. Rates pain 3/10 in both achillies tendons. States she took a break last week for a few days due to feeling dizzy, etc, which is also why she missed her last PT appointment. She has recovered from those symptoms and started back to heel raises on Saturday. Tonight she just got off work from a 3 day in a row 12 hour shifts so her heels hurt a little.    Pertinent History Patient is a 34 y.o. female who presents to outpatient physical therapy  with a referral for medical diagnosis left and right achilles tendinigis. 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  Relevant past medical history and comorbidities include morbid obesity, PCOS, HLD, hypothyroidism, migraines. Sx history is tonsils, two surgeries on left arm (humerus - fell as a teenager - no longer bothers her), wisdom teeth, appendectomy. Patient denies hx of cancer, diabetes, hip, knee, back, or neck problems.    How long can you sit comfortably? no time limit    How long can you stand comfortably? more comfortable in the morning, unsure how long it takes to need to sit down    How long can you walk comfortably? more comfortable in the morning, unsure how long it takes to need to sit down    Diagnostic tests Radiographs reported 02/15/2019: "Radiographs: 3 views of skeletally mature adult foot: There is a posterior heel spur present bilaterally.  With small ossicle/osteophytes.  No plantar heel spur noted.  Accessory  spurring noted at the base of the cuboid.  No arthritic changes no other bony deformities noted."    Patient Stated Goals not sure    Currently in Pain? Yes    Pain Score 3     Pain Location Heel    Pain Orientation Right;Left           TREATMENT:  Therapeutic exercise:to centralize symptoms and improve ROM, strength, muscular endurance, and activity tolerance required for successful completion of functional activities. - Standing single leg heel raise with unilateral UE support and ipsilateral hand holding 10# DB. 3x15 each side with at least 1 min rest between sides.  -Side stepping  withredtheraband wrapped around legs to activate core and hip stabilizers and abductors in functional weightbearing position, 2x 30 feet each way with red theraband wrapped twice around ankles (feels in hips strongly). - running man 3x10 each side with unilateral UE touchdown support as needed. Much more challenging on R side.   Manual therapy:to reduce pain and tissue tension, improve range of motion, neuromodulation, in order to promote improved ability to complete functional activities. - prone STM to bilateral triceps surae away from painful region of the tendon to decrease tightness and chronic load on tendon. Softening of tissue noted with STM.   Modality: (unbilled) Dry Needling: (1) 19mm .30 needles placed along the R lateral gastroc with flat fixation grip, to decrease increased muscular spasms and trigger points with the patient positioned in supine; small localized twitch response x1. patient positioned in prone. Patient was educated on risks and benefits of therapy and verbally consents to PT. Dry needling performed by Cira Rue, PT, DPT who is certified in this technique.  -Education on HEP including review of exercises including 3x15 slowsingleleg heel raises with added weight of at least 10# with 1 min rest between sets, 3x a week and single leg isometric heel raises5x 45 secondswith 1 min rest between, 3 x a week.  HOME EXERCISE PROGRAM Access Code: [email or web address] URL: [email or web address] Date: 10/18/2019 Prepared by: [name]  Exercises Isometric Heel Raise at Wall - 4 x weekly - 5 reps - 45 seconds hold - 1 minute rest between reps Standing Heel Raises - 3 x weekly - 3 sets - 15 reps - 8 seconds time for each rep - 1 minute rest between sets Calf Mobilization with Small Ball - 5 minutes time spent Seated Great Toe Extension - 3 x weekly - 20 reps - 2 seconds hold Seated Lesser Toes Extension - 3 x weekly - 20 reps - 2 seconds hold Toe  Spreading - 3 x weekly - 20 reps - 2 seconds hold    PT Education - 11/14/19 1925    Education Details Exercise purpose/form. Self management techniques    Person(s) Educated Patient    Methods Explanation;Demonstration;Tactile cues;Verbal cues    Comprehension Verbalized understanding;Returned demonstration;Verbal cues required;Tactile cues required;Need further instruction            PT Short Term Goals - 10/03/19 0942      PT SHORT TERM GOAL #1   Title Patient will be independent in home exercise program to improve strength/mobility for better functional independence with ADLs.    Baseline initial HEP provided at visit 2 (10/02/2019);    Time 4    Period Weeks    Status On-going    Target Date 10/25/19             PT Long Term Goals - 09/27/19 1755  PT LONG TERM GOAL #1   Title Patient will increase LEFS score 8 points to demonstrate better functional mobility    Baseline need to do survey    Time 8    Period Weeks    Status New    Target Date 11/22/19      PT LONG TERM GOAL #2   Title Patient will report a worst pain of 3/10 on VAS in B ankles/feet to improve tolerance with ADLs and reduced symptoms with activities    Baseline 8/10 R foot, 3/10 left foot    Time 8    Period Weeks    Status New    Target Date 11/22/19      PT LONG TERM GOAL #3   Title Patient will increase BLE gross strength to 4+/5 as to improve functional strength for independent gait, increased standing tolerance and increased ADL ability.    Time 8    Period Weeks    Status New    Target Date 11/22/19      PT LONG TERM GOAL #4   Title Patent will be able to stand for 15 minutes with less than 5/10 pain to complete standing activities    Baseline 8/10 right foot, 3/10 left foot    Time 8    Period Weeks    Status New    Target Date 11/22/19                 Plan - 11/14/19 1933    Clinical Impression Statement Patient tolerated treatment well and found heel raises  difficult and uncomfortable during their performance but no worse following. Continued to progress hip and balance strength. Utilized soft tissue mobilization and dry needling to decrease tension in triceps surae for decreased pain and tightness. Patient would benefit from continued management of limiting condition by skilled physical therapist to address remaining impairments and functional limitations to work towards stated goals and return to PLOF or maximal functional independence.    Personal Factors and Comorbidities Comorbidity 1    Comorbidities obesity    Examination-Activity Limitations Locomotion Level;Stairs;Sleep    Examination-Participation Restrictions Community Activity    Stability/Clinical Decision Making Stable/Uncomplicated    Rehab Potential Fair    PT Frequency 2x / week    PT Duration 8 weeks    PT Treatment/Interventions Cryotherapy;Electrical Stimulation;Iontophoresis 4mg /ml Dexamethasone;Moist Heat;Ultrasound;Gait training;Stair training;Functional mobility training;Therapeutic activities;Therapeutic exercise;Patient/family education;Manual techniques;Passive range of motion;Dry needling;Taping    PT Next Visit Plan scaled loading, manual therapy    PT Home Exercise Plan Medbridge Access Code: [email or web address]    Consulted and Agree with Plan of Care Patient           Patient will benefit from skilled therapeutic intervention in order to improve the following deficits and impairments:  Abnormal gait, Decreased activity tolerance, Decreased endurance, Decreased range of motion, Decreased strength, Pain, Obesity, Impaired flexibility, Difficulty walking, Decreased mobility  Visit Diagnosis: Bilateral calf pain  Difficulty in walking, not elsewhere classified  Muscle weakness (generalized)     Problem List Patient Active Problem List   Diagnosis Date Noted  . Infertility associated with anovulation 06/23/2019  . Major depression, single episode, in complete remission  (HCC) 06/22/2019  . Postpartum hypertension 11/18/2016  . Gestational hypertension without significant proteinuria 11/01/2016  . Morbid obesity (HCC) 07/27/2016  . Hypothyroid 06/30/2016  . Elevated LFTs 05/27/2016  . HLD (hyperlipidemia)   . Migraines   . PCOS (polycystic ovarian syndrome)     07/25/2016. Luretha Murphy, PT, DPT  11/14/19, 7:34 PM  [facility] [address] [place], [address] Phone: [phone]   Fax:  [phone]  Name: Cynthia George MRN: [id number] Date of Birth: [birth year]

## 2019-11-16 ENCOUNTER — Other Ambulatory Visit: Payer: Self-pay | Admitting: Family Medicine

## 2019-11-16 DIAGNOSIS — E039 Hypothyroidism, unspecified: Secondary | ICD-10-CM

## 2019-11-19 ENCOUNTER — Encounter: Payer: No Typology Code available for payment source | Admitting: Physical Therapy

## 2019-11-21 ENCOUNTER — Encounter: Payer: Self-pay | Admitting: Physical Therapy

## 2019-11-21 ENCOUNTER — Ambulatory Visit: Payer: No Typology Code available for payment source | Attending: Podiatry | Admitting: Physical Therapy

## 2019-11-21 ENCOUNTER — Other Ambulatory Visit: Payer: Self-pay

## 2019-11-21 DIAGNOSIS — M79662 Pain in left lower leg: Secondary | ICD-10-CM | POA: Diagnosis present

## 2019-11-21 DIAGNOSIS — R262 Difficulty in walking, not elsewhere classified: Secondary | ICD-10-CM | POA: Diagnosis present

## 2019-11-21 DIAGNOSIS — M79661 Pain in right lower leg: Secondary | ICD-10-CM

## 2019-11-21 DIAGNOSIS — M6281 Muscle weakness (generalized): Secondary | ICD-10-CM

## 2019-11-21 NOTE — Therapy (Signed)
[facility] [address] [place], [address] Phone: [phone]   Fax:  [phone]  Physical Therapy Treatment / Progress Note / Re-Certification Reporting period: 09/27/2019 - 11/21/2019  Patient Details  Name: Cynthia George MRN: [id number] Date of Birth: [birth year] Referring Provider (PT): Daylene Katayama   Encounter Date: 11/21/2019   PT End of Session - 11/22/19 1311    Visit Number 9    Number of Visits 17    Date for PT Re-Evaluation 11/22/19    Authorization Type [employer] reporting period from 09/27/2019    Authorization Time Period must be coordinated care by patient    Authorization - Visit Number 9    Authorization - Number of Visits 10    Progress Note Due on Visit 10    PT Start Time 1815    PT Stop Time 1900    PT Time Calculation (min) 45 min    Activity Tolerance Patient tolerated treatment well    Behavior During Therapy [facility] for tasks assessed/performed           Past Medical History:  Diagnosis Date  . Anxiety   . Depression   . Hyperlipidemia   . Migraines   . Obesity   . PCOS (polycystic ovarian syndrome)     Past Surgical History:  Procedure Laterality Date  . APPENDECTOMY  2013  . HUMERUS FRACTURE SURGERY Left 2002,2003   rod and screws placed and removed  . TONSILLECTOMY AND ADENOIDECTOMY  2004  . WISDOM TOOTH EXTRACTION  2011   All    There were no vitals filed for this visit.   Subjective Assessment - 11/21/19 1849    Subjective Patient reports she has been hurting more this week than before. She had increased pain at B achilles insertions on Thursday that was "horrible pain." She could not complete single leg heel raise due to pain so she has been doing double heel raises. She had pain yesterday and less pain today. She also noticed she was limping at work this week. She did not get a bruise from dry needling. She reports she has been feeling better with PT  until last thursday. She took meloxicam last night and thinks this may have helped her today. Her podiatrist reccomended to take meloxicam for a week if the pain reutrned for a week and then come to see him again if it doesn't get worse. Paitient reports current pain at 5/10 at the achilles insertion bilaterally. Prior to this time pt felt PT was helping a lot. Reports before thursday worst pain in both heels was 2-3/10. She was able to stand at least 15 min with up to 2/10 pain. Since Thursday her pain has been up to 8/10 both heels, standing pain has been up to 8/10 (she stands when needed despite pain at work).    Pertinent History Patient is a 34 y.o. female who presents to outpatient physical therapy with a referral for medical diagnosis left and right achilles tendinigis. 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  Relevant past medical history and comorbidities include morbid obesity, PCOS, HLD, hypothyroidism, migraines. Sx history is tonsils,  two surgeries on left arm (humerus - fell as a teenager - no longer bothers her), wisdom teeth, appendectomy. Patient denies hx of cancer, diabetes, hip, knee, back, or neck problems.    Limitations Standing;Walking;House hold activities   work, running, softball   How long can you sit comfortably? no time limit    How long can you stand comfortably? --    How long can you walk comfortably? --    Diagnostic tests Radiographs reported 02/15/2019: "Radiographs: 3 views of skeletally mature adult foot: There is a posterior heel spur present bilaterally.  With small ossicle/osteophytes.  No plantar heel spur  noted.  Accessory spurring noted at the base of the cuboid.  No arthritic changes no other bony deformities noted."    Patient Stated Goals be able to complete her usual activities including return to softball without pain    Currently in Pain? 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              [facility] PT Assessment - 11/22/19 0001      Assessment   Medical Diagnosis Achilles tendonitis, bilateral    Referring Provider (PT) Daylene Katayama    Onset Date/Surgical Date --   11/2018   Hand Dominance Right    Next MD Visit 09/27/19    Prior Therapy no      Precautions   Precautions None      Restrictions   Weight Bearing Restrictions No      Home Environment   Living Environment Private residence    Living Arrangements Spouse/significant other;Children    Available Help at Discharge Family    Type of [place] to enter    Entrance Stairs-Number of Steps 6    Entrance Stairs-Rails [place] One level    [place] None      Prior Function   Level of Independence Independent    Vocation Full time employment    Vocation Requirements standing, walking, bending, lifting, pushing    Leisure softball      Cognition   Overall Cognitive Status Within Functional Limits for  tasks assessed      Observation/Other Assessments   Focus on Therapeutic Outcomes (FOTO)  50           OBJECTIVE FOTO = 50 LEFS = 59/80  Palpation: tenderness to palpation  R achilles tendon insertion and Tenderness to palpation L achilles tendon insertion  POSTURE: WFL  AROM/PROM: DF: R = 8/10, L = 10/12  STRENGTH:  Graded on a 0-5 scale Muscle Group Left Right  Ankle DF 5/5 5/5  Ankle PF 5/5 5/5  Ankle eversion / inversion 5/5 B ankle   Able to  perform 3x 15 single leg heel raises while holding 10# DB with discomfort during and the following day on 11/14/2019, deferred today due to flair in symptoms and not wanting to make them worse. Prior to that pt was able to perform 3x15 reps single leg heel raise each side every other day for 3 weeks.   SENSATION: WNL BLE  FUNCTIONAL MOBILITY: independent with transfer and mobility   GAIT: independent without AD, decreased gait speed, difficulty with steps      TREATMENT:  Therapeutic exercise:to centralize symptoms and improve ROM, strength, muscular endurance, and activity tolerance required for successful completion of functional activities. - single leg isometric heel raise x 25 seconds each side (painful during, no worse after).  - measurements to assess progress (see above).  - Education on diagnosis, prognosis, POC, anatomy and physiology of current condition.  - updated HEP to regress back to daily single leg isometric heel raise 5x45 seconds with at least 60 second rest between sets. Advice to re-introduce B heel raises with very slow velocity if pain calms down while doing the isometrics over a few days.   HOME EXERCISE PROGRAM Access Code: [email or web address] URL: [email or web address] Date: 10/18/2019 Prepared by: Rosita Kea  Exercises Isometric Heel Raise at [place] - 4 x weekly - 5 reps - 45 seconds hold - 1 minute rest between reps Standing Heel Raises - 3 x weekly - 3 sets - 15 reps - 8 seconds  time for each rep - 1 minute rest between sets Calf Mobilization with Small Ball - 5 minutes time spent Seated Great Toe Extension - 3 x weekly - 20 reps - 2 seconds hold Seated Lesser Toes Extension - 3 x weekly - 20 reps - 2 seconds hold Toe Spreading - 3 x weekly - 20 reps - 2 seconds hold    PT Education - 11/22/19 1316    Education Details Exercise purpose/form. Self management techniques. POC    Person(s) Educated Patient    Methods Explanation;Demonstration;Tactile cues;Verbal cues    Comprehension Verbalized understanding;Returned demonstration;Verbal cues required;Tactile cues required;Need further instruction            PT Short Term Goals - 11/22/19 1317      PT SHORT TERM GOAL #1   Title Patient will be independent in home exercise program to improve strength/mobility for better functional independence with ADLs.    Baseline initial HEP provided at visit 2 (10/02/2019);    Time 4    Period Weeks    Status Achieved    Target Date 10/25/19             PT Long Term Goals - 11/22/19 1317      PT LONG TERM GOAL #1   Title Demonstrate improved FOTO score to equal or greater than 60 by visit #13 to demonstrate improvement in overall condition and self-reported functional ability.    Baseline 46 (09/27/2019); 50 (11/21/2019);    Time 12    Period Weeks    Status Partially Met   TARGET DATE FOR ALL LONG TERM GOALS: 02/14/2020   Target Date --      PT LONG TERM GOAL #2   Title Patient will report a worst pain of 3/10 on VAS in B ankles/feet to improve tolerance with ADLs and reduced symptoms with activities    Baseline 8/10 R foot, 3/10 left foot (09/27/2019); was staying under 2/10 for about 3 weeks before flairing last thursday to up to 8/10 (11/21/2019);    Time 12  Period Weeks    Status Partially Met      PT LONG TERM GOAL #3   Title Patient will increase BLE gross strength to 4+/5 as to improve functional strength for independent gait, increased standing tolerance  and increased ADL ability.    Baseline 4/5 in some planes (09/27/2019); 5/5 seated MMT but unable to do more than 15 single leg heel raises (11/22/2019);    Time 12    Period Weeks    Status Partially Met      PT LONG TERM GOAL #4   Title Patent will be able to stand for 15 minutes with less than 5/10 pain to complete standing activities    Baseline 8/10 right foot, 3/10 left foot (09/27/2019); pt reports she has been able to stand longer than 15 min with pain no greater than 2/10 for at least three weeks until last Thursday she experienced a flair where her pain goes up to 8/10 (11/21/2019);    Time 12    Period Weeks    Status Partially Met      PT LONG TERM GOAL #5   Title Be independent with a long-term home exercise program for self-management of symptoms.    Baseline Patient currently participating well in appropriate HEP (11/21/2019);    Time 12    Period Weeks    Status New                 Plan - 11/22/19 1332    Clinical Impression Statement Patient has attended 9 physical therapy sessions this episode of care and was making excellent progress until following her visit last Wednesday when she experienced significant increase in pain and irritability the day following progressing exercises to loaded single leg raises. Prior to that she had reported pain no higher than 2/10 with functional activties. Has reported excellent participation in HEP but felt ill and took off a few days of exercise the week prior to the flair. Have regressed exercises back to isometric to allow apparent reactive tendiopathy to settle and then progress slowly again as tolerated and with slower velocity during exercise to continue remodeling tendon without causing undue irritation. 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    Personal Factors and Comorbidities Comorbidity 3+;Fitness;Past/Current Experience;Time since onset of injury/illness/exacerbation    Comorbidities Relevant past medical history and comorbidities include morbid obesity, PCOS, HLD, hypothyroidism, migraines. Sx history is tonsils, two surgeries on left arm (humerus - fell as a teenager - no longer bothers her), wisdom teeth, appendectomy.    Examination-Activity Limitations Locomotion Level;Stairs;Sleep;Stand;Other   running, walking, jumping, leaping, skipping, stairs   Examination-Participation Restrictions Community Activity;Occupation;Interpersonal Relationship   working, transporting and transferring patients, softball   Stability/Clinical Decision Making Stable/Uncomplicated    Rehab Potential Fair    PT Frequency 2x / week    PT Duration 12 weeks    PT Treatment/Interventions Cryotherapy;Electrical Stimulation;Iontophoresis 97m/ml Dexamethasone;Moist Heat;Ultrasound;Gait training;Stair training;Functional mobility training;Therapeutic activities;Therapeutic exercise;Patient/family education;Manual techniques;Passive range of motion;Dry needling;Taping;ADLs/Self Care Home  Management;Neuromuscular re-education;Joint Manipulations;Spinal Manipulations    PT Next Visit Plan scaled loading, manual therapy    PT Home Exercise Plan Medbridge Access Code: [email or web address]    Consulted and Agree with Plan of Care Patient           Patient will benefit from skilled therapeutic intervention in order to improve the following deficits  and impairments:  Abnormal gait, Decreased activity tolerance, Decreased endurance, Decreased range of motion, Decreased strength, Pain, Obesity, Impaired flexibility, Difficulty walking, Decreased mobility, Impaired perceived functional ability  Visit Diagnosis: Bilateral calf pain  Difficulty in walking, not elsewhere classified  Muscle weakness (generalized)     Problem List Patient Active Problem List   Diagnosis Date Noted  . Infertility associated with anovulation 06/23/2019  . Major depression, single episode, in complete remission ([place]) 06/22/2019  . Postpartum hypertension 11/18/2016  . Gestational hypertension without significant proteinuria 11/01/2016  . Morbid obesity ([place]) 07/27/2016  . Hypothyroid 06/30/2016  . Elevated LFTs 05/27/2016  . HLD (hyperlipidemia)   . Migraines   . PCOS (polycystic ovarian syndrome)     Everlean Alstrom. Graylon Good, PT, DPT 11/22/19, 1:45 PM  [facility] [address] [place], [address] Phone: [phone]   Fax:  [phone]  Name: CANDENCE SEASE MRN: [id number] Date of Birth: 03-09-1986

## 2019-11-28 ENCOUNTER — Encounter: Payer: Self-pay | Admitting: Physical Therapy

## 2019-11-28 ENCOUNTER — Other Ambulatory Visit: Payer: Self-pay

## 2019-11-28 ENCOUNTER — Ambulatory Visit: Payer: No Typology Code available for payment source | Admitting: Physical Therapy

## 2019-11-28 DIAGNOSIS — M79661 Pain in right lower leg: Secondary | ICD-10-CM | POA: Diagnosis not present

## 2019-11-28 DIAGNOSIS — M6281 Muscle weakness (generalized): Secondary | ICD-10-CM

## 2019-11-28 DIAGNOSIS — R262 Difficulty in walking, not elsewhere classified: Secondary | ICD-10-CM

## 2019-11-28 DIAGNOSIS — M79662 Pain in left lower leg: Secondary | ICD-10-CM

## 2019-11-28 NOTE — Therapy (Signed)
[facility] [address], [place], [address] Phone: [phone]   Fax:  [phone]  Physical Therapy Treatment / Progress Note Reporting period: 09/27/2019 - 11/28/2019  Patient Details  Name: Cynthia George MRN: [id number] Date of Birth: [birth year] Referring Provider (PT): Daylene Katayama   Encounter Date: 11/28/2019   PT End of Session - 11/28/19 1903    Visit Number 10    Number of Visits 17    Date for PT Re-Evaluation 11/22/19    Authorization Type [employer] reporting period from 09/27/2019    Authorization Time Period must be coordinated care by patient    Authorization - Visit Number 10    Authorization - Number of Visits 10    Progress Note Due on Visit 10    PT Start Time 1848    PT Stop Time 1926    PT Time Calculation (min) 38 min    Activity Tolerance Patient tolerated treatment well    Behavior During Therapy [facility] for tasks assessed/performed           Past Medical History:  Diagnosis Date  . Anxiety   . Depression   . Hyperlipidemia   . Migraines   . Obesity   . PCOS (polycystic ovarian syndrome)     Past Surgical History:  Procedure Laterality Date  . APPENDECTOMY  2013  . HUMERUS FRACTURE SURGERY Left 2002,2003   rod and screws placed and removed  . TONSILLECTOMY AND ADENOIDECTOMY  2004  . WISDOM TOOTH EXTRACTION  2011   All    There were no vitals filed for this visit.   Subjective Assessment - 11/28/19 1858    Subjective Patient reports she is feeling better and has been doing bilateral isometric heel raises since last treatment session and feeling better overall. She still had up to 5/10 pain at the end of her last shift (yesterday) and had some pain when she awoke this morning but it improves with AROM. She also reports doing some isometric during work when she has pain and that improved her pain. Reports no pain upon arrival after being off today.    Pertinent  History Patient is a 34 y.o. female who presents to outpatient physical therapy with a referral for medical diagnosis left and right achilles tendinigis. 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  Relevant past medical history and comorbidities include morbid obesity, PCOS, HLD, hypothyroidism, migraines. Sx history is tonsils, two surgeries on left arm (humerus - fell as a teenager - no longer bothers her), wisdom teeth, appendectomy. Patient denies hx of cancer, diabetes, hip, knee, back, or neck problems.    Limitations Standing;Walking;House hold activities   work, running, softball   How long can you sit comfortably? no time limit    Diagnostic tests Radiographs reported 02/15/2019: "Radiographs: 3 views of skeletally mature adult foot: There is a posterior heel spur present bilaterally.  With small ossicle/osteophytes.  No plantar heel spur noted.  Accessory spurring noted at the base of the cuboid.  No arthritic changes no other bony deformities noted."    Patient Stated Goals be able to complete her usual activities including  return to softball without pain    Currently in Pain? No/denies    Pain Onset More than a month ago          TREATMENT:  Therapeutic exercise:to centralize symptoms and improve ROM, strength, muscular endurance, and activity tolerance required for successful completion of functional activities. - single leg isometric heel raise 5x 25 seconds each side, with at least 1 min rest (painful during, mild increase after).   - updated HEP to daily single leg isometric heel raise 5x45 seconds with at least 60 second  rest between sets.   Manual therapy: to reduce pain and tissue tension, improve range of motion, neuromodulation, in order to promote improved ability to complete functional activities. Prone position:  - STM with "the stick" assist to bilateral triceps surae. STM to bilateral plantar surface of foot.    HOME EXERCISE PROGRAM Access Code: [email or web address] URL: [email or web address] Date: 10/18/2019 Prepared by: Rosita Kea  Exercises Isometric Heel Raise at [place] - 4 x weekly - 5 reps - 45 seconds hold - 1 minute rest between reps Standing Heel Raises - 3 x weekly - 3 sets - 15 reps - 8 seconds time for each rep - 1 minute rest between sets Calf Mobilization with Small Ball - 5 minutes time spent Seated Great Toe Extension - 3 x weekly - 20 reps - 2 seconds hold Seated Lesser Toes Extension - 3 x weekly - 20 reps - 2 seconds hold Toe Spreading - 3 x weekly - 20 reps - 2 seconds hold    PT Education - 11/28/19 1902    Education Details Exercise purpose/form. Self management techniques. POC    Person(s) Educated Patient    Methods Explanation;Demonstration;Verbal cues    Comprehension Verbalized understanding;Verbal cues required;Returned demonstration;Need further instruction            PT Short Term Goals - 11/22/19 1317      PT SHORT TERM GOAL #1   Title Patient will be independent in home exercise program to improve strength/mobility for better functional independence with ADLs.    Baseline initial HEP provided at visit 2 (10/02/2019);    Time 4    Period Weeks    Status Achieved    Target Date 10/25/19             PT Long Term Goals - 11/22/19 1317      PT LONG TERM GOAL #1   Title Demonstrate improved FOTO score to equal or greater than 60 by visit #13 to demonstrate improvement in overall condition and self-reported functional ability.    Baseline 46 (09/27/2019); 50 (11/21/2019);    Time 12    Period Weeks    Status Partially Met   TARGET DATE FOR ALL  LONG TERM GOALS: 02/14/2020   Target Date --      PT LONG TERM GOAL #2   Title Patient will report a worst pain of 3/10 on VAS in B ankles/feet to improve tolerance with ADLs and reduced symptoms with activities    Baseline 8/10 R foot, 3/10 left foot (09/27/2019); was staying under 2/10 for about 3 weeks before flairing last thursday to up to 8/10 (11/21/2019);    Time 12    Period Weeks    Status Partially Met      PT LONG TERM GOAL #3   Title Patient will increase BLE gross strength to 4+/5 as to improve functional strength for independent gait, increased standing tolerance and increased ADL ability.    Baseline  4/5 in some planes (09/27/2019); 5/5 seated MMT but unable to do more than 15 single leg heel raises (11/22/2019);    Time 12    Period Weeks    Status Partially Met      PT LONG TERM GOAL #4   Title Patent will be able to stand for 15 minutes with less than 5/10 pain to complete standing activities    Baseline 8/10 right foot, 3/10 left foot (09/27/2019); pt reports she has been able to stand longer than 15 min with pain no greater than 2/10 for at least three weeks until last Thursday she experienced a flair where her pain goes up to 8/10 (11/21/2019);    Time 12    Period Weeks    Status Partially Met      PT LONG TERM GOAL #5   Title Be independent with a long-term home exercise program for self-management of symptoms.    Baseline Patient currently participating well in appropriate HEP (11/21/2019);    Time 12    Period Weeks    Status New                 Plan - 11/28/19 1935    Clinical Impression Statement Patient tolerated treatment well overall but did report some mild increase in pain by end of session. Will continue to monitor at future visits. Does appear to be improving again. Patient has attended 10 physical therapy sessions this episode of care and was making excellent progress until following her visit two weeks ago when she experienced significant increase in  pain and irritability the day following progressing exercises to loaded single leg raises. Prior to that she had reported pain no higher than 2/10 with functional activties. Has reported excellent participation in HEP but felt ill and took off a few days of exercise the week prior to the flair. Have regressed exercises back to isometric to allow apparent reactive tendiopathy to settle and then progress slowly again as tolerated and with slower velocity during exercise to continue remodeling tendon without causing undue irritation. 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    Personal Factors and Comorbidities Comorbidity 3+;Fitness;Past/Current Experience;Time since onset of injury/illness/exacerbation    Comorbidities Relevant past medical history and comorbidities include morbid obesity, PCOS, HLD, hypothyroidism, migraines. Sx history is tonsils, two surgeries on left arm (humerus - fell as a teenager - no longer bothers her), wisdom teeth, appendectomy.    Examination-Activity Limitations Locomotion Level;Stairs;Sleep;Stand;Other   running, walking, jumping, leaping, skipping, stairs   Examination-Participation Restrictions Community Activity;Occupation;Interpersonal Relationship   working, transporting and transferring patients, softball   Stability/Clinical Decision Making  Stable/Uncomplicated    Rehab Potential Fair    PT Frequency 2x / week    PT Duration 12 weeks    PT Treatment/Interventions Cryotherapy;Electrical Stimulation;Iontophoresis 63m/ml Dexamethasone;Moist Heat;Ultrasound;Gait training;Stair training;Functional mobility training;Therapeutic activities;Therapeutic exercise;Patient/family education;Manual techniques;Passive range of motion;Dry needling;Taping;ADLs/Self Care Home Management;Neuromuscular re-education;Joint Manipulations;Spinal Manipulations    PT Next Visit Plan scaled loading, manual therapy  PT Home Exercise Plan Medbridge Access Code: [email or web address]    Consulted and Agree with Plan of Care Patient           Patient will benefit from skilled therapeutic intervention in order to improve the following deficits and impairments:  Abnormal gait, Decreased activity tolerance, Decreased endurance, Decreased range of motion, Decreased strength, Pain, Obesity, Impaired flexibility, Difficulty walking, Decreased mobility, Impaired perceived functional ability  Visit Diagnosis: Bilateral calf pain  Difficulty in walking, not elsewhere classified  Muscle weakness (generalized)     Problem List Patient Active Problem List   Diagnosis Date Noted  . Infertility associated with anovulation 06/23/2019  . Major depression, single episode, in complete remission ([place]) 06/22/2019  . Postpartum hypertension 11/18/2016  . Gestational hypertension without significant proteinuria 11/01/2016  . Morbid obesity ([place]) 07/27/2016  . Hypothyroid 06/30/2016  . Elevated LFTs 05/27/2016  . HLD (hyperlipidemia)   . Migraines   . PCOS (polycystic ovarian syndrome)     Everlean Alstrom. Graylon Good, PT, DPT 11/28/19, 7:37 PM  [facility] [address], [place], [address] Phone: [phone]   Fax:  [phone]  Name: Cynthia George MRN: [id number] Date of Birth: Oct 02, 1985

## 2019-12-05 ENCOUNTER — Other Ambulatory Visit: Payer: Self-pay

## 2019-12-05 ENCOUNTER — Encounter: Payer: Self-pay | Admitting: Physical Therapy

## 2019-12-05 ENCOUNTER — Ambulatory Visit: Payer: No Typology Code available for payment source | Admitting: Physical Therapy

## 2019-12-05 DIAGNOSIS — R262 Difficulty in walking, not elsewhere classified: Secondary | ICD-10-CM

## 2019-12-05 DIAGNOSIS — M79661 Pain in right lower leg: Secondary | ICD-10-CM | POA: Diagnosis not present

## 2019-12-05 DIAGNOSIS — M6281 Muscle weakness (generalized): Secondary | ICD-10-CM

## 2019-12-05 NOTE — Therapy (Signed)
[facility] [address], [place], [address] Phone: [phone]   Fax:  [phone]  Physical Therapy Treatment  Patient Details  Name: Cynthia George MRN: [id number] Date of Birth: 04/10/1986 Referring Provider (PT): Daylene Katayama   Encounter Date: 12/05/2019   PT End of Session - 12/05/19 1846    Visit Number 11    Number of Visits 33    Date for PT Re-Evaluation 02/14/20    Authorization Type [employer] reporting period from 11/28/2019    Authorization Time Period must be coordinated care by patient    Authorization - Visit Number --    Authorization - Number of Visits --    Progress Note Due on Visit 10    PT Start Time 1820    PT Stop Time 1905    PT Time Calculation (min) 45 min    Activity Tolerance Patient tolerated treatment well    Behavior During Therapy [facility] for tasks assessed/performed           Past Medical History:  Diagnosis Date  . Anxiety   . Depression   . Hyperlipidemia   . Migraines   . Obesity   . PCOS (polycystic ovarian syndrome)     Past Surgical History:  Procedure Laterality Date  . APPENDECTOMY  2013  . HUMERUS FRACTURE SURGERY Left 2002,2003   rod and screws placed and removed  . TONSILLECTOMY AND ADENOIDECTOMY  2004  . WISDOM TOOTH EXTRACTION  2011   All    There were no vitals filed for this visit.   Subjective Assessment - 12/05/19 1822    Subjective Patient reports she is feeling okay today. She worked today which was day 3 of 3 days. She reports her heels are hurting 3-4/10 R > L when walking around. She reports she felt a little pain the day after her last PT session but it was manageable. Today she was very busy at work. Her pain is definitely better than 2 weeks ago. Mornings have been a bit stiff but it helps to do a little ROM before getting out of bed. Her HEP has gone well with single leg holds. Pain duirng but not after.    Pertinent History  Patient is a 34 y.o. female who presents to outpatient physical therapy with a referral for medical diagnosis left and right achilles tendinigis. 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  Relevant past medical history and comorbidities include morbid obesity, PCOS, HLD, hypothyroidism, migraines. Sx history is tonsils, two surgeries on left arm (humerus - fell as a teenager - no longer bothers her), wisdom teeth, appendectomy. Patient denies hx of cancer, diabetes, hip, knee, back, or neck problems.    Limitations Standing;Walking;House hold activities   work, running, softball   How long can you sit comfortably? no time limit    Diagnostic tests Radiographs reported 02/15/2019: "Radiographs: 3 views of skeletally mature adult foot: There is a posterior heel spur present bilaterally.  With small ossicle/osteophytes.  No plantar heel spur noted.  Accessory spurring noted at the base of the cuboid.  No arthritic changes no other bony deformities noted."  Patient Stated Goals be able to complete her usual activities including return to softball without pain    Currently in Pain? Yes    Pain Score 4     Pain Location Heel    Pain Orientation Right;Left    Pain Onset More than a month ago            TREATMENT:  Therapeutic exercise:to centralize symptoms and improve ROM, strength, muscular endurance, and activity tolerance required for successful completion of functional activities. - double leg heel raise 3x15 SLOW (5 seconds up, 5 seconds down), with at least 1 min rest (painful during, mild increase after).   - updated HEP (see  below) - seated great toe flexion with foot completely flat on floor using red theraband around great toe, 1x30 each side.  - Toe Yoga: great toe extension with small toes flexion pressure into floor, 2 sec holds; small toes extension with great toe flexion pressure into floor, 2 sec holds. x20 each way alternating each rep. Ball of foot and heel maintains contact with floor. To improve intrinsic foot muscle activation and strength in order to better support arch and intrinsic foot structures.  - Toe splays, repeated 3 sec holds. Ball of foot and heel maintains contact with floor. X 20 both feet. To improve intrinsic foot muscle activation and strength in order to better support arch and intrinsic foot structures.   Manual therapy: to reduce pain and tissue tension, improve range of motion, neuromodulation, in order to promote improved ability to complete functional activities. Prone position:  - STM bilateral triceps surae to decrease tension and improve pain.    HOME EXERCISE PROGRAM Double leg heel raises 3x15 slow (5 seconds up, 5 seconds down), 1 min rest between sets, 3x a week Single leg isometric heel raises 5 x 45 seconds with 1 min rest between, 3x a week    PT Education - 12/05/19 1845    Education Details Exercise purpose/form. Self management techniques    Person(s) Educated Patient    Methods Explanation;Demonstration;Tactile cues;Verbal cues;Handout    Comprehension Verbalized understanding;Verbal cues required;Returned demonstration;Tactile cues required;Need further instruction            PT Short Term Goals - 11/22/19 1317      PT SHORT TERM GOAL #1   Title Patient will be independent in home exercise program to improve strength/mobility for better functional independence with ADLs.    Baseline initial HEP provided at visit 2 (10/02/2019);    Time 4    Period Weeks    Status Achieved    Target Date 10/25/19             PT Long Term Goals - 11/22/19 1317        PT LONG TERM GOAL #1   Title Demonstrate improved FOTO score to equal or greater than 60 by visit #13 to demonstrate improvement in overall condition and self-reported functional ability.    Baseline 46 (09/27/2019); 50 (11/21/2019);    Time 12    Period Weeks    Status Partially Met   TARGET DATE FOR ALL LONG TERM GOALS: 02/14/2020   Target Date --      PT LONG TERM GOAL #2   Title Patient will report a worst pain of 3/10 on VAS in B ankles/feet to improve tolerance with ADLs and reduced symptoms with activities    Baseline 8/10 R foot, 3/10 left foot (09/27/2019); was staying under 2/10 for about 3 weeks before flairing last thursday  to up to 8/10 (11/21/2019);    Time 12    Period Weeks    Status Partially Met      PT LONG TERM GOAL #3   Title Patient will increase BLE gross strength to 4+/5 as to improve functional strength for independent gait, increased standing tolerance and increased ADL ability.    Baseline 4/5 in some planes (09/27/2019); 5/5 seated MMT but unable to do more than 15 single leg heel raises (11/22/2019);    Time 12    Period Weeks    Status Partially Met      PT LONG TERM GOAL #4   Title Patent will be able to stand for 15 minutes with less than 5/10 pain to complete standing activities    Baseline 8/10 right foot, 3/10 left foot (09/27/2019); pt reports she has been able to stand longer than 15 min with pain no greater than 2/10 for at least three weeks until last Thursday she experienced a flair where her pain goes up to 8/10 (11/21/2019);    Time 12    Period Weeks    Status Partially Met      PT LONG TERM GOAL #5   Title Be independent with a long-term home exercise program for self-management of symptoms.    Baseline Patient currently participating well in appropriate HEP (11/21/2019);    Time 12    Period Weeks    Status New                 Plan - 12/05/19 1942    Clinical Impression Statement Patient tolerated treatment well overall with increased  pain during but not after heel raises. Re-introduced dynamic loading of achilles tendons but used a clock to ensure very slow reps to decrease chance of irritating tendon. Also continued with foot strengthening exercises with feet flat in position to not load Achilles while strengthening accessory muscles to support improved standing and functional tolerance. Patient reported she felt good by end of session. Patient would benefit from continued management of limiting condition by skilled physical therapist to address remaining impairments and functional limitations to work towards stated goals and return to PLOF or maximal functional independence.    Personal Factors and Comorbidities Comorbidity 3+;Fitness;Past/Current Experience;Time since onset of injury/illness/exacerbation    Comorbidities Relevant past medical history and comorbidities include morbid obesity, PCOS, HLD, hypothyroidism, migraines. Sx history is tonsils, two surgeries on left arm (humerus - fell as a teenager - no longer bothers her), wisdom teeth, appendectomy.    Examination-Activity Limitations Locomotion Level;Stairs;Sleep;Stand;Other   running, walking, jumping, leaping, skipping, stairs   Examination-Participation Restrictions Community Activity;Occupation;Interpersonal Relationship   working, transporting and transferring patients, softball   Stability/Clinical Decision Making Stable/Uncomplicated    Rehab Potential Fair    PT Frequency 2x / week    PT Duration 12 weeks    PT Treatment/Interventions Cryotherapy;Electrical Stimulation;Iontophoresis 63m/ml Dexamethasone;Moist Heat;Ultrasound;Gait training;Stair training;Functional mobility training;Therapeutic activities;Therapeutic exercise;Patient/family education;Manual techniques;Passive range of motion;Dry needling;Taping;ADLs/Self Care Home Management;Neuromuscular re-education;Joint Manipulations;Spinal Manipulations    PT Next Visit Plan scaled loading, manual therapy     PT Home Exercise Plan Medbridge Access Code: [email or web address]    Consulted and Agree with Plan of Care Patient           Patient will benefit from skilled therapeutic intervention in order to improve the following deficits and impairments:  Abnormal gait, Decreased activity tolerance, Decreased endurance, Decreased range of motion, Decreased strength, Pain, Obesity, Impaired flexibility, Difficulty walking, Decreased mobility, Impaired perceived functional ability  Visit Diagnosis: Bilateral calf pain  Difficulty in walking, not elsewhere classified  Muscle weakness (generalized)     Problem List Patient Active Problem List   Diagnosis Date Noted  . Infertility associated with anovulation 06/23/2019  . Major depression, single episode, in complete remission ([place]) 06/22/2019  . Postpartum hypertension 11/18/2016  . Gestational hypertension without significant proteinuria 11/01/2016  . Morbid obesity ([place]) 07/27/2016  . Hypothyroid 06/30/2016  . Elevated LFTs 05/27/2016  . HLD (hyperlipidemia)   . Migraines   . PCOS (polycystic ovarian syndrome)     Everlean Alstrom. Graylon Good, PT, DPT 12/05/19, 7:42 PM  [facility] [address], [place], [address] Phone: [phone]   Fax:  [phone]  Name: Cynthia George MRN: [id number] Date of Birth: [birth year]

## 2019-12-12 ENCOUNTER — Encounter: Payer: Self-pay | Admitting: Physical Therapy

## 2019-12-12 ENCOUNTER — Ambulatory Visit: Payer: No Typology Code available for payment source | Admitting: Physical Therapy

## 2019-12-12 ENCOUNTER — Other Ambulatory Visit: Payer: Self-pay

## 2019-12-12 DIAGNOSIS — M79661 Pain in right lower leg: Secondary | ICD-10-CM | POA: Diagnosis not present

## 2019-12-12 DIAGNOSIS — M6281 Muscle weakness (generalized): Secondary | ICD-10-CM

## 2019-12-12 DIAGNOSIS — R262 Difficulty in walking, not elsewhere classified: Secondary | ICD-10-CM

## 2019-12-12 DIAGNOSIS — M79662 Pain in left lower leg: Secondary | ICD-10-CM

## 2019-12-12 NOTE — Therapy (Signed)
[facility] [address] [place], [address] Phone: [phone]   Fax:  [phone]  Physical Therapy Treatment  Patient Details  Name: Cynthia George MRN: [id number] Date of Birth: [birth year] Referring Provider (PT): Daylene Katayama   Encounter Date: 12/12/2019   PT End of Session - 12/12/19 1739    Visit Number 12    Number of Visits 33    Date for PT Re-Evaluation 02/14/20    Authorization Type [employer] reporting period from 11/28/2019    Authorization Time Period must be coordinated care by patient    Progress Note Due on Visit 10    PT Start Time 1730    PT Stop Time 1810    PT Time Calculation (min) 40 min    Activity Tolerance Patient tolerated treatment well    Behavior During Therapy [facility] for tasks assessed/performed           Past Medical History:  Diagnosis Date  . Anxiety   . Depression   . Hyperlipidemia   . Migraines   . Obesity   . PCOS (polycystic ovarian syndrome)     Past Surgical History:  Procedure Laterality Date  . APPENDECTOMY  2013  . HUMERUS FRACTURE SURGERY Left 2002,2003   rod and screws placed and removed  . TONSILLECTOMY AND ADENOIDECTOMY  2004  . WISDOM TOOTH EXTRACTION  2011   All    There were no vitals filed for this visit.   Subjective Assessment - 12/12/19 1734    Subjective Patient reports she has no pain in the left heel upon arrival but she does have 3/10 pain in the R heel. She did have some pain in the left heel too after working yesterday but she was very busy. She was off today. States her exercises are going well and she was able to progress to the double heel raises without increased pain or stiffness. States she has been counting but not using the clock to count the 5 seconds.    Pertinent History Patient is a 34 y.o. female who presents to outpatient physical therapy with a referral for medical diagnosis left and right achilles tendinigis.  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  Relevant past medical history and comorbidities include morbid obesity, PCOS, HLD, hypothyroidism, migraines. Sx history is tonsils, two surgeries on left arm (humerus - fell as a teenager - no longer bothers her), wisdom teeth, appendectomy. Patient denies hx of cancer, diabetes, hip, knee, back, or neck problems.    Limitations Standing;Walking;House hold activities   work, running, softball   How long can you sit comfortably? no time limit    Diagnostic tests Radiographs reported 02/15/2019: "Radiographs: 3 views of skeletally mature adult foot: There is a posterior heel spur present bilaterally.  With small ossicle/osteophytes.  No plantar heel spur noted.  Accessory spurring noted at the base of the cuboid.  No arthritic changes no other bony deformities noted."    Patient Stated Goals be able to complete her usual activities including return to softball without pain    Currently in Pain? Yes    Pain Score 3  Pain Location Heel    Pain Orientation Right    Pain Onset More than a month ago           TREATMENT:  Therapeutic exercise:to centralize symptoms and improve ROM, strength, muscular endurance, and activity tolerance required for successful completion of functional activities. - single leg heel raise3x10 each side SLOW (5 seconds up, 5 seconds down), with at least 1 min rest(painful during,L no worse after, R mild increase after).  - updated HEP (see below) - Toe Yoga: great toe extension with small toes flexion pressure into floor, 2 sec holds; small toes extension with  great toe flexion pressure into floor, 2 sec holds. x20 each way alternating each rep. Ball of foot and heel maintains contact with floor. To improve intrinsic foot muscle activation and strength in order to better support arch and intrinsic foot structures.  - Toe splays, repeated 2 sec holds. Ball of foot and heel maintains contact with floor. X 20 both feet. To improve intrinsic foot muscle activation and strength in order to better support arch and intrinsic foot structures.  - seated great toe flexion with foot completely flat on floor using red theraband around great toe, 1x30 each side.   HOME EXERCISE PROGRAM Double leg heel raises 3x15 slow(5 seconds up, 5 seconds down), 1 min rest between sets, 3x a week Single leg isometric heel raises 5x 45 secondswith 1 min rest between, 3x a week     PT Education - 12/12/19 1738    Education Details Exercise purpose/form. Self management techniques    Person(s) Educated Patient    Methods Explanation;Demonstration;Tactile cues;Verbal cues    Comprehension Verbalized understanding;Returned demonstration;Verbal cues required;Tactile cues required;Need further instruction            PT Short Term Goals - 11/22/19 1317      PT SHORT TERM GOAL #1   Title Patient will be independent in home exercise program to improve strength/mobility for better functional independence with ADLs.    Baseline initial HEP provided at visit 2 (10/02/2019);    Time 4    Period Weeks    Status Achieved    Target Date 10/25/19             PT Long Term Goals - 11/22/19 1317      PT LONG TERM GOAL #1   Title Demonstrate improved FOTO score to equal or greater than 60 by visit #13 to demonstrate improvement in overall condition and self-reported functional ability.    Baseline 46 (09/27/2019); 50 (11/21/2019);    Time 12    Period Weeks    Status Partially Met   TARGET DATE FOR ALL LONG TERM GOALS: 02/14/2020   Target Date --      PT LONG TERM GOAL #2     Title Patient will report a worst pain of 3/10 on VAS in B ankles/feet to improve tolerance with ADLs and reduced symptoms with activities    Baseline 8/10 R foot, 3/10 left foot (09/27/2019); was staying under 2/10 for about 3 weeks before flairing last thursday to up to 8/10 (11/21/2019);    Time 12    Period Weeks    Status Partially Met      PT LONG TERM GOAL #3   Title Patient will increase BLE gross strength to 4+/5 as to improve functional strength for independent gait, increased standing tolerance and increased ADL ability.    Baseline 4/5 in some planes (09/27/2019); 5/5 seated MMT but unable to do  more than 15 single leg heel raises (11/22/2019);    Time 12    Period Weeks    Status Partially Met      PT LONG TERM GOAL #4   Title Patent will be able to stand for 15 minutes with less than 5/10 pain to complete standing activities    Baseline 8/10 right foot, 3/10 left foot (09/27/2019); pt reports she has been able to stand longer than 15 min with pain no greater than 2/10 for at least three weeks until last Thursday she experienced a flair where her pain goes up to 8/10 (11/21/2019);    Time 12    Period Weeks    Status Partially Met      PT LONG TERM GOAL #5   Title Be independent with a long-term home exercise program for self-management of symptoms.    Baseline Patient currently participating well in appropriate HEP (11/21/2019);    Time 12    Period Weeks    Status New                 Plan - 12/12/19 1823    Clinical Impression Statement Patient tolerated treatment well overall with no overall increase in pain by end of session. Did have some increased discomfort during heel raises R  >L but no worse after, consistent with appropriate response to necessary loading for tendinopathy. Patient would benefit from continued management of limiting condition by skilled physical therapist to address remaining impairments and functional limitations to work towards stated goals and  return to PLOF or maximal functional independence.    Personal Factors and Comorbidities Comorbidity 3+;Fitness;Past/Current Experience;Time since onset of injury/illness/exacerbation    Comorbidities Relevant past medical history and comorbidities include morbid obesity, PCOS, HLD, hypothyroidism, migraines. Sx history is tonsils, two surgeries on left arm (humerus - fell as a teenager - no longer bothers her), wisdom teeth, appendectomy.    Examination-Activity Limitations Locomotion Level;Stairs;Sleep;Stand;Other   running, walking, jumping, leaping, skipping, stairs   Examination-Participation Restrictions Community Activity;Occupation;Interpersonal Relationship   working, transporting and transferring patients, softball   Stability/Clinical Decision Making Stable/Uncomplicated    Rehab Potential Fair    PT Frequency 2x / week    PT Duration 12 weeks    PT Treatment/Interventions Cryotherapy;Electrical Stimulation;Iontophoresis 69m/ml Dexamethasone;Moist Heat;Ultrasound;Gait training;Stair training;Functional mobility training;Therapeutic activities;Therapeutic exercise;Patient/family education;Manual techniques;Passive range of motion;Dry needling;Taping;ADLs/Self Care Home Management;Neuromuscular re-education;Joint Manipulations;Spinal Manipulations    PT Next Visit Plan scaled loading, manual therapy    PT Home Exercise Plan Medbridge Access Code: [email or web address]    Consulted and Agree with Plan of Care Patient           Patient will benefit from skilled therapeutic intervention in order to improve the following deficits and impairments:  Abnormal gait, Decreased activity tolerance, Decreased endurance, Decreased range of motion, Decreased strength, Pain, Obesity, Impaired flexibility, Difficulty walking, Decreased mobility, Impaired perceived functional ability  Visit Diagnosis: Bilateral calf pain  Difficulty in walking, not elsewhere classified  Muscle weakness  (generalized)     Problem List Patient Active Problem List   Diagnosis Date Noted  . Infertility associated with anovulation 06/23/2019  . Major depression, single episode, in complete remission ([place] 06/22/2019  . Postpartum hypertension 11/18/2016  . Gestational hypertension without significant proteinuria 11/01/2016  . Morbid [place] ([facility] 07/27/2016  . Hypothyroid 06/30/2016  . Elevated LFTs 05/27/2016  . HLD (hyperlipidemia)   . Migraines   . PCOS (polycystic ovarian syndrome)     SEverlean Alstrom SGraylon Good PT, DPT 12/12/19, 6:23  PM  [facility] [address] [place], [address] Phone: [phone]   Fax:  [phone]  Name: KALIN AMRHEIN MRN: [id number] Date of Birth: Jun 05, 1985

## 2019-12-18 ENCOUNTER — Ambulatory Visit: Payer: No Typology Code available for payment source | Admitting: Physical Therapy

## 2019-12-19 ENCOUNTER — Ambulatory Visit: Payer: No Typology Code available for payment source | Attending: Podiatry | Admitting: Physical Therapy

## 2019-12-19 ENCOUNTER — Other Ambulatory Visit: Payer: Self-pay

## 2019-12-19 ENCOUNTER — Encounter: Payer: Self-pay | Admitting: Physical Therapy

## 2019-12-19 DIAGNOSIS — R262 Difficulty in walking, not elsewhere classified: Secondary | ICD-10-CM | POA: Diagnosis present

## 2019-12-19 DIAGNOSIS — M79661 Pain in right lower leg: Secondary | ICD-10-CM | POA: Diagnosis not present

## 2019-12-19 DIAGNOSIS — M6281 Muscle weakness (generalized): Secondary | ICD-10-CM | POA: Diagnosis present

## 2019-12-19 DIAGNOSIS — M79662 Pain in left lower leg: Secondary | ICD-10-CM | POA: Diagnosis present

## 2019-12-19 NOTE — Therapy (Signed)
[facility] [address], [place], [address] Phone: [phone]   Fax:  [phone]  Physical Therapy Treatment  Patient Details  Name: SERETHA ESTABROOKS MRN: [id number] Date of Birth: 1985-07-16 Referring Provider (PT): Daylene Katayama   Encounter Date: 12/19/2019   PT End of Session - 12/19/19 1759    Visit Number 13    Number of Visits 33    Date for PT Re-Evaluation 02/14/20    Authorization Type [employer] reporting period from 11/28/2019    Authorization Time Period must be coordinated care by patient    Progress Note Due on Visit 10    PT Start Time 1747    PT Stop Time 1827    PT Time Calculation (min) 40 min    Activity Tolerance Patient tolerated treatment well    Behavior During Therapy [facility] for tasks assessed/performed           Past Medical History:  Diagnosis Date  . Anxiety   . Depression   . Hyperlipidemia   . Migraines   . Obesity   . PCOS (polycystic ovarian syndrome)     Past Surgical History:  Procedure Laterality Date  . APPENDECTOMY  2013  . HUMERUS FRACTURE SURGERY Left 2002,2003   rod and screws placed and removed  . TONSILLECTOMY AND ADENOIDECTOMY  2004  . WISDOM TOOTH EXTRACTION  2011   All    There were no vitals filed for this visit.   Subjective Assessment - 12/19/19 1747    Subjective Patinet reports she is tired today. She worked saturday, sunday, monday, and tuesday 3am to 3pm. Off today. States her heels are not hurting today. Her heels did not do well with so much work and last night her pain was up to 7/10 both sides the same. Slight soreness this morning and painful to get up and walk on them but improved with ROM and elevation. New exercises are going pretty good. She has done them every other day but was able to do 10 reps.    Pertinent History Patient is a 34 y.o. female who presents to outpatient physical therapy with a referral for medical diagnosis  left and right achilles tendinigis. 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  Relevant past medical history and comorbidities include morbid obesity, PCOS, HLD, hypothyroidism, migraines. Sx history is tonsils, two surgeries on left arm (humerus - fell as a teenager - no longer bothers her), wisdom teeth, appendectomy. Patient denies hx of cancer, diabetes, hip, knee, back, or neck problems.    Limitations Standing;Walking;House hold activities   work, running, softball   How long can you sit comfortably? no time limit    Diagnostic tests Radiographs reported 02/15/2019: "Radiographs: 3 views of skeletally mature adult foot: There is a posterior heel spur present bilaterally.  With small ossicle/osteophytes.  No plantar heel spur noted.  Accessory spurring noted at the base of the cuboid.  No arthritic changes no other bony deformities noted."    Patient Stated Goals be able to complete her usual activities including return to softball without pain    Currently in Pain? No/denies  Pain Onset More than a month ago            TREATMENT:  Therapeutic exercise:to centralize symptoms and improve ROM, strength, muscular endurance, and activity tolerance required for successful completion of functional activities. -Single leg isometric heel raises, 5x 45 seconds -Toe Yoga: great toe extension with small toes flexion pressure into floor,2sec holds; small toes extension with great toe flexion pressure into floor, 2sec holds. x30 each way alternating each rep.Ball of foot and heel maintains contact with floor. To improve intrinsic  foot muscle activation and strength in order to better support arch and intrinsic foot structures.  - seated arch lift including MTP, 2secH, x20 each side.  - standing SLS on airex, 3x30 seconds each side, touchdown UE support as needed.  - side stepping with red theraband looped around forefeet, 2x26mn changing directions periodically.  - standing floor arcs (half circles front to back) with one forefoot on furniture slider and support LE with knee slighlty bent, no UE support, x20 each side.   HOME EXERCISE PROGRAM Single leg heel raises 3x15 slow(5 seconds up, 5 seconds down), 1 min rest between sets, 3x a week Single leg isometric heel raises 5x 45 secondswith 1 min rest between, 3x a week    PT Education - 12/19/19 1752    Education Details Exercise purpose/form. Self management techniques    Person(s) Educated Patient    Methods Explanation;Demonstration;Tactile cues;Verbal cues    Comprehension Verbalized understanding;Returned demonstration;Verbal cues required;Tactile cues required;Need further instruction            PT Short Term Goals - 11/22/19 1317      PT SHORT TERM GOAL #1   Title Patient will be independent in home exercise program to improve strength/mobility for better functional independence with ADLs.    Baseline initial HEP provided at visit 2 (10/02/2019);    Time 4    Period Weeks    Status Achieved    Target Date 10/25/19             PT Long Term Goals - 11/22/19 1317      PT LONG TERM GOAL #1   Title Demonstrate improved FOTO score to equal or greater than 60 by visit #13 to demonstrate improvement in overall condition and self-reported functional ability.    Baseline 46 (09/27/2019); 50 (11/21/2019);    Time 12    Period Weeks    Status Partially Met   TARGET DATE FOR ALL LONG TERM GOALS: 02/14/2020   Target Date --      PT LONG TERM GOAL #2   Title Patient will report a worst pain of 3/10 on VAS in B ankles/feet to improve tolerance with  ADLs and reduced symptoms with activities    Baseline 8/10 R foot, 3/10 left foot (09/27/2019); was staying under 2/10 for about 3 weeks before flairing last thursday to up to 8/10 (11/21/2019);    Time 12    Period Weeks    Status Partially Met      PT LONG TERM GOAL #3   Title Patient will increase BLE gross strength to 4+/5 as to improve functional strength for independent gait, increased standing tolerance and increased ADL ability.    Baseline 4/5 in some planes (09/27/2019); 5/5 seated MMT but unable to do more than 15 single leg heel raises (11/22/2019);    Time 12    Period Weeks    Status Partially Met      PT LONG TERM GOAL #4  Title Patent will be able to stand for 15 minutes with less than 5/10 pain to complete standing activities    Baseline 8/10 right foot, 3/10 left foot (09/27/2019); pt reports she has been able to stand longer than 15 min with pain no greater than 2/10 for at least three weeks until last Thursday she experienced a flair where her pain goes up to 8/10 (11/21/2019);    Time 12    Period Weeks    Status Partially Met      PT LONG TERM GOAL #5   Title Be independent with a long-term home exercise program for self-management of symptoms.    Baseline Patient currently participating well in appropriate HEP (11/21/2019);    Time 12    Period Weeks    Status New                 Plan - 12/19/19 1935    Clinical Impression Statement Patient tolerated treatment well with no increase in pain by end of session. Progressed HEP slightly as well as generalized intrinsic foot and ankle strengthening. Patient would benefit from continued management of limiting condition by skilled physical therapist to address remaining impairments and functional limitations to work towards stated goals and return to PLOF or maximal functional independence.    Personal Factors and Comorbidities Comorbidity 3+;Fitness;Past/Current Experience;Time since onset of injury/illness/exacerbation      Comorbidities Relevant past medical history and comorbidities include morbid obesity, PCOS, HLD, hypothyroidism, migraines. Sx history is tonsils, two surgeries on left arm (humerus - fell as a teenager - no longer bothers her), wisdom teeth, appendectomy.    Examination-Activity Limitations Locomotion Level;Stairs;Sleep;Stand;Other   running, walking, jumping, leaping, skipping, stairs   Examination-Participation Restrictions Community Activity;Occupation;Interpersonal Relationship   working, transporting and transferring patients, softball   Stability/Clinical Decision Making Stable/Uncomplicated    Rehab Potential Fair    PT Frequency 2x / week    PT Duration 12 weeks    PT Treatment/Interventions Cryotherapy;Electrical Stimulation;Iontophoresis 34m/ml Dexamethasone;Moist Heat;Ultrasound;Gait training;Stair training;Functional mobility training;Therapeutic activities;Therapeutic exercise;Patient/family education;Manual techniques;Passive range of motion;Dry needling;Taping;ADLs/Self Care Home Management;Neuromuscular re-education;Joint Manipulations;Spinal Manipulations    PT Next Visit Plan scaled loading, manual therapy    PT Home Exercise Plan Medbridge Access Code: [email or web address]    Consulted and Agree with Plan of Care Patient           Patient will benefit from skilled therapeutic intervention in order to improve the following deficits and impairments:  Abnormal gait, Decreased activity tolerance, Decreased endurance, Decreased range of motion, Decreased strength, Pain, Obesity, Impaired flexibility, Difficulty walking, Decreased mobility, Impaired perceived functional ability  Visit Diagnosis: Bilateral calf pain  Difficulty in walking, not elsewhere classified  Muscle weakness (generalized)     Problem List Patient Active Problem List   Diagnosis Date Noted  . Infertility associated with anovulation 06/23/2019  . Major depression, single episode, in complete remission ([place]  06/22/2019  . Postpartum hypertension 11/18/2016  . Gestational hypertension without significant proteinuria 11/01/2016  . Morbid obesity ([place] 07/27/2016  . Hypothyroid 06/30/2016  . Elevated LFTs 05/27/2016  . HLD (hyperlipidemia)   . Migraines   . PCOS (polycystic ovarian syndrome)     SEverlean Alstrom SGraylon Good PT, DPT 12/19/19, 7:36 PM  [facility] [address] [place] [address]Phone: [phone]  Fax:  [phone] Name: TANIYHA TATEMRN: [id number] of Birth: [birth year]

## 2019-12-26 ENCOUNTER — Ambulatory Visit (INDEPENDENT_AMBULATORY_CARE_PROVIDER_SITE_OTHER): Payer: No Typology Code available for payment source | Admitting: Obstetrics and Gynecology

## 2019-12-26 ENCOUNTER — Other Ambulatory Visit: Payer: Self-pay

## 2019-12-26 ENCOUNTER — Encounter: Payer: Self-pay | Admitting: Physical Therapy

## 2019-12-26 ENCOUNTER — Encounter: Payer: Self-pay | Admitting: Obstetrics and Gynecology

## 2019-12-26 ENCOUNTER — Ambulatory Visit: Payer: No Typology Code available for payment source | Admitting: Physical Therapy

## 2019-12-26 VITALS — BP 130/80 | Ht 63.0 in | Wt 250.0 lb

## 2019-12-26 DIAGNOSIS — M79661 Pain in right lower leg: Secondary | ICD-10-CM | POA: Diagnosis not present

## 2019-12-26 DIAGNOSIS — M79662 Pain in left lower leg: Secondary | ICD-10-CM

## 2019-12-26 DIAGNOSIS — M6281 Muscle weakness (generalized): Secondary | ICD-10-CM

## 2019-12-26 DIAGNOSIS — Z01419 Encounter for gynecological examination (general) (routine) without abnormal findings: Secondary | ICD-10-CM

## 2019-12-26 DIAGNOSIS — Z1239 Encounter for other screening for malignant neoplasm of breast: Secondary | ICD-10-CM | POA: Diagnosis not present

## 2019-12-26 DIAGNOSIS — R262 Difficulty in walking, not elsewhere classified: Secondary | ICD-10-CM

## 2019-12-26 NOTE — Progress Notes (Signed)
Gynecology Annual Exam   PCP: Valerie Roys, DO  Chief Complaint:  Chief Complaint  Patient presents with  . Gynecologic Exam    History of Present Illness: Patient is a 34 y.o. G1P1001 presents for annual exam. The patient has no complaints today.   LMP: Patient's last menstrual period was 12/03/2019. Regular monthly currently undergoing ovulation induction with trigger shots and letrozole.   The patient is sexually active. She currently uses none for contraception. She denies dyspareunia.  The patient does perform self breast exams.  There is no notable family history of breast or ovarian cancer in her family.  The patient wears seatbelts: yes.   The patient has regular exercise: not asked.    The patient denies current symptoms of depression.    Review of Systems: Review of Systems  Constitutional: Negative for chills and fever.  HENT: Negative for congestion.   Respiratory: Negative for cough and shortness of breath.   Cardiovascular: Negative for chest pain and palpitations.  Gastrointestinal: Negative for abdominal pain, constipation, diarrhea, heartburn, nausea and vomiting.  Genitourinary: Negative for dysuria, frequency and urgency.  Skin: Negative for itching and rash.  Neurological: Negative for dizziness and headaches.  Endo/Heme/Allergies: Negative for polydipsia.  Psychiatric/Behavioral: Negative for depression.    Past Medical History:  Patient Active Problem List   Diagnosis Date Noted  . Infertility associated with anovulation 06/23/2019  . Major depression, single episode, in complete remission ([place]) 06/22/2019  . Postpartum hypertension 11/18/2016  . Gestational hypertension without significant proteinuria 11/01/2016  . Morbid obesity ([place]) 07/27/2016    BMI>40. Early 1 hr GTT 148 with normal 3hr GTT. Need repeat 3 hour at 28 weeks groth scans q4 weeks in third trimester. Weekly antepartum testing at 36 weeks.   . Hypothyroid 06/30/2016  .  Elevated LFTs 05/27/2016  . HLD (hyperlipidemia)   . Migraines   . PCOS (polycystic ovarian syndrome)     Past Surgical History:  Past Surgical History:  Procedure Laterality Date  . APPENDECTOMY  2013  . HUMERUS FRACTURE SURGERY Left 2002,2003   rod and screws placed and removed  . TONSILLECTOMY AND ADENOIDECTOMY  2004  . WISDOM TOOTH EXTRACTION  2011   All    Gynecologic History:  Patient's last menstrual period was 12/03/2019. Contraception: none Last Pap: Results were:11/24/2018 no abnormalities   Obstetric History: G1P1001  Family History:  Family History  Problem Relation Age of Onset  . Heart disease Mother   . Hypertension Mother   . Hyperlipidemia Mother   . Heart attack Mother   . Hyperlipidemia Father   . Hypertension Father   . Diabetes Father   . Asthma Father   . Heart attack Father 27  . Hyperlipidemia Sister   . Asthma Sister   . Polycystic ovary syndrome Sister   . Mental illness Sister        Depression  . Mental illness Brother        Depression  . Diabetes Maternal Grandmother   . Hyperlipidemia Maternal Grandmother   . Hypertension Maternal Grandmother   . Diabetes Maternal Grandfather   . Hypertension Maternal Grandfather   . Heart attack Maternal Grandfather   . Diabetes Paternal Grandmother   . Heart attack Paternal Grandfather     Social History:  Social History   Socioeconomic History  . Marital status: Married    Spouse name: Not on file  . Number of children: Not on file  . Years of education: Not on  file  . Highest education level: Not on file  Occupational History  . Not on file  Tobacco Use  . Smoking status: Never Smoker  . Smokeless tobacco: Never Used  Vaping Use  . Vaping Use: Never used  Substance and Sexual Activity  . Alcohol use: No  . Drug use: No  . Sexual activity: Yes    Birth control/protection: None  Other Topics Concern  . Not on file  Social History Narrative  . Not on file   Social  Determinants of Health   Financial Resource Strain:   . Difficulty of Paying Living Expenses: Not on file  Food Insecurity:   . Worried About Charity fundraiser in the Last Year: Not on file  . Ran Out of Food in the Last Year: Not on file  Transportation Needs:   . Lack of Transportation (Medical): Not on file  . Lack of Transportation (Non-Medical): Not on file  Physical Activity:   . Days of Exercise per Week: Not on file  . Minutes of Exercise per Session: Not on file  Stress:   . Feeling of Stress : Not on file  Social Connections:   . Frequency of Communication with Friends and Family: Not on file  . Frequency of Social Gatherings with Friends and Family: Not on file  . Attends Religious Services: Not on file  . Active Member of Clubs or Organizations: Not on file  . Attends Archivist Meetings: Not on file  . Marital Status: Not on file  Intimate Partner Violence:   . Fear of Current or Ex-Partner: Not on file  . Emotionally Abused: Not on file  . Physically Abused: Not on file  . Sexually Abused: Not on file    Allergies:  Allergies  Allergen Reactions  . Flagyl [Metronidazole] Rash  . Keflex [Cephalexin] Rash  . Septra [Sulfamethoxazole-Trimethoprim] Rash    Medications: Prior to Admission medications   Medication Sig Start Date End Date Taking? Authorizing Provider  letrozole [facility]) 2.5 MG tablet Take by mouth. 12/04/19  Yes [provider]  levothyroxine (SYNTHROID) 100 MCG tablet TAKE 1 TABLET BY MOUTH DAILY BEFORE BREAKFAST. 11/16/19  Yes Johnson, Megan P, DO  medroxyPROGESTERone (PROVERA) 10 MG tablet Take 1 tablet (10 mg total) by mouth daily. Use for ten days 01/30/19  Yes Malachy Mood, MD  meloxicam (MOBIC) 15 MG tablet Take 1 tablet (15 mg total) by mouth daily. 08/28/19  Yes Edrick Kins, DPM  metFORMIN (GLUCOPHAGE-XR) 500 MG 24 hr tablet Take 2 tablets (1,000 mg total) by mouth daily with breakfast. 06/22/19  Yes Johnson, Megan  P, DO  OVIDREL 250 MCG/0.5ML injection Inject into the skin. 12/04/19  Yes [provider]    Physical Exam Vitals: Blood pressure 130/80, height _0  (1.6 m), weight 250 lb (113.4 kg), last menstrual period 12/03/2019. Body mass index is 44.29 kg/m.   General: NAD HEENT: normocephalic, anicteric Thyroid: no enlargement, no palpable nodules Pulmonary: No increased work of breathing, CTAB Cardiovascular: RRR, distal pulses 2+ Breast: Breast symmetrical, no tenderness, no palpable nodules or masses, no skin or nipple retraction present, no nipple discharge.  No axillary or supraclavicular lymphadenopathy. Abdomen: NABS, soft, non-tender, non-distended.  Umbilicus without lesions.  No hepatomegaly, splenomegaly or masses palpable. No evidence of hernia  Genitourinary:  External: Normal external female genitalia.  Normal urethral meatus, normal Bartholin's and Skene's glands.    Vagina: Normal vaginal mucosa, no evidence of prolapse.    Cervix: Grossly normal  in appearance, no bleeding  Uterus: Non-enlarged, mobile, normal contour.  No CMT  Adnexa: ovaries non-enlarged, no adnexal masses  Rectal: deferred  Lymphatic: no evidence of inguinal lymphadenopathy Extremities: no edema, erythema, or tenderness Neurologic: Grossly intact Psychiatric: mood appropriate, affect full  Female chaperone present for pelvic and breast  portions of the physical exam  Immunization History  Administered Date(s) Administered  . Hepatitis B 01/20/1998, 02/17/1998, 07/21/1998  . Influenza Nasal 01/30/2018  . Influenza,inj,Quad PF,6+ Mos 12/29/2016  . Influenza-Unspecified 12/01/2012, 01/16/2015, 01/18/2016, 02/13/2019  . MMR 05/19/2010, 06/16/2010  . PFIZER SARS-COV-2 Vaccination 12/08/2019  . Tdap 05/19/2010, 09/29/2016     Assessment: 34 y.o. G1P1001 routine annual exam  Plan: Problem List Items Addressed This Visit    None    Visit Diagnoses    Encounter for gynecological  examination without abnormal finding    -  Primary   Breast screening          1) STI screening  was notoffered and therefore not obtained  2)  ASCCP guidelines and rational discussed.  Patient opts for every 3 years screening interval  3) Contraception - the patient is currently using  none.  She is attempting to conceive in the near future  4) Routine healthcare maintenance including cholesterol, diabetes screening discussed managed by PCP  5) Return in about 1 year (around 12/25/2020) for annual.   Malachy Mood, MD, [place], [facility] 12/26/2019, 8:38 AM

## 2019-12-26 NOTE — Therapy (Signed)
[facility] [address] [place], [address] Phone: [phone]   Fax:  [phone]  Physical Therapy Treatment  Patient Details  Name: Cynthia George MRN: [id number] Date of Birth: 08-Jun-1985 Referring Provider (PT): Daylene Katayama   Encounter Date: 12/26/2019   PT End of Session - 12/26/19 1852    Visit Number 14    Number of Visits 33    Date for PT Re-Evaluation 02/14/20    Authorization Type [employer] reporting period from 11/28/2019    Authorization Time Period must be coordinated care by patient    Progress Note Due on Visit 10    PT Start Time 1816    PT Stop Time 1856    PT Time Calculation (min) 40 min    Activity Tolerance Patient tolerated treatment well;No increased pain    Behavior During Therapy WFL for tasks assessed/performed           Past Medical History:  Diagnosis Date   Anxiety    Depression    Hyperlipidemia    Migraines    Obesity    PCOS (polycystic ovarian syndrome)     Past Surgical History:  Procedure Laterality Date   APPENDECTOMY  2013   HUMERUS FRACTURE SURGERY Left 2002,2003   rod and screws placed and removed   TONSILLECTOMY AND ADENOIDECTOMY  2004   WISDOM TOOTH EXTRACTION  2011   All    There were no vitals filed for this visit.   Subjective Assessment - 12/26/19 1820    Subjective Patient reports she had to take 3 days off of work due to feeling inflamed at R heel. She took meloxicam in the evening and this has helped. Her current pain is 2/10 R heel and no pain on left. Her last visit here was Wednesday and she does not remember how she felt on thursday. Friday was when the pain was really bad. This week she worked sunday, monday, tuesday. She did not do her heel raises exercise. She did a little bit of B isometric exercise on saturday.    Pertinent History Patient is a 34 y.o. female who presents to outpatient physical therapy with a  referral for medical diagnosis left and right achilles tendinigis. 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  Relevant past medical history and comorbidities include morbid obesity, PCOS, HLD, hypothyroidism, migraines. Sx history is tonsils, two surgeries on left arm (humerus - fell as a teenager - no longer bothers her), wisdom teeth, appendectomy. Patient denies hx of cancer, diabetes, hip, knee, back, or neck problems.    Limitations Standing;Walking;House hold activities   work, running, softball   How long can you sit comfortably? no time limit    Diagnostic tests Radiographs reported 02/15/2019: "Radiographs: 3 views of skeletally mature adult foot: There is a posterior heel spur present bilaterally.  With small ossicle/osteophytes.  No plantar heel spur noted.  Accessory spurring noted at the base of the cuboid.  No arthritic changes no other bony deformities noted."    Patient Stated Goals be able to complete her usual activities including return to softball without pain    Currently in  Pain? Yes    Pain Score 2     Pain Location Heel    Pain Orientation Right    Pain Descriptors / Indicators Burning    Pain Type Chronic pain    Pain Onset More than a month ago           TREATMENT:  Therapeutic exercise:to centralize symptoms and improve ROM, strength, muscular endurance, and activity tolerance required for successful completion of functional activities. -L only single leg isometric heel raises, 5x 45 seconds -Toe Yoga: great toe extension with small toes flexion pressure into floor,2sec holds; small toes extension  with great toe flexion pressure into floor, 2sec holds. x30 each way alternating each rep.Ball of foot and heel maintains contact with floor. To improve intrinsic foot muscle activation and strength in order to better support arch and intrinsic foot structures.  - seated arch lift including MTP, 2secH, x30 each side.  - seated great toe flexion with foot completely flat on floor using red theraband around great toe, 1x30 each side. - updated HEP to regression to accommodate increased pain with plan for gradual increase in load again.   HOME EXERCISE PROGRAM Access Code: [email or web address] URL: [email or web address] Date: 12/26/2019 Prepared by: Rosita Kea  Exercises Seated Arch Lifts - 1 x daily - 30 reps - 3 seconds hold     PT Education - 12/26/19 1852    Education Details Exercise purpose/form. Self management techniques    Person(s) Educated Patient    Methods Explanation;Demonstration;Tactile cues;Verbal cues    Comprehension Verbalized understanding;Returned demonstration;Verbal cues required;Tactile cues required;Need further instruction            PT Short Term Goals - 11/22/19 1317      PT SHORT TERM GOAL #1   Title Patient will be independent in home exercise program to improve strength/mobility for better functional independence with ADLs.    Baseline initial HEP provided at visit 2 (10/02/2019);    Time 4    Period Weeks    Status Achieved    Target Date 10/25/19             PT Long Term Goals - 11/22/19 1317      PT LONG TERM GOAL #1   Title Demonstrate improved FOTO score to equal or greater than 60 by visit #13 to demonstrate improvement in overall condition and self-reported functional ability.    Baseline 46 (09/27/2019); 50 (11/21/2019);    Time 12    Period Weeks    Status Partially Met   TARGET DATE FOR ALL LONG TERM GOALS: 02/14/2020   Target Date --      PT LONG TERM GOAL #2   Title Patient will report a worst pain of 3/10 on VAS in B  ankles/feet to improve tolerance with ADLs and reduced symptoms with activities    Baseline 8/10 R foot, 3/10 left foot (09/27/2019); was staying under 2/10 for about 3 weeks before flairing last thursday to up to 8/10 (11/21/2019);    Time 12    Period Weeks    Status Partially Met      PT LONG TERM GOAL #3   Title Patient will increase BLE gross strength to 4+/5 as to improve functional strength for independent gait, increased standing tolerance and increased ADL ability.    Baseline 4/5 in some planes (09/27/2019); 5/5 seated MMT but unable to do more than 15 single leg heel raises (11/22/2019);    Time 12    Period Weeks  Status Partially Met      PT LONG TERM GOAL #4   Title Patent will be able to stand for 15 minutes with less than 5/10 pain to complete standing activities    Baseline 8/10 right foot, 3/10 left foot (09/27/2019); pt reports she has been able to stand longer than 15 min with pain no greater than 2/10 for at least three weeks until last Thursday she experienced a flair where her pain goes up to 8/10 (11/21/2019);    Time 12    Period Weeks    Status Partially Met      PT LONG TERM GOAL #5   Title Be independent with a long-term home exercise program for self-management of symptoms.    Baseline Patient currently participating well in appropriate HEP (11/21/2019);    Time 12    Period Weeks    Status New                 Plan - 12/26/19 1957    Clinical Impression Statement Patient tolerated treatment well with no further aggravation of symptoms during or following exercises. Continued with tolerated foot strengthening. Patient does show improvements in ability to activate intrinsic foot musculature but does still lack control and strength, R > L. Modified HEP due to patient's episode of increased pain in R heel. Patient would benefit from continued management of limiting condition by skilled physical therapist to address remaining impairments and functional limitations  to work towards stated goals and return to PLOF or maximal functional independence.    Personal Factors and Comorbidities Comorbidity 3+;Fitness;Past/Current Experience;Time since onset of injury/illness/exacerbation    Comorbidities Relevant past medical history and comorbidities include morbid obesity, PCOS, HLD, hypothyroidism, migraines. Sx history is tonsils, two surgeries on left arm (humerus - fell as a teenager - no longer bothers her), wisdom teeth, appendectomy.    Examination-Activity Limitations Locomotion Level;Stairs;Sleep;Stand;Other   running, walking, jumping, leaping, skipping, stairs   Examination-Participation Restrictions Community Activity;Occupation;Interpersonal Relationship   working, transporting and transferring patients, softball   Stability/Clinical Decision Making Stable/Uncomplicated    Rehab Potential Fair    PT Frequency 2x / week    PT Duration 12 weeks    PT Treatment/Interventions Cryotherapy;Electrical Stimulation;Iontophoresis 19m/ml Dexamethasone;Moist Heat;Ultrasound;Gait training;Stair training;Functional mobility training;Therapeutic activities;Therapeutic exercise;Patient/family education;Manual techniques;Passive range of motion;Dry needling;Taping;ADLs/Self Care Home Management;Neuromuscular re-education;Joint Manipulations;Spinal Manipulations    PT Next Visit Plan scaled loading, manual therapy    PT Home Exercise Plan Medbridge Access Code: [email or web address]    Consulted and Agree with Plan of Care Patient           Patient will benefit from skilled therapeutic intervention in order to improve the following deficits and impairments:  Abnormal gait, Decreased activity tolerance, Decreased endurance, Decreased range of motion, Decreased strength, Pain, Obesity, Impaired flexibility, Difficulty walking, Decreased mobility, Impaired perceived functional ability  Visit Diagnosis: Bilateral calf pain  Difficulty in walking, not elsewhere classified  Muscle  weakness (generalized)     Problem List Patient Active Problem List   Diagnosis Date Noted   Infertility associated with anovulation 06/23/2019   [place] 06/22/2019   Postpartum hypertension 11/18/2016   Gestational hypertension without significant proteinuria 11/01/2016   Morbid [place] ([place] 07/27/2016   Hypothyroid 06/30/2016   Elevated LFTs 05/27/2016   HLD (hyperlipidemia)    Migraines    PCOS (polycystic ovarian syndrome)    SClarise CruzR. SGraylon Good PT, DPT 12/26/19, 7:59 PM  [facility] [address], [place], [address] Phone: [phone]   Fax:  [phone]  Name: LAURALIE BLACKSHER MRN: [id number] Date of Birth: July 25, 1985

## 2020-01-01 ENCOUNTER — Ambulatory Visit: Payer: No Typology Code available for payment source | Admitting: Physical Therapy

## 2020-01-03 ENCOUNTER — Ambulatory Visit: Payer: No Typology Code available for payment source | Admitting: Physical Therapy

## 2020-01-03 ENCOUNTER — Encounter: Payer: Self-pay | Admitting: Physical Therapy

## 2020-01-03 ENCOUNTER — Other Ambulatory Visit: Payer: Self-pay

## 2020-01-03 DIAGNOSIS — R262 Difficulty in walking, not elsewhere classified: Secondary | ICD-10-CM

## 2020-01-03 DIAGNOSIS — M79661 Pain in right lower leg: Secondary | ICD-10-CM

## 2020-01-03 DIAGNOSIS — M6281 Muscle weakness (generalized): Secondary | ICD-10-CM

## 2020-01-03 NOTE — Therapy (Signed)
[facility] [address], [place], [address] Phone: [phone]   Fax:  [phone]  Physical Therapy Treatment  Patient Details  Name: Cynthia George MRN: [id number] Date of Birth: [birth year] Referring Provider (PT): Daylene Katayama   Encounter Date: 01/03/2020   PT End of Session - 01/03/20 1933    Visit Number 15    Number of Visits 33    Date for PT Re-Evaluation 02/14/20    Authorization Type [employer] reporting period from 11/28/2019    Authorization Time Period must be coordinated care by patient    Progress Note Due on Visit 10    PT Start Time 1835    PT Stop Time 1915    PT Time Calculation (min) 40 min    Activity Tolerance Patient tolerated treatment well;No increased pain    Behavior During Therapy WFL for tasks assessed/performed           Past Medical History:  Diagnosis Date  . Anxiety   . Depression   . Hyperlipidemia   . Migraines   . Obesity   . PCOS (polycystic ovarian syndrome)     Past Surgical History:  Procedure Laterality Date  . APPENDECTOMY  2013  . HUMERUS FRACTURE SURGERY Left 2002,2003   rod and screws placed and removed  . TONSILLECTOMY AND ADENOIDECTOMY  2004  . WISDOM TOOTH EXTRACTION  2011   All    There were no vitals filed for this visit.   Subjective Assessment - 01/03/20 1848    Subjective Patient report she has had a hard week. Her R foot did not hurt more than before following last treatment session but it has been very sore the last week including walking a short distance to an event on Saturday and down a bout 2 stairs (she takes stairs at work as well). Is thinking she should go back to Dr. Amalia Hailey since it is not getting better. Plans to call him in the morning. Rates R heel pain 2-3/10 after resting today (not working). Has been 6-7/10 pretty consistently over the last week. Left heel has not been hurting today. The past week has been a 5-6/10  pain in the left. Has been able to do the 45 second single leg holds on the left and nothing on the right the past week as far as HEP.    Pertinent History Patient is a 34 y.o. female who presents to outpatient physical therapy with a referral for medical diagnosis left and right achilles tendinigis. 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  Relevant past medical history and comorbidities include morbid obesity, PCOS, HLD, hypothyroidism, migraines. Sx history is tonsils, two surgeries on left arm (humerus - fell as a teenager - no longer bothers her), wisdom teeth, appendectomy. Patient denies hx of cancer, diabetes, hip, knee, back, or neck problems.    Limitations Standing;Walking;House hold activities   work, running, softball   How long can you sit comfortably? no time limit    Diagnostic tests Radiographs reported 02/15/2019: "Radiographs: 3 views of skeletally mature adult foot: There is a posterior heel spur present bilaterally.  With small ossicle/osteophytes.  No plantar heel spur noted.  Accessory spurring noted at the base of the cuboid.  No arthritic changes no other bony deformities noted."    Patient Stated Goals be able to complete her usual activities including return to softball without pain    Currently in Pain? Yes    Pain Score 3     Pain Onset More than a month ago           TREATMENT:  Therapeutic exercise:to centralize symptoms and improve ROM, strength, muscular endurance, and activity tolerance required for successful completion of functional activities. -L only single leg isometric heel raises,5x 45  seconds -Toe Yoga: great toe extension with small toes flexion pressure into floor,2sec holds; small toes extension with great toe flexion pressure into floor, 2sec holds. x30each way alternating each rep.Ball of foot and heel maintains contact with floor. To improve intrinsic foot muscle activation and strength in order to better support arch and intrinsic foot structures. - seated arch lift including MTP, 2secH, x30 each side.  - seated great toe flexion with foot completely flat on floor using red theraband around great toe, 1x30 each side.  Manual therapy: to reduce pain and tissue tension, improve range of motion, neuromodulation, in order to promote improved ability to complete functional activities. - prone STM to bilateral tricep surae to decreased tension and pain.   Pt required multimodal cuing for proper technique and to facilitate improved neuromuscular control, strength, range of motion, and functional ability resulting in improved performance and form.   HOME EXERCISE PROGRAM  Left Single leg isometric heel raises 5x 45 secondswith 1 min rest between, 1x a day  Double Leg isometric heel raises (when able, for R side) 5x 45 secondswith 1 min rest between, 1x a day  Okay to have pain during exercise, but it should resolve within 30 min after completing exercise and be no worse the next day.        PT Education - 01/03/20 1933    Education Details Exercise purpose/form. Self management techniques    Person(s) Educated Patient    Methods Explanation;Demonstration;Tactile cues;Verbal cues    Comprehension Verbalized understanding;Returned demonstration;Verbal cues required;Tactile cues required;Need further instruction            PT Short Term Goals - 11/22/19 1317      PT SHORT TERM GOAL #1   Title Patient will be independent in home exercise program to improve strength/mobility for better functional independence with ADLs.    Baseline initial HEP  provided at visit 2 (10/02/2019);    Time 4    Period Weeks    Status Achieved    Target Date 10/25/19             PT Long Term Goals - 11/22/19 1317      PT LONG TERM GOAL #1   Title Demonstrate improved FOTO score to equal or greater than 60 by visit #13 to demonstrate improvement in overall condition and self-reported functional ability.    Baseline 46 (09/27/2019); 50 (11/21/2019);    Time 12    Period Weeks    Status Partially Met   TARGET DATE FOR ALL LONG TERM GOALS: 02/14/2020   Target Date --      PT LONG TERM GOAL #2   Title Patient will report a worst pain of 3/10 on VAS in B ankles/feet to improve tolerance with ADLs and reduced symptoms with activities    Baseline 8/10 R foot, 3/10 left foot (09/27/2019); was staying under  2/10 for about 3 weeks before flairing last thursday to up to 8/10 (11/21/2019);    Time 12    Period Weeks    Status Partially Met      PT LONG TERM GOAL #3   Title Patient will increase BLE gross strength to 4+/5 as to improve functional strength for independent gait, increased standing tolerance and increased ADL ability.    Baseline 4/5 in some planes (09/27/2019); 5/5 seated MMT but unable to do more than 15 single leg heel raises (11/22/2019);    Time 12    Period Weeks    Status Partially Met      PT LONG TERM GOAL #4   Title Patent will be able to stand for 15 minutes with less than 5/10 pain to complete standing activities    Baseline 8/10 right foot, 3/10 left foot (09/27/2019); pt reports she has been able to stand longer than 15 min with pain no greater than 2/10 for at least three weeks until last Thursday she experienced a flair where her pain goes up to 8/10 (11/21/2019);    Time 12    Period Weeks    Status Partially Met      PT LONG TERM GOAL #5   Title Be independent with a long-term home exercise program for self-management of symptoms.    Baseline Patient currently participating well in appropriate HEP (11/21/2019);    Time 12     Period Weeks    Status New                 Plan - 01/03/20 1932    Clinical Impression Statement Patient has attended 15 physical therapy treatment sessions and initially made good steady progress towards goals with good tolerance for gradually increasing load. However, she was unable to progress to loaded single leg raises and experienced a flair in symptoms R > L so exercises were regressed back to isometrics and gradually progressed again with more attention to tempo with slow heavy loading. She progressed again but was unable to transition to single leg heel lifts past 10 reps this time despite good attention to tempo (using a clock to count seconds). Appears to have very good participation to HEP as prescribed and sufficient time has passed that she should be getting more lasting results at this point. Reports this past week her R heel has been very bothersome and worse so she has been unable to perform her HEP, but the left has not been as bad. She has been able to continue single leg isometric holds on the left. Patient would benefit from follow up with her referring physician due to difficulty with progress in PT at this point and agreed to contact him tomorrow for a follow up. Patient would benefit from continued management of limiting condition by skilled physical therapist to address remaining impairments and functional limitations to work towards stated goals and return to PLOF or maximal functional independence.    Personal Factors and Comorbidities Comorbidity 3+;Fitness;Past/Current Experience;Time since onset of injury/illness/exacerbation    Comorbidities Relevant past medical history and comorbidities include morbid obesity, PCOS, HLD, hypothyroidism, migraines. Sx history is tonsils, two surgeries on left arm (humerus - fell as a teenager - no longer bothers her), wisdom teeth, appendectomy.    Examination-Activity Limitations Locomotion Level;Stairs;Sleep;Stand;Other   running,  walking, jumping, leaping, skipping, stairs   Examination-Participation Restrictions Community Activity;Occupation;Interpersonal Relationship   working, transporting and transferring patients, softball   Stability/Clinical Decision Making Stable/Uncomplicated    Rehab  Potential Fair    PT Frequency 2x / week    PT Duration 12 weeks    PT Treatment/Interventions Cryotherapy;Electrical Stimulation;Iontophoresis 12m/ml Dexamethasone;Moist Heat;Ultrasound;Gait training;Stair training;Functional mobility training;Therapeutic activities;Therapeutic exercise;Patient/family education;Manual techniques;Passive range of motion;Dry needling;Taping;ADLs/Self Care Home Management;Neuromuscular re-education;Joint Manipulations;Spinal Manipulations    PT Next Visit Plan scaled loading, manual therapy    PT Home Exercise Plan Medbridge Access Code: [email or web address]   Recommended Other Services return to referring physician to discuss difficulty in progress    Consulted and Agree with Plan of Care Patient           Patient will benefit from skilled therapeutic intervention in order to improve the following deficits and impairments:  Abnormal gait, Decreased activity tolerance, Decreased endurance, Decreased range of motion, Decreased strength, Pain, Obesity, Impaired flexibility, Difficulty walking, Decreased mobility, Impaired perceived functional ability  Visit Diagnosis: Bilateral calf pain  Difficulty in walking, not elsewhere classified  Muscle weakness (generalized)     Problem List Patient Active Problem List   Diagnosis Date Noted  . Infertility associated with anovulation 06/23/2019  . Major depression, single episode, in complete remission ([facility] 06/22/2019  . Postpartum hypertension 11/18/2016  . Gestational hypertension without significant proteinuria 11/01/2016  . Morbid obesity ([facility] 07/27/2016  . Hypothyroid 06/30/2016  . Elevated LFTs 05/27/2016  . HLD (hyperlipidemia)   . Migraines     . PCOS (polycystic ovarian syndrome)     SEverlean Alstrom SGraylon Good PT, DPT 01/03/20, 7:36 PM  [facility] [address] [place] [address]Phone: [phone]  Fax:  [phone] Name: Cynthia RACZYNSKIMRN: [id number] of Birth: 306/21/1987

## 2020-01-10 ENCOUNTER — Ambulatory Visit: Payer: No Typology Code available for payment source | Admitting: Physical Therapy

## 2020-01-11 ENCOUNTER — Ambulatory Visit (INDEPENDENT_AMBULATORY_CARE_PROVIDER_SITE_OTHER): Payer: No Typology Code available for payment source | Admitting: Podiatry

## 2020-01-11 ENCOUNTER — Other Ambulatory Visit: Payer: Self-pay

## 2020-01-11 DIAGNOSIS — M79672 Pain in left foot: Secondary | ICD-10-CM

## 2020-01-11 DIAGNOSIS — M7662 Achilles tendinitis, left leg: Secondary | ICD-10-CM | POA: Diagnosis not present

## 2020-01-11 DIAGNOSIS — M79671 Pain in right foot: Secondary | ICD-10-CM

## 2020-01-11 DIAGNOSIS — M7661 Achilles tendinitis, right leg: Secondary | ICD-10-CM

## 2020-01-11 NOTE — Patient Instructions (Signed)
Pre-Operative Instructions  Congratulations, you have decided to take an important step towards improving your quality of life.  You can be assured that the doctors and staff at [facility] will be with you every step of the way.  Here are some important things you should know:  1. Plan to be at the surgery center/hospital at least 1 (one) hour prior to your scheduled time, unless otherwise directed by the surgical center/hospital staff.  You must have a responsible adult accompany you, remain during the surgery and drive you home.  Make sure you have directions to the surgical center/hospital to ensure you arrive on time. 2. If you are having surgery at [facility] or [facility], you will need a copy of your medical history and physical form from your family physician within one month prior to the date of surgery. We will give you a form for your primary physician to complete.  3. We make every effort to accommodate the date you request for surgery.  However, there are times where surgery dates or times have to be moved.  We will contact you as soon as possible if a change in schedule is required.   4. No aspirin/ibuprofen for one week before surgery.  If you are on aspirin, any non-steroidal anti-inflammatory medications (Mobic, Aleve, Ibuprofen) should not be taken seven (7) days prior to your surgery.  You make take Tylenol for pain prior to surgery.  5. Medications - If you are taking daily heart and blood pressure medications, seizure, reflux, allergy, asthma, anxiety, pain or diabetes medications, make sure you notify the surgery center/hospital before the day of surgery so they can tell you which medications you should take or avoid the day of surgery. 6. No food or drink after midnight the night before surgery unless directed otherwise by surgical center/hospital staff. 7. No alcoholic beverages 24-hours prior to surgery.  No smoking 24-hours prior or 24-hours after  surgery. 8. Wear loose pants or shorts. They should be loose enough to fit over bandages, boots, and casts. 9. Don't wear slip-on shoes. Sneakers are preferred. 10. Bring your boot with you to the surgery center/hospital.  Also bring crutches or a walker if your physician has prescribed it for you.  If you do not have this equipment, it will be provided for you after surgery. 11. If you have not been contacted by the surgery center/hospital by the day before your surgery, call to confirm the date and time of your surgery. 12. Leave-time from work may vary depending on the type of surgery you have.  Appropriate arrangements should be made prior to surgery with your employer. 13. Prescriptions will be provided immediately following surgery by your doctor.  Fill these as soon as possible after surgery and take the medication as directed. Pain medications will not be refilled on weekends and must be approved by the doctor. 14. Remove nail polish on the operative foot and avoid getting pedicures prior to surgery. 15. Wash the night before surgery.  The night before surgery wash the foot and leg well with water and the antibacterial soap provided. Be sure to pay special attention to beneath the toenails and in between the toes.  Wash for at least three (3) minutes. Rinse thoroughly with water and dry well with a towel.  Perform this wash unless told not to do so by your physician.  Enclosed: 1 Ice pack (please put in freezer the night before surgery)   1 Hibiclens skin cleaner     Pre-op instructions  If you have any questions regarding the instructions, please do not hesitate to call our office.  [place]: [address], [place], [place] [address] -- [phone]  [place]: [address] [place], [place] [address] -- [phone]  [place]: [address], [address] , [place] [address] -- [phone]   Website: [email or web address] 

## 2020-01-11 NOTE — Progress Notes (Signed)
   HPI: 34 y.o. female presenting today for follow-up evaluation regarding bilateral Achilles tendinitis.  Patient states that the pain has returned and it is very symptomatic.  She is unable to work or perform her job duties without pain.  She continues to go to physical therapy and wear her custom molded orthotics with minimal relief.  She also continues to take meloxicam as prescribed.  Despite multiple conservative modalities she has not had any lasting relief for the past year.  No new complaints at this time  Past Medical History:  Diagnosis Date  . Anxiety   . Depression   . Hyperlipidemia   . Migraines   . Obesity   . PCOS (polycystic ovarian syndrome)      Physical Exam: General: The patient is alert and oriented x3 in no acute distress.  Dermatology: Skin is warm, dry and supple bilateral lower extremities. Negative for open lesions or macerations.  Vascular: Palpable pedal pulses bilaterally. No edema or erythema noted. Capillary refill within normal limits.  Neurological: Epicritic and protective threshold grossly intact bilaterally.   Musculoskeletal Exam: Range of motion within normal limits to all pedal and ankle joints bilateral. Muscle strength 5/5 in all groups bilateral.  There is actually minimal tenderness palpation along the Achilles tendon bilateral lower extremities.  There is some tight gastroc equinus and limited ankle joint dorsiflexion of the right lower extremity consistent with gastroc equinus.  This is likely contributing to the patient's chronic Achilles tendinitis right greater than the left  Assessment: 1.  Achilles tendinitis bilateral, insertional 2.  Gastroc equinus right greater than left   Plan of Care:  1. Patient evaluated.  2.  Continue physical therapy at [facility] on [address]  Also continue home stretching exercises 3.  Continue meloxicam as needed 4.  Continue wearing custom molded orthotics 5.   Injection of 0.5 cc Celestone Soluspan injected into the retrocalcaneal bursa, lateral aspect, of the right lower extremity  6.  Unfortunately the patient has failed approximately 1 year now of conservative treatment for the chronic Achilles tendinitis.  Today we discussed the conservative versus surgical management of the presenting pathology. The patient opts for surgical management. All possible complications and details of the procedure were explained. All patient questions were answered. No guarantees were expressed or implied. 3. Authorization for surgery was initiated today. Surgery will consist of gastroc lengthening right lower extremity.  I do believe this will help significantly to reduce the tension from the insertion of the Achilles tendon and potentially provide significant relief 4.  Order placed for knee scooter to use postoperatively  5.  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  Returns 02/19/2020.  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 Felecia Shelling, DPM    [address] [place], [place] [address]                Office [phone]  Fax [phone]

## 2020-01-14 ENCOUNTER — Telehealth: Payer: Self-pay

## 2020-01-14 NOTE — Telephone Encounter (Signed)
Received surgery paperwork from the [facility] office. Left message for Amyjo to call me back and schedule surgery.

## 2020-01-15 ENCOUNTER — Ambulatory Visit: Payer: No Typology Code available for payment source | Admitting: Physical Therapy

## 2020-01-21 ENCOUNTER — Encounter: Payer: No Typology Code available for payment source | Admitting: Physical Therapy

## 2020-01-23 ENCOUNTER — Encounter: Payer: No Typology Code available for payment source | Admitting: Physical Therapy

## 2020-01-28 ENCOUNTER — Encounter: Payer: No Typology Code available for payment source | Admitting: Physical Therapy

## 2020-01-30 ENCOUNTER — Telehealth: Payer: No Typology Code available for payment source | Admitting: Family

## 2020-01-30 ENCOUNTER — Other Ambulatory Visit: Payer: Self-pay | Admitting: Family

## 2020-01-30 DIAGNOSIS — J069 Acute upper respiratory infection, unspecified: Secondary | ICD-10-CM | POA: Diagnosis not present

## 2020-01-30 MED ORDER — BENZONATATE 100 MG PO CAPS: 100.0000 mg | ORAL_CAPSULE | Freq: Three times a day (TID) | ORAL | 0 refills | Status: DC | PRN

## 2020-01-30 MED ORDER — FLUTICASONE PROPIONATE 50 MCG/ACT NA SUSP: 2.0000 | Freq: Every day | NASAL | 6 refills | Status: DC

## 2020-01-30 NOTE — Progress Notes (Signed)

## 2020-02-04 ENCOUNTER — Encounter: Payer: No Typology Code available for payment source | Admitting: Physical Therapy

## 2020-02-11 ENCOUNTER — Encounter: Payer: No Typology Code available for payment source | Admitting: Physical Therapy

## 2020-02-22 ENCOUNTER — Other Ambulatory Visit: Payer: Self-pay | Admitting: Family Medicine

## 2020-02-22 DIAGNOSIS — E039 Hypothyroidism, unspecified: Secondary | ICD-10-CM

## 2020-03-05 ENCOUNTER — Telehealth: Payer: No Typology Code available for payment source | Admitting: Nurse Practitioner

## 2020-03-05 DIAGNOSIS — J069 Acute upper respiratory infection, unspecified: Secondary | ICD-10-CM | POA: Diagnosis not present

## 2020-03-05 NOTE — Progress Notes (Signed)

## 2020-03-31 ENCOUNTER — Telehealth: Payer: Self-pay | Admitting: Podiatry

## 2020-03-31 NOTE — Telephone Encounter (Addendum)
DOS: 04/24/2020  Procedure: Tendo-Achilles Lengthening Rt (73344)  [facility] Effective From: 04/19/2020 - 04/18/2021  Deductible: $0 Out of Pocket: $2,500 with $0 met and $2,500 remaining. CoInsurance: Copay: $500  Per Darrick Penna Prior Authorization is Required and Transferred me without giving me a call reference number.  Per Jaynie Collins clinicals need to be faxed to [phone] Attn.: [facility] Call Reference Number [phone]  MedWatch for [facility] Authorization# [id number] valid from 04/24/20 - 05/22/20.

## 2020-04-04 DIAGNOSIS — M79676 Pain in unspecified toe(s): Secondary | ICD-10-CM

## 2020-04-24 ENCOUNTER — Other Ambulatory Visit: Payer: Self-pay | Admitting: Podiatry

## 2020-04-24 DIAGNOSIS — M7661 Achilles tendinitis, right leg: Secondary | ICD-10-CM

## 2020-04-24 MED ORDER — IBUPROFEN 800 MG PO TABS: 800.0000 mg | ORAL_TABLET | Freq: Three times a day (TID) | ORAL | 1 refills | Status: DC

## 2020-04-24 MED ORDER — OXYCODONE-ACETAMINOPHEN 5-325 MG PO TABS: 1.0000 | ORAL_TABLET | ORAL | 0 refills | Status: DC | PRN

## 2020-04-24 NOTE — Progress Notes (Signed)
PRN postop 

## 2020-05-02 ENCOUNTER — Ambulatory Visit (INDEPENDENT_AMBULATORY_CARE_PROVIDER_SITE_OTHER): Payer: No Typology Code available for payment source | Admitting: Podiatry

## 2020-05-02 ENCOUNTER — Encounter: Payer: Self-pay | Admitting: Podiatry

## 2020-05-02 ENCOUNTER — Other Ambulatory Visit: Payer: Self-pay

## 2020-05-02 VITALS — Temp 98.1°F | Resp 16

## 2020-05-02 DIAGNOSIS — M7662 Achilles tendinitis, left leg: Secondary | ICD-10-CM

## 2020-05-02 DIAGNOSIS — Z9889 Other specified postprocedural states: Secondary | ICD-10-CM

## 2020-05-02 DIAGNOSIS — M7661 Achilles tendinitis, right leg: Secondary | ICD-10-CM

## 2020-05-02 NOTE — Progress Notes (Signed)
   Subjective:  Patient presents today status post gastroc aponeurosis lengthening right. DOS: 04/24/2020.  Patient states that she is feeling well.  She has been wearing the boot as instructed with the assistance of the knee scooter to get around.  Patient states that her heel was not down all the way in the boot and her foot was in a semiplantarflexed position for the past week.  She presents for further treatment and evaluation  Past Medical History:  Diagnosis Date  . Anxiety   . Depression   . Hyperlipidemia   . Migraines   . Obesity   . PCOS (polycystic ovarian syndrome)       Objective/Physical Exam Neurovascular status intact.  Skin incisions appear to be well coapted with staples intact. No sign of infectious process noted. No dehiscence. No active bleeding noted.  Minimal edema noted to the surgical extremity.  Assessment: 1. s/p gastroc aponeurosis lengthening right. DOS: 04/24/2020   Plan of Care:  1. Patient was evaluated. 2.  Dressings changed today. 3.  The cam boot was applied today with a heel down into the boot properly. 4.  Continue ambulating with a knee scooter.  Patient may weight-bear as tolerated 5.  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  Returns 02/19/2020.  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 Felecia Shelling, DPM    [address] [place], [place] [address]                Office [phone]  Fax [phone]

## 2020-05-09 ENCOUNTER — Ambulatory Visit (INDEPENDENT_AMBULATORY_CARE_PROVIDER_SITE_OTHER): Payer: No Typology Code available for payment source | Admitting: Podiatry

## 2020-05-09 ENCOUNTER — Encounter: Payer: Self-pay | Admitting: Podiatry

## 2020-05-09 ENCOUNTER — Other Ambulatory Visit: Payer: Self-pay

## 2020-05-09 DIAGNOSIS — Z9889 Other specified postprocedural states: Secondary | ICD-10-CM

## 2020-05-09 NOTE — Progress Notes (Signed)
   Subjective:  Patient presents today status post gastroc aponeurosis lengthening right. DOS: 04/24/2020.  Patient states that she is feeling well.  She has been wearing the boot as instructed with the assistance of the knee scooter to get around.  No new complaints at this time  Past Medical History:  Diagnosis Date  . Anxiety   . Depression   . Hyperlipidemia   . Migraines   . Obesity   . PCOS (polycystic ovarian syndrome)       Objective/Physical Exam Neurovascular status intact.  Skin incisions appear to be well coapted with staples intact. No sign of infectious process noted. No dehiscence. No active bleeding noted.  Minimal edema noted to the surgical extremity.  Assessment: 1. s/p gastroc aponeurosis lengthening right. DOS: 04/24/2020   Plan of Care:  1. Patient was evaluated.  Staples removed today 2.  Recommend Ace wrap daily 3.  Patient may begin weightbearing in the cam boot 4.  Return to clinic in 2 weeks.  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 Felecia Shelling, DPM    [address] [place], [place] [address]                Office [phone]  Fax [phone]

## 2020-05-23 ENCOUNTER — Other Ambulatory Visit: Payer: Self-pay

## 2020-05-23 ENCOUNTER — Encounter: Payer: Self-pay | Admitting: Podiatry

## 2020-05-23 ENCOUNTER — Ambulatory Visit (INDEPENDENT_AMBULATORY_CARE_PROVIDER_SITE_OTHER): Payer: No Typology Code available for payment source | Admitting: Podiatry

## 2020-05-23 DIAGNOSIS — Z9889 Other specified postprocedural states: Secondary | ICD-10-CM

## 2020-05-23 NOTE — Progress Notes (Signed)
   Subjective:  Patient presents today status post gastroc aponeurosis lengthening right. DOS: 04/24/2020.  Patient states that she is feeling well.  She has been weightbearing in the cam boot as instructed.  No new complaints at this time  Past Medical History:  Diagnosis Date  . Anxiety   . Depression   . Hyperlipidemia   . Migraines   . Obesity   . PCOS (polycystic ovarian syndrome)       Objective/Physical Exam Neurovascular status intact.  Skin incisions appear to be well coapted and healed. No sign of infectious process noted. No dehiscence. No active bleeding noted.  Negative for any significant edema noted to the surgical extremity.  Negative for any pain on palpation along the incision site.  There is good dorsiflexion of the ankle joint  Assessment: 1. s/p gastroc aponeurosis lengthening right. DOS: 04/24/2020   Plan of Care:  1. Patient was evaluated.   2.  Patient may transition out of the cam boot into good supportive sneakers 3.  Patient is going to resume physical therapy at [facility].  Patient already has an established relationship there 4.  Stressed the importance of daily stretching exercises to break up any scar tissue adhesions to the posterior leg and surgery site 5.  Return to clinic in 4 weeks.  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 Felecia Shelling, DPM    [address] [place], [place] [address]                Office [phone]  Fax [phone]

## 2020-05-27 ENCOUNTER — Other Ambulatory Visit: Payer: Self-pay | Admitting: Family Medicine

## 2020-05-27 DIAGNOSIS — E039 Hypothyroidism, unspecified: Secondary | ICD-10-CM

## 2020-06-08 ENCOUNTER — Emergency Department: Payer: No Typology Code available for payment source

## 2020-06-08 ENCOUNTER — Emergency Department
Admission: EM | Admit: 2020-06-08 | Discharge: 2020-06-08 | Disposition: A | Discharge status: Home / Self Care | Payer: No Typology Code available for payment source | Attending: Emergency Medicine | Admitting: Emergency Medicine

## 2020-06-08 ENCOUNTER — Other Ambulatory Visit: Payer: Self-pay

## 2020-06-08 ENCOUNTER — Other Ambulatory Visit: Payer: Self-pay | Admitting: Physician Assistant

## 2020-06-08 DIAGNOSIS — Z7984 Long term (current) use of oral hypoglycemic drugs: Secondary | ICD-10-CM | POA: Insufficient documentation

## 2020-06-08 DIAGNOSIS — Y9301 Activity, walking, marching and hiking: Secondary | ICD-10-CM | POA: Diagnosis not present

## 2020-06-08 DIAGNOSIS — Z79899 Other long term (current) drug therapy: Secondary | ICD-10-CM | POA: Insufficient documentation

## 2020-06-08 DIAGNOSIS — E039 Hypothyroidism, unspecified: Secondary | ICD-10-CM | POA: Insufficient documentation

## 2020-06-08 DIAGNOSIS — S86001A Unspecified injury of right Achilles tendon, initial encounter: Secondary | ICD-10-CM | POA: Diagnosis not present

## 2020-06-08 DIAGNOSIS — M79661 Pain in right lower leg: Secondary | ICD-10-CM | POA: Diagnosis present

## 2020-06-08 DIAGNOSIS — X509XXA Other and unspecified overexertion or strenuous movements or postures, initial encounter: Secondary | ICD-10-CM | POA: Insufficient documentation

## 2020-06-08 DIAGNOSIS — S86009A Unspecified injury of unspecified Achilles tendon, initial encounter: Secondary | ICD-10-CM

## 2020-06-08 DIAGNOSIS — Y92009 Unspecified place in unspecified non-institutional (private) residence as the place of occurrence of the external cause: Secondary | ICD-10-CM | POA: Insufficient documentation

## 2020-06-08 DIAGNOSIS — S86011A Strain of right Achilles tendon, initial encounter: Secondary | ICD-10-CM

## 2020-06-08 DIAGNOSIS — Z9889 Other specified postprocedural states: Secondary | ICD-10-CM | POA: Insufficient documentation

## 2020-06-08 IMAGING — US US EXTREM LOW*R* LIMITED
1 series · 14 of 20 positions shown · non-contrast
Comparison: None.

CLINICAL DATA: Achilles tendon injury.

EXAM:
ULTRASOUND right LOWER EXTREMITY LIMITED
TECHNIQUE: Ultrasound examination of the lower extremity soft tissues was
performed in the area of clinical concern.

[Series 1: us left lower extrem ltd soft tissue non vascular · 20 acquisitions, 14 frames shown]
[im 1/20]
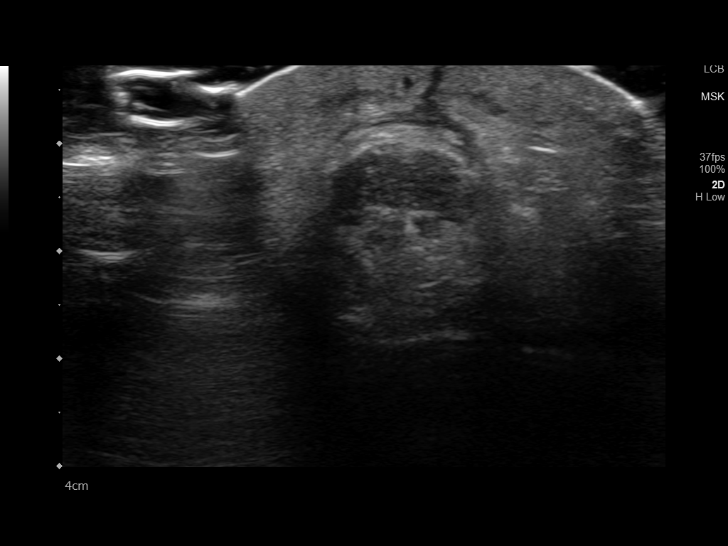
[im 3/20]
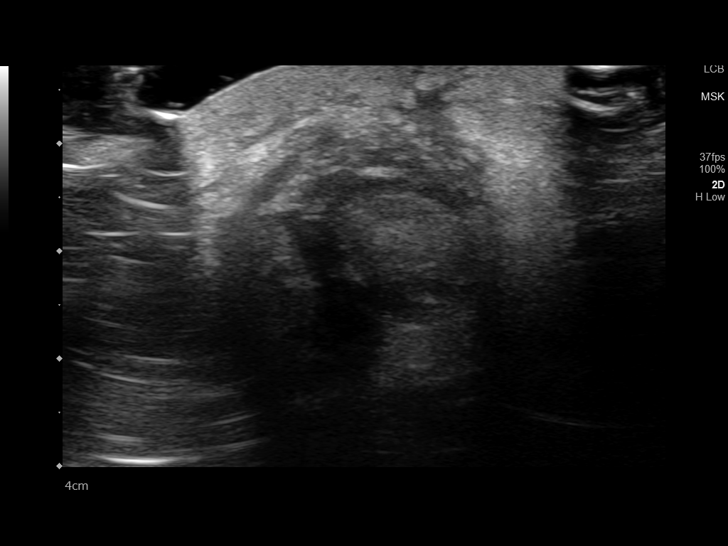
[im 4/20]
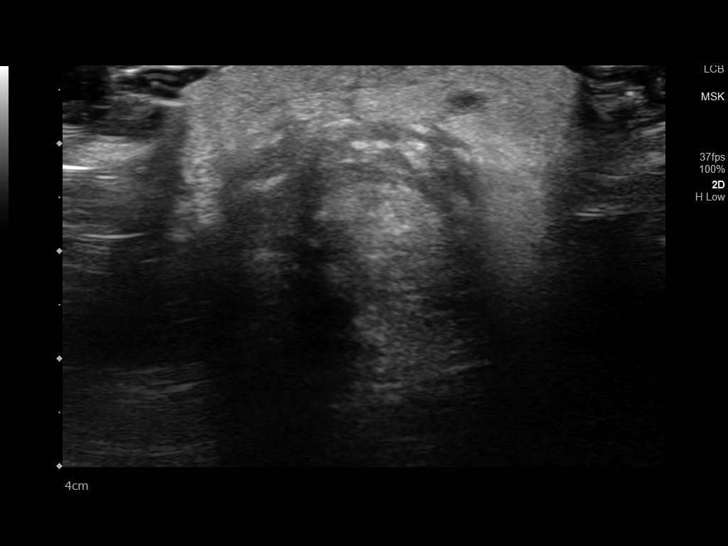
[im 6/20]
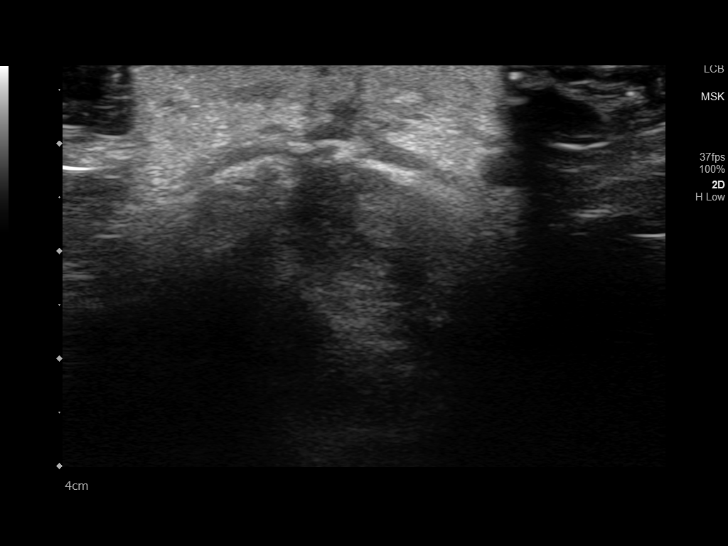
[im 7/20]
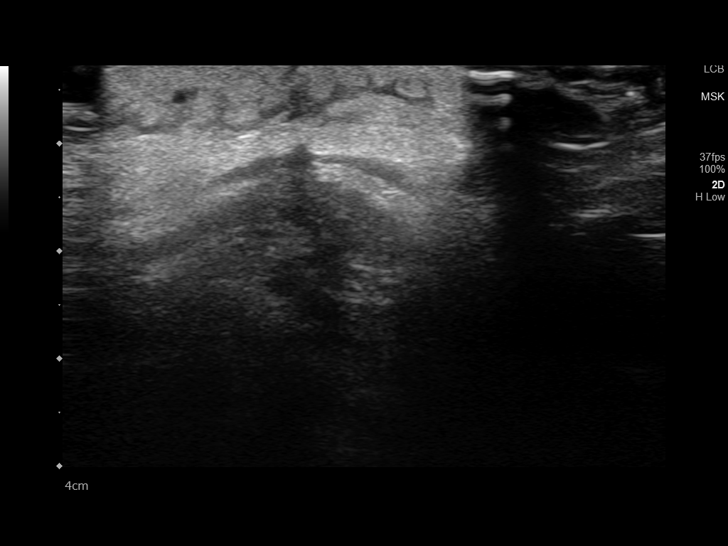
[im 8/20]
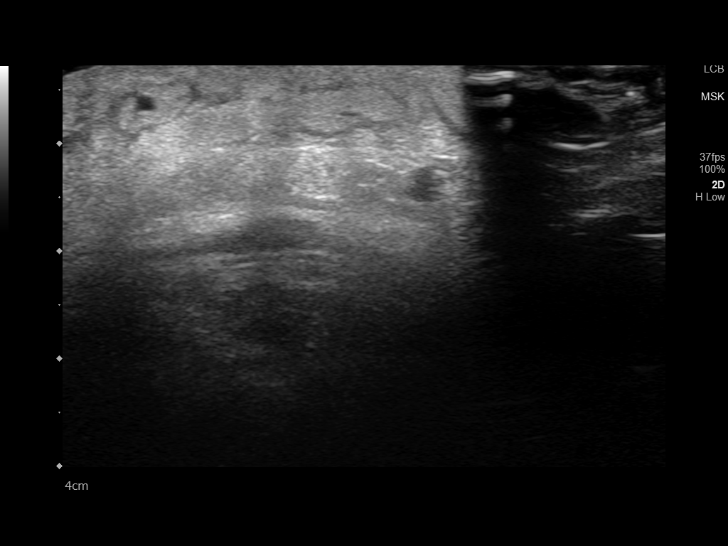
[im 10/20]
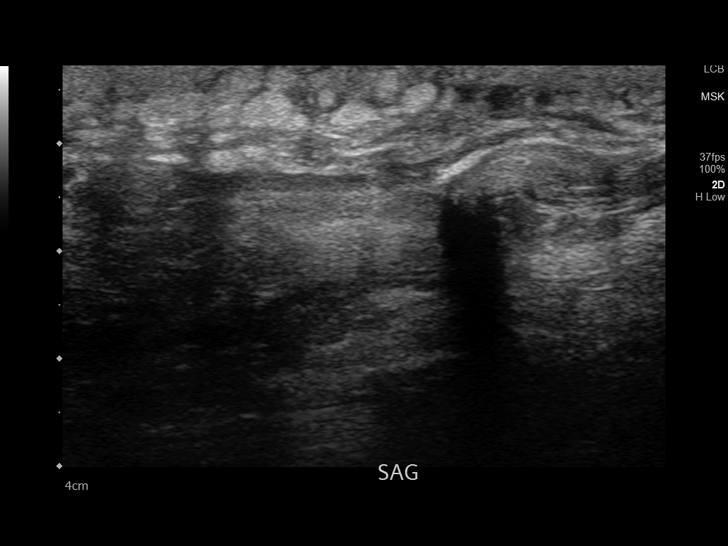
[im 11/20]
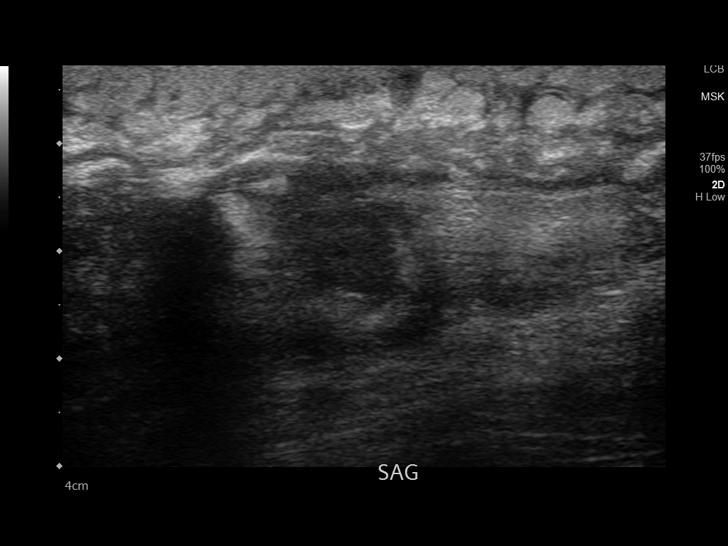
[im 13/20]
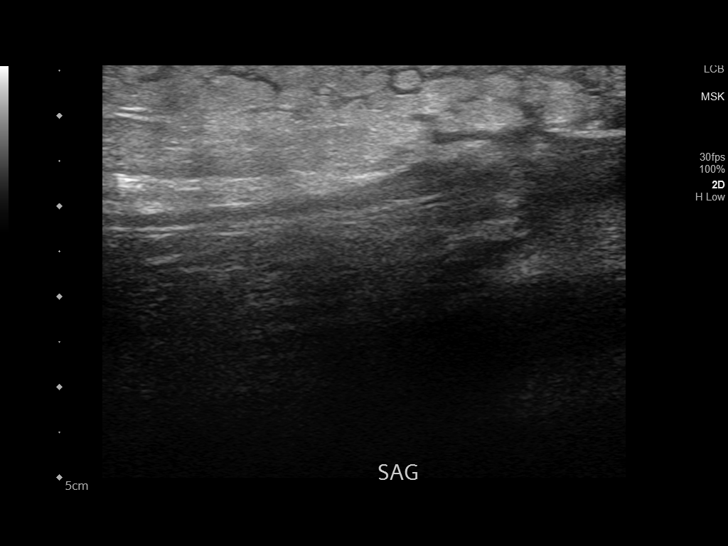
[im 14/20]
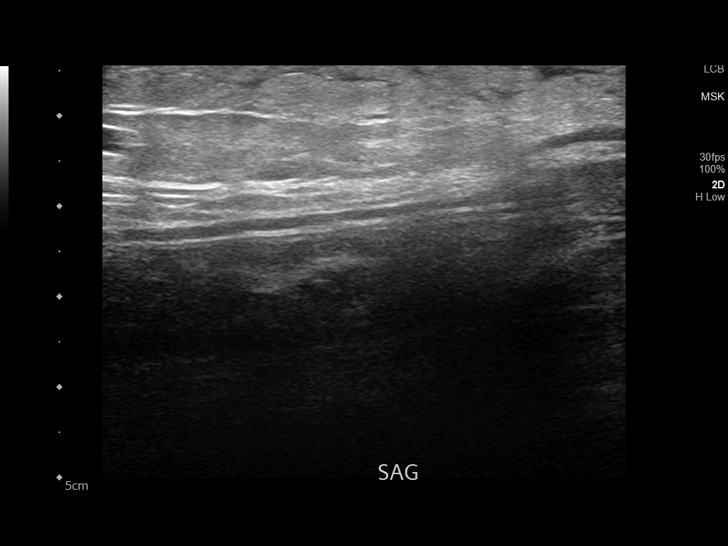
[im 16/20]
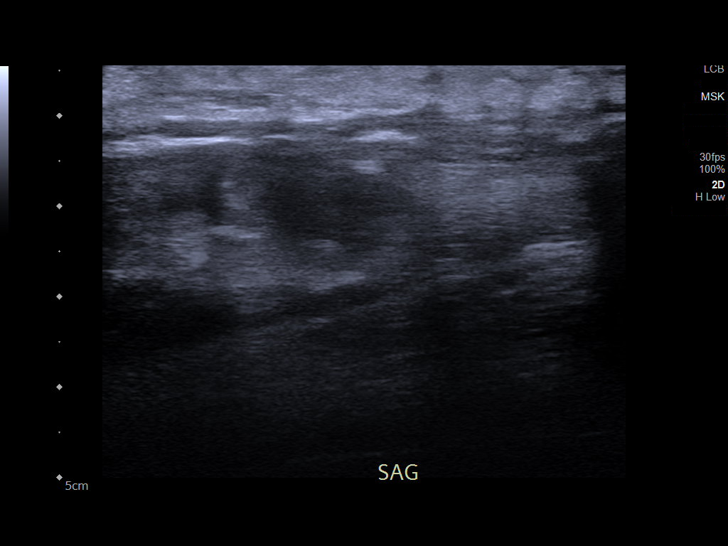
[im 17/20]
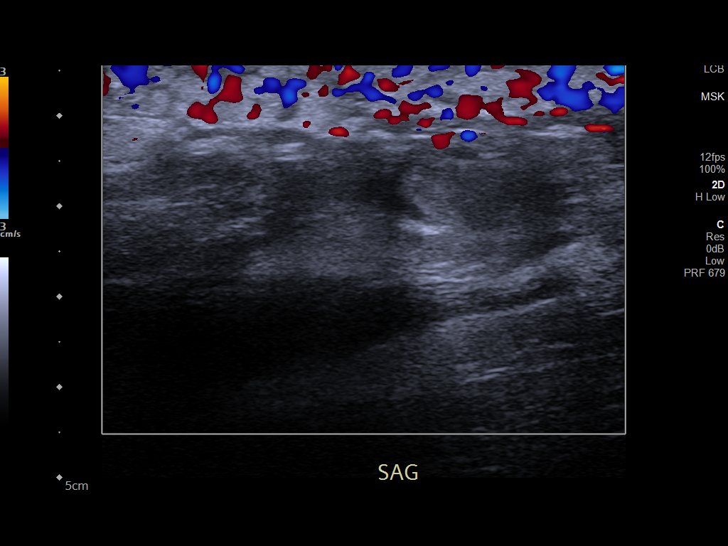
[im 18/20]
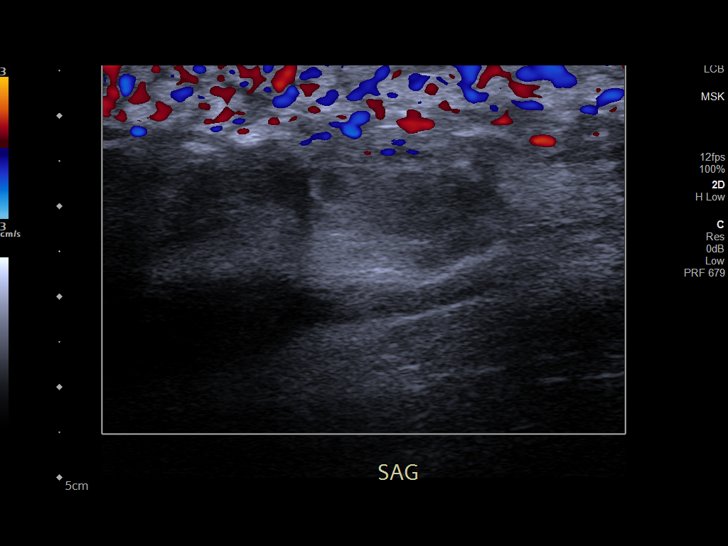
[im 20/20]
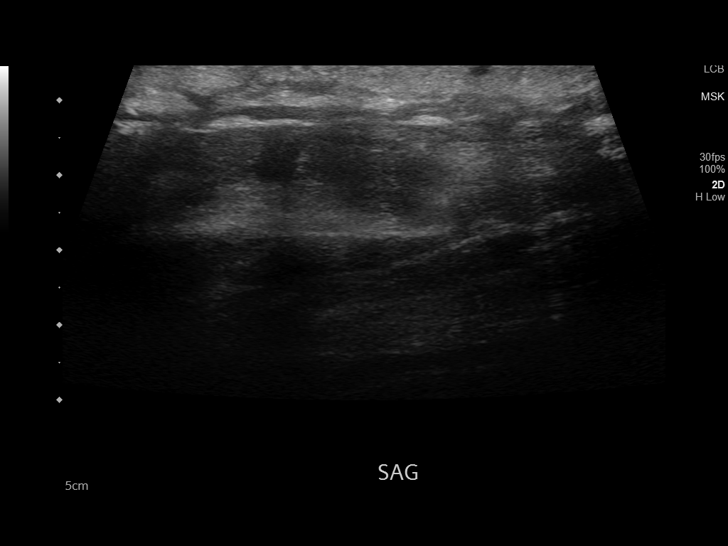

[14 of 20 positions shown; findings below may reference images not displayed]

FINDINGS: Evaluation of the right lower extremity at the level of the Achilles
tendon demonstrates extensive edema. The Achilles tendon is
difficult to visualized secondary to the extensive edema but the
portions that are visualized appear to be discontinuous, raising
concern for a complete tear of the Achilles tendon.
IMPRESSION: Extensive edema with findings concerning for an Achilles tendon
rupture. Follow-up with MRI is recommended.

## 2020-06-08 IMAGING — MR MR [PERSON_NAME] LOW W/O CM*R*
4 of 5 series · 12 of 40 positions shown · non-contrast
Comparison: Right tibia and fibula x-rays and right lower extremity
ultrasound from same day.

CLINICAL DATA: Right calf pain after slipping on a toy and hearing
and feeling a pop. Recent Achilles tendon lengthening procedure in
early [REDACTED].

EXAM:
MRI OF LOWER RIGHT EXTREMITY WITHOUT CONTRAST; MRI OF THE RIGHT
ANKLE WITHOUT CONTRAST
TECHNIQUE: Multiplanar, multisequence MR imaging of the right lower leg and
ankle was performed. No intravenous contrast was administered.

[Series 4: t1_[id number]_bilat_ · coronal · right · 4.0mm · 0.41mm/px · 3 of 33 slices shown]
[im 1/33]
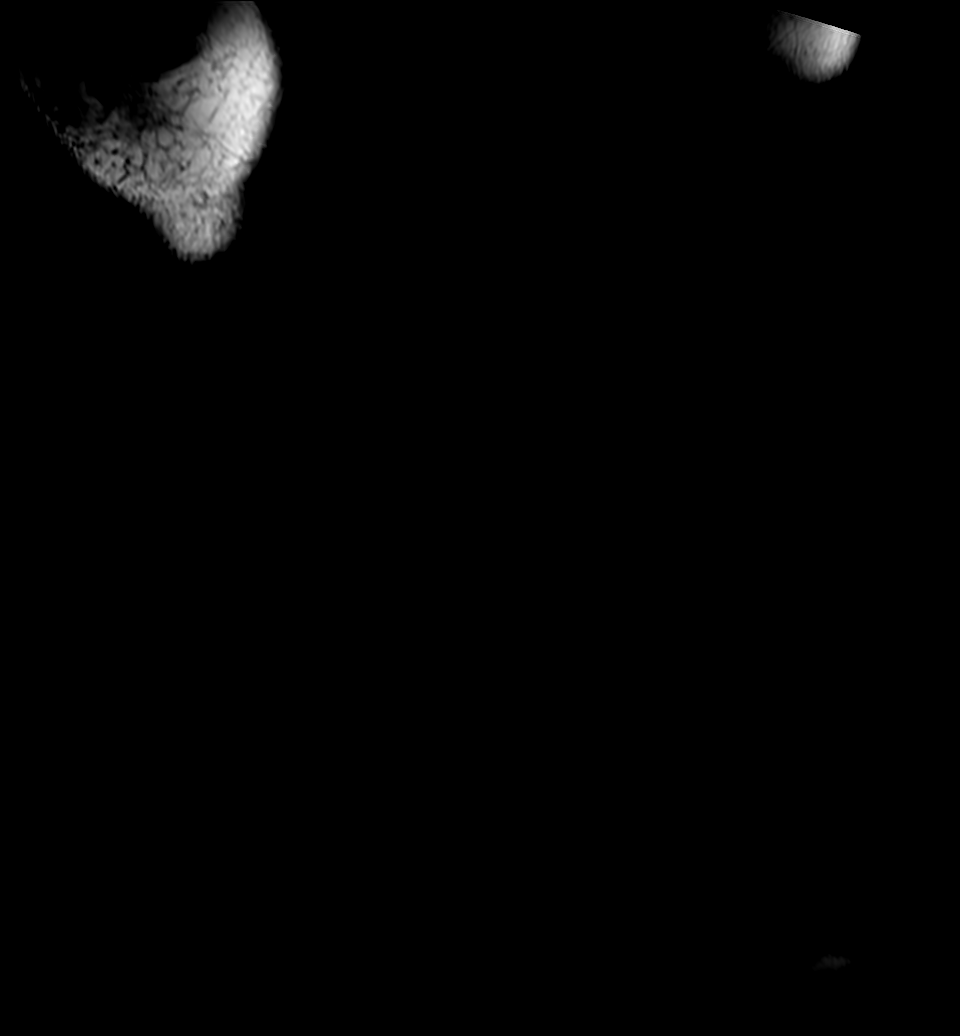
[im 17/33]
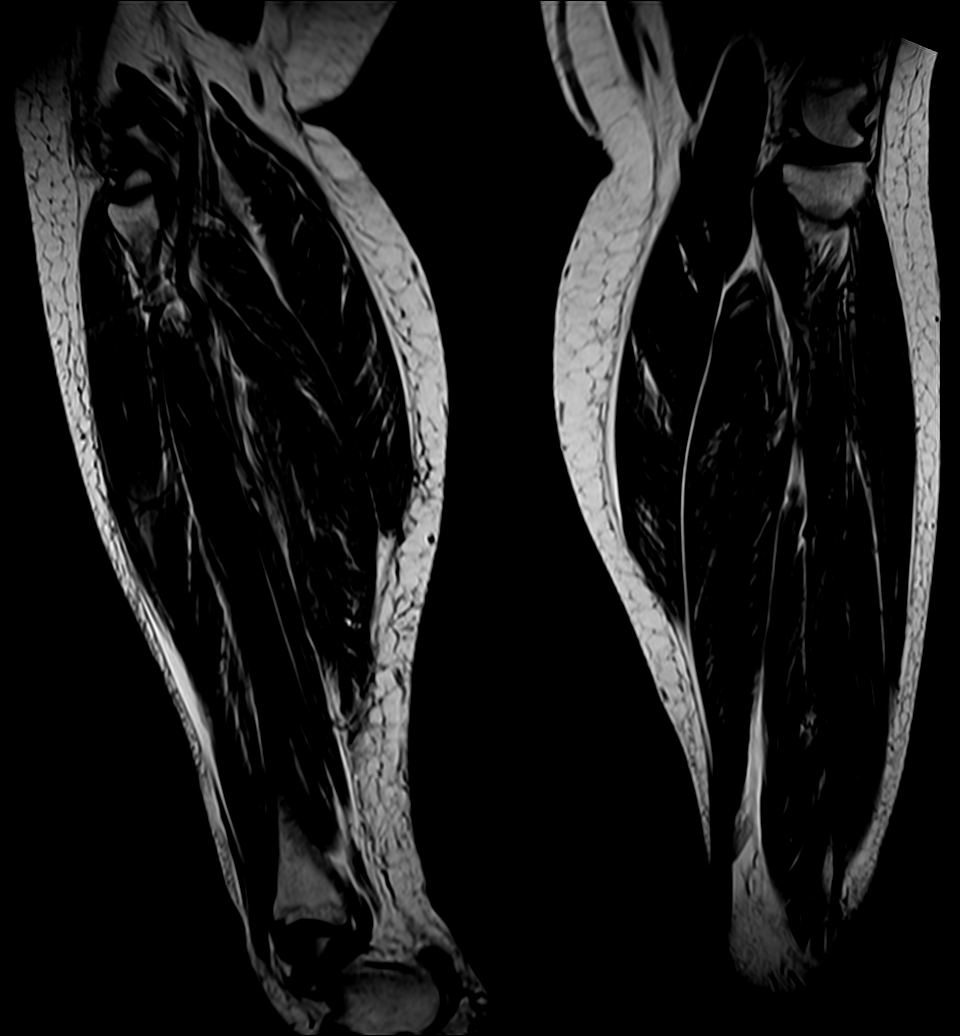
[im 33/33]
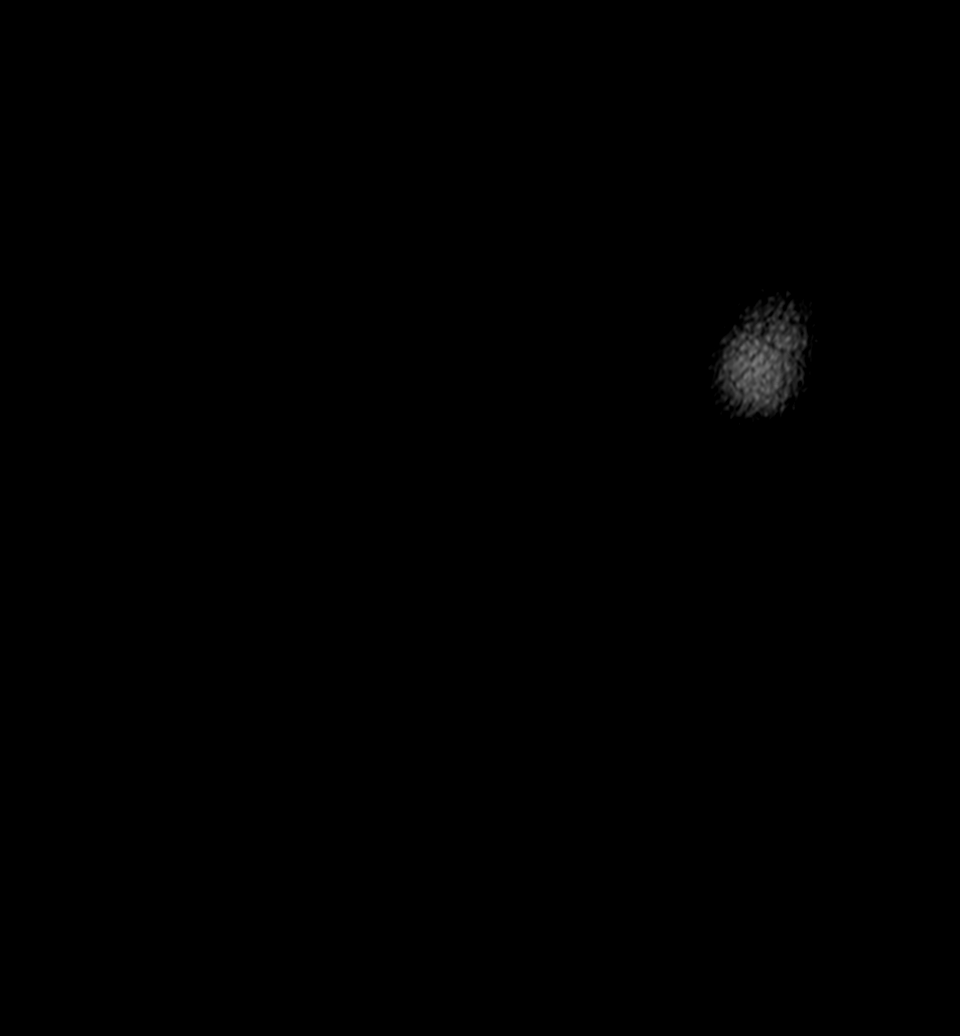

[Series 5: t2_[id number]_ · coronal · right · 4.0mm · 0.55mm/px · 3 of 34 slices shown]
[im 1/34]
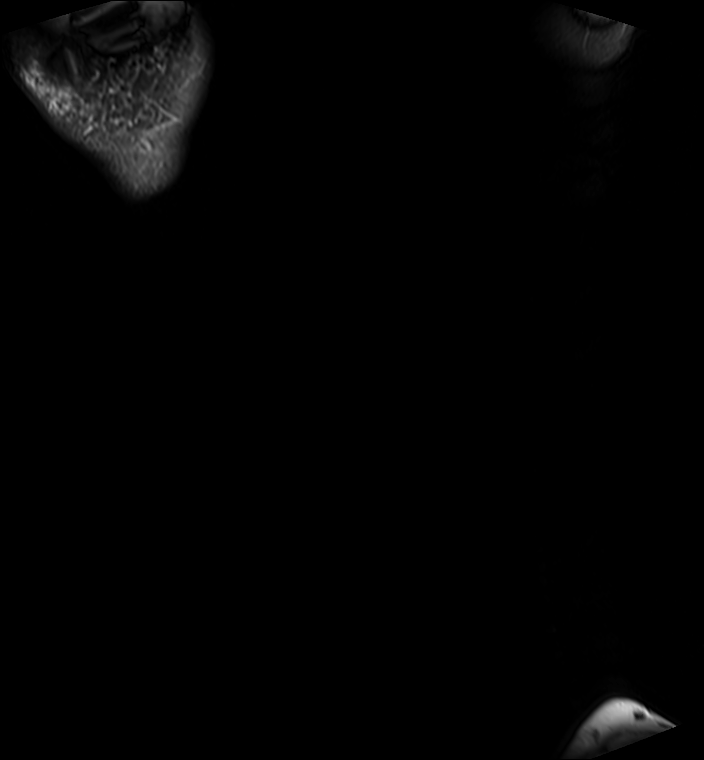
[im 17/34]
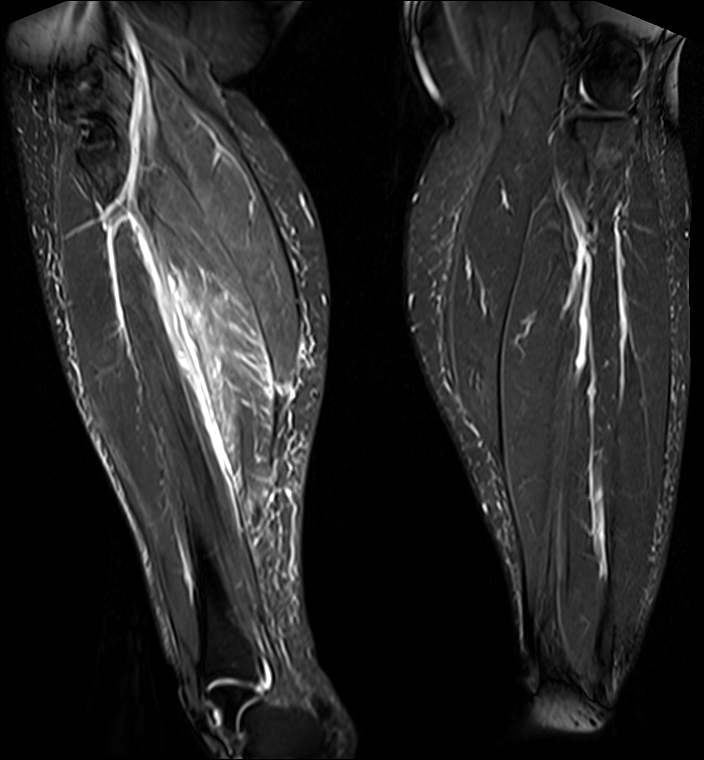
[im 34/34]
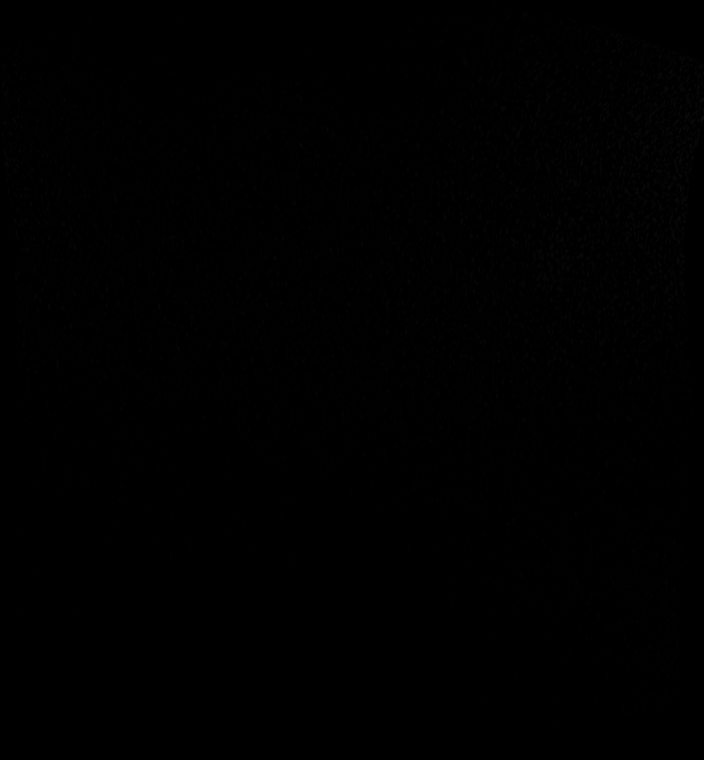

[Series 8: ax t1_comp_filt · axial · right · 4.0mm · 0.70mm/px · z∈[-267,+16]mm · 3 of 88 slices shown]
[im 15/88]
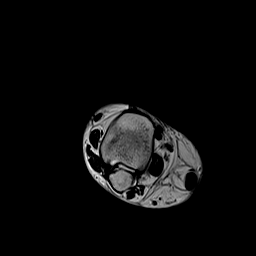
[im 44/88]
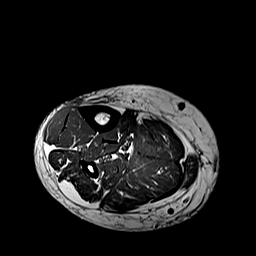
[im 73/88]
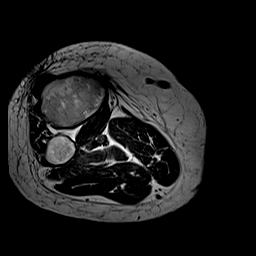

[Series 11: T2 · axial · right · 4.0mm · 0.70mm/px · z∈[-267,+16]mm · 3 of 88 slices shown]
[im 15/88]
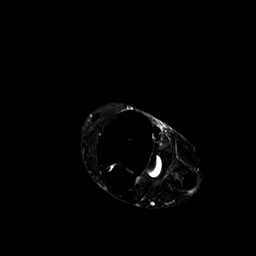
[im 44/88]
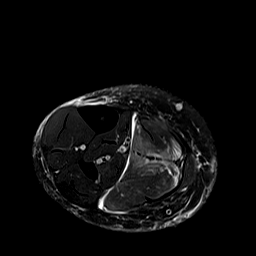
[im 73/88]
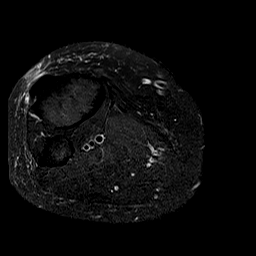

[12 of 40 positions shown; findings below may reference images not displayed]

FINDINGS: TENDONS

Peroneal: Peroneal longus tendon intact. Peroneal brevis intact.

Posteromedial: Posterior tibial tendon intact. Flexor digitorum
longus tendon intact. Flexor hallucis longus tendon intact. Small
amount of fluid within all three flexor tendon sheaths.

Anterior: Tibialis anterior tendon intact. Extensor hallucis longus
tendon intact Extensor digitorum longus tendon intact.

Achilles: Essentially complete tear of the proximal Achilles tendon
10.1 cm above the insertion with up to 3.6 cm retraction. The mid to
distal tendon is mild-to-moderately thickened with prominent
increased intermediate signal at the insertion.

Plantar Fascia: Intact.

LIGAMENTS

Lateral: Anterior talofibular ligament intact. Calcaneofibular
ligament intact. Posterior talofibular ligament intact. Anterior and
posterior tibiofibular ligaments intact.

Medial: Deltoid ligament intact. Spring ligament intact.

CARTILAGE

Ankle Joint: No joint effusion. Normal ankle mortise. No chondral
defect.

Subtalar Joints/Sinus Tarsi: Normal subtalar joints. No subtalar
joint effusion. Normal sinus tarsi.

Bones: No marrow signal abnormality.  No fracture or dislocation.

Near full-thickness fissuring over the lateral patellar facet of the
knee.

Soft Tissue: Prominent edema in the distal soleus muscle and
surrounding fascia. Mild circumferential soft tissue swelling of the
distal lower leg. No soft tissue mass or fluid collection.
IMPRESSION: 1. Essentially complete tear of the proximal Achilles tendon 10.1 cm
above the insertion with up to 3.6 cm retraction. Mild to moderate
tendinosis of the mid and distal tendon.
2. Mild flexor tenosynovitis.

## 2020-06-08 IMAGING — MR MR ANKLE*R* W/O CM
6 series · 40 of 40 positions shown · non-contrast
Comparison: Right tibia and fibula x-rays and right lower extremity
ultrasound from same day.

CLINICAL DATA: Right calf pain after slipping on a toy and hearing
and feeling a pop. Recent Achilles tendon lengthening procedure in
early [REDACTED].

EXAM:
MRI OF LOWER RIGHT EXTREMITY WITHOUT CONTRAST; MRI OF THE RIGHT
ANKLE WITHOUT CONTRAST
TECHNIQUE: Multiplanar, multisequence MR imaging of the right lower leg and
ankle was performed. No intravenous contrast was administered.

[Series 3: T1 · axial · right · 3.0mm · 0.58mm/px · z∈[-50,+159]mm · 9 of 54 slices shown (1 of 2)]
[im 1/54]
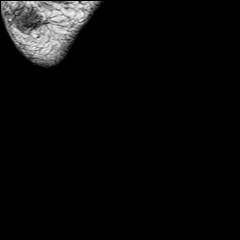
[im 7/54]
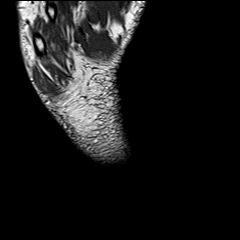
[im 14/54]
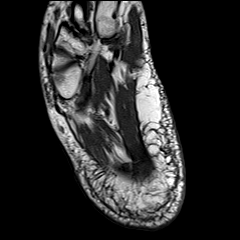
[im 20/54]
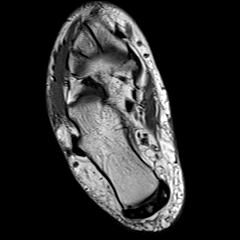
[im 27/54]
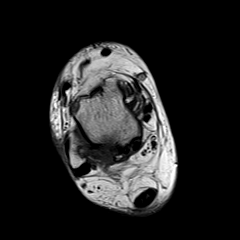
[im 34/54]
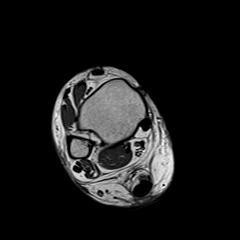
[im 40/54]
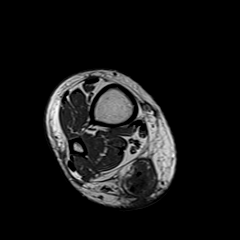
[im 47/54]
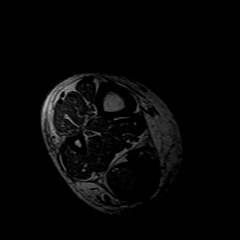
[im 54/54]
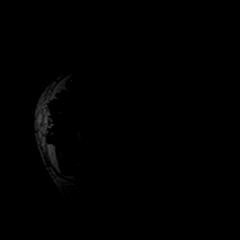

[Series 4: PD fat-sat · axial · right · 3.0mm · 0.50mm/px · z∈[-52,+156]mm · 9 of 54 slices shown]
[im 1/54]
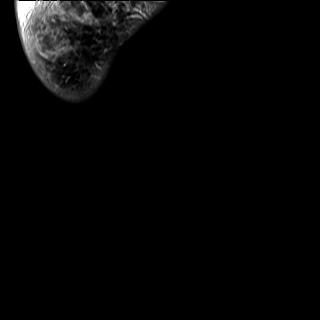
[im 7/54]
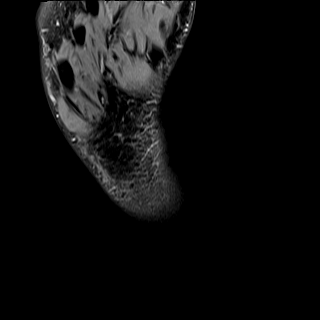
[im 14/54]
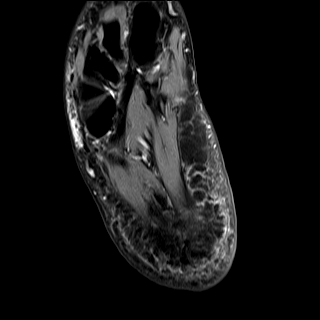
[im 20/54]
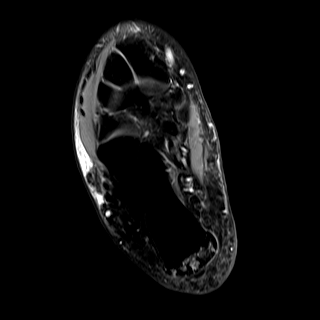
[im 27/54]
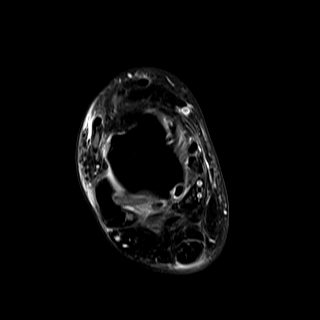
[im 34/54]
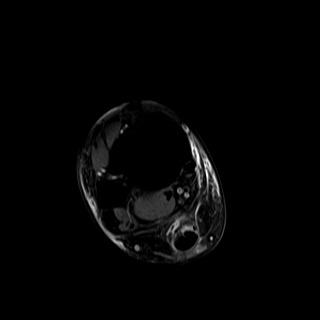
[im 40/54]
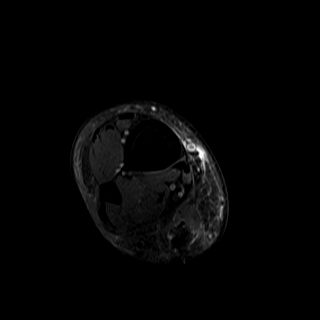
[im 47/54]
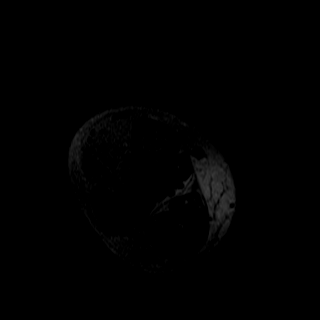
[im 54/54]
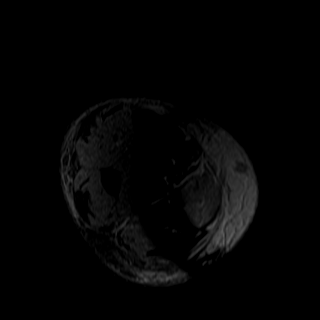

[Series 5: T2 fat-sat · axial · right · 3.0mm · 0.50mm/px · z∈[-52,+156]mm · 9 of 54 slices shown]
[im 1/54]
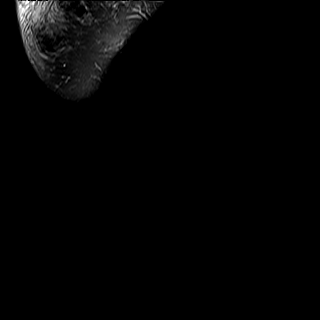
[im 7/54]
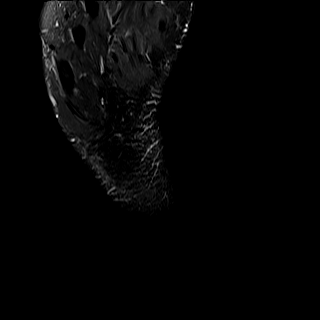
[im 14/54]
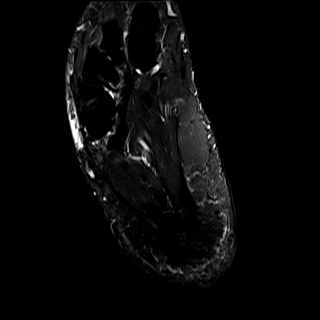
[im 20/54]
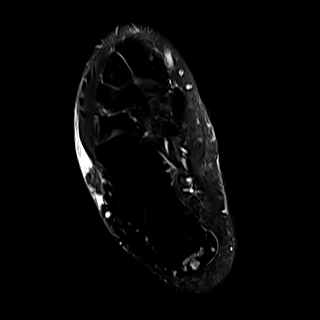
[im 27/54]
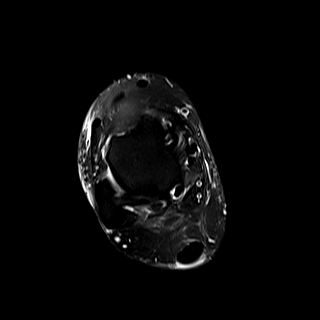
[im 34/54]
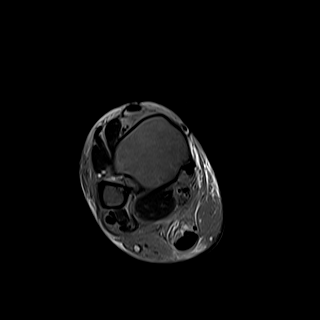
[im 40/54]
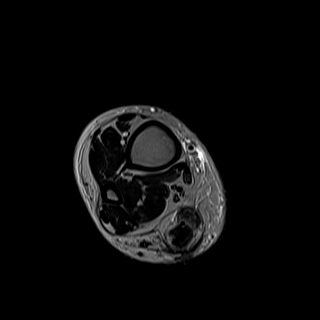
[im 47/54]
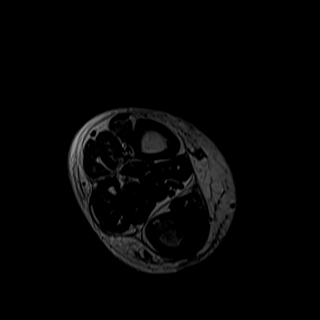
[im 54/54]
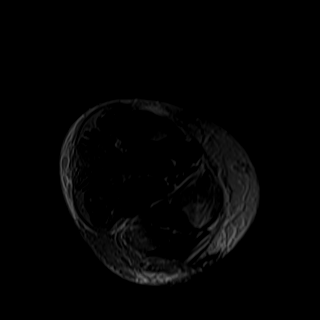

[Series 7: T2 · coronal · right · 3.0mm · 0.62mm/px · 7 of 40 slices shown]
[im 1/40]
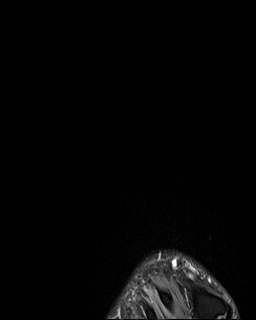
[im 7/40]
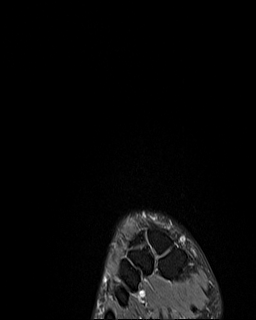
[im 14/40]
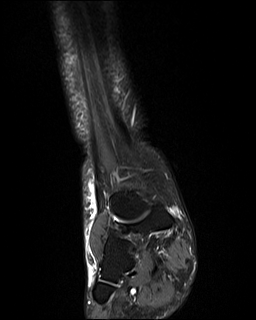
[im 20/40]
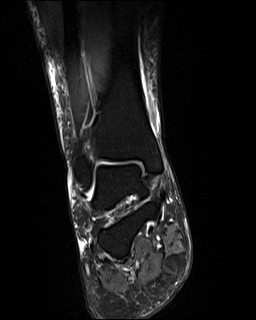
[im 27/40]
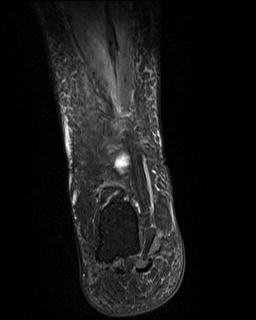
[im 33/40]
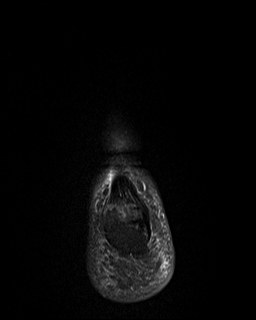
[im 40/40]
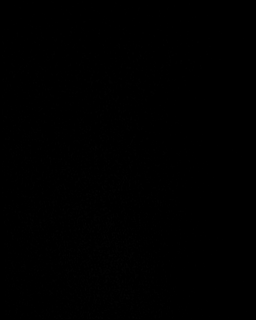

[Series 8: T1 · sagittal · right · 4.0mm · 0.70mm/px · 3 of 20 slices shown (2 of 2)]
[im 1/20]
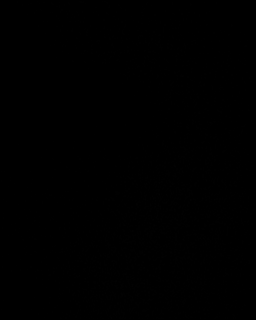
[im 10/20]
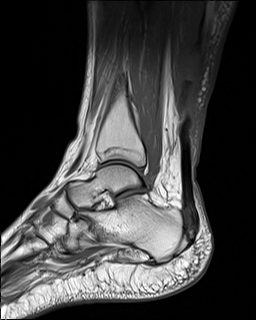
[im 20/20]
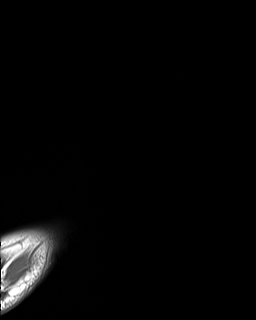

[Series 9: STIR · sagittal · right · 4.0mm · 0.35mm/px · 3 of 21 slices shown]
[im 1/21]
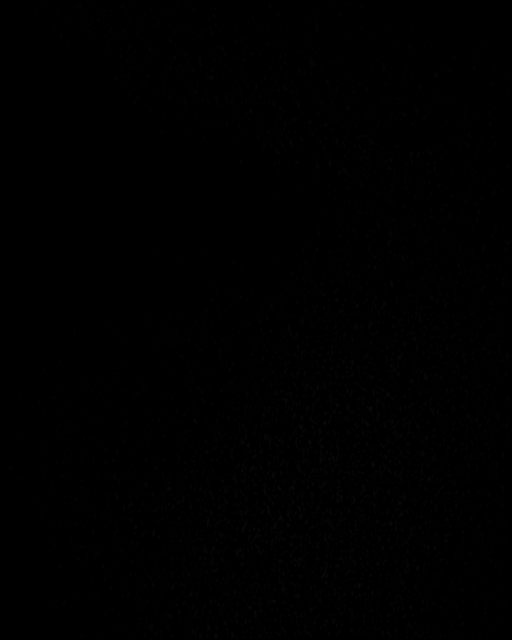
[im 11/21]
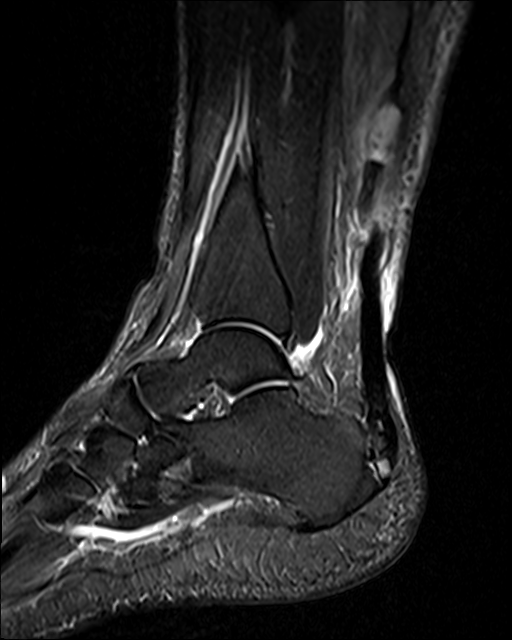
[im 21/21]
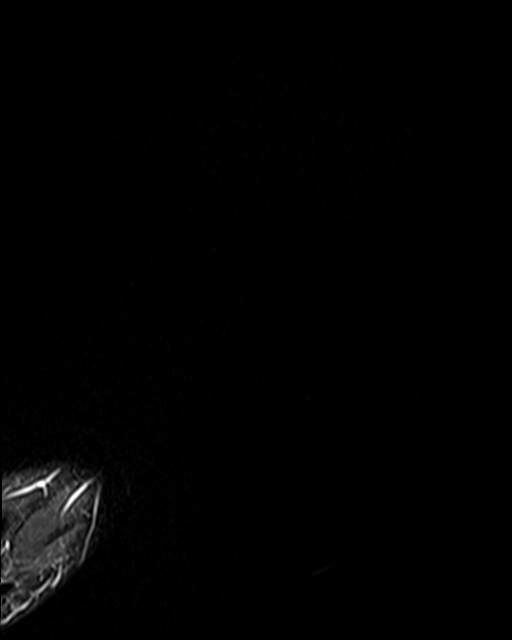

[40 of 40 positions shown; findings below may reference images not displayed]

FINDINGS: TENDONS

Peroneal: Peroneal longus tendon intact. Peroneal brevis intact.

Posteromedial: Posterior tibial tendon intact. Flexor digitorum
longus tendon intact. Flexor hallucis longus tendon intact. Small
amount of fluid within all three flexor tendon sheaths.

Anterior: Tibialis anterior tendon intact. Extensor hallucis longus
tendon intact Extensor digitorum longus tendon intact.

Achilles: Essentially complete tear of the proximal Achilles tendon
10.1 cm above the insertion with up to 3.6 cm retraction. The mid to
distal tendon is mild-to-moderately thickened with prominent
increased intermediate signal at the insertion.

Plantar Fascia: Intact.

LIGAMENTS

Lateral: Anterior talofibular ligament intact. Calcaneofibular
ligament intact. Posterior talofibular ligament intact. Anterior and
posterior tibiofibular ligaments intact.

Medial: Deltoid ligament intact. Spring ligament intact.

CARTILAGE

Ankle Joint: No joint effusion. Normal ankle mortise. No chondral
defect.

Subtalar Joints/Sinus Tarsi: Normal subtalar joints. No subtalar
joint effusion. Normal sinus tarsi.

Bones: No marrow signal abnormality.  No fracture or dislocation.

Near full-thickness fissuring over the lateral patellar facet of the
knee.

Soft Tissue: Prominent edema in the distal soleus muscle and
surrounding fascia. Mild circumferential soft tissue swelling of the
distal lower leg. No soft tissue mass or fluid collection.
IMPRESSION: 1. Essentially complete tear of the proximal Achilles tendon 10.1 cm
above the insertion with up to 3.6 cm retraction. Mild to moderate
tendinosis of the mid and distal tendon.
2. Mild flexor tenosynovitis.

## 2020-06-08 IMAGING — DX DG TIBIA/FIBULA 2V*R*
2 series · 2 of 2 positions shown · non-contrast
Comparison: Plain films of the right ankle [DATE].

CLINICAL DATA: Right lower leg pain after a fall today. Initial
encounter.

EXAM:
RIGHT TIBIA AND FIBULA - 2 VIEW

[tibia ap]
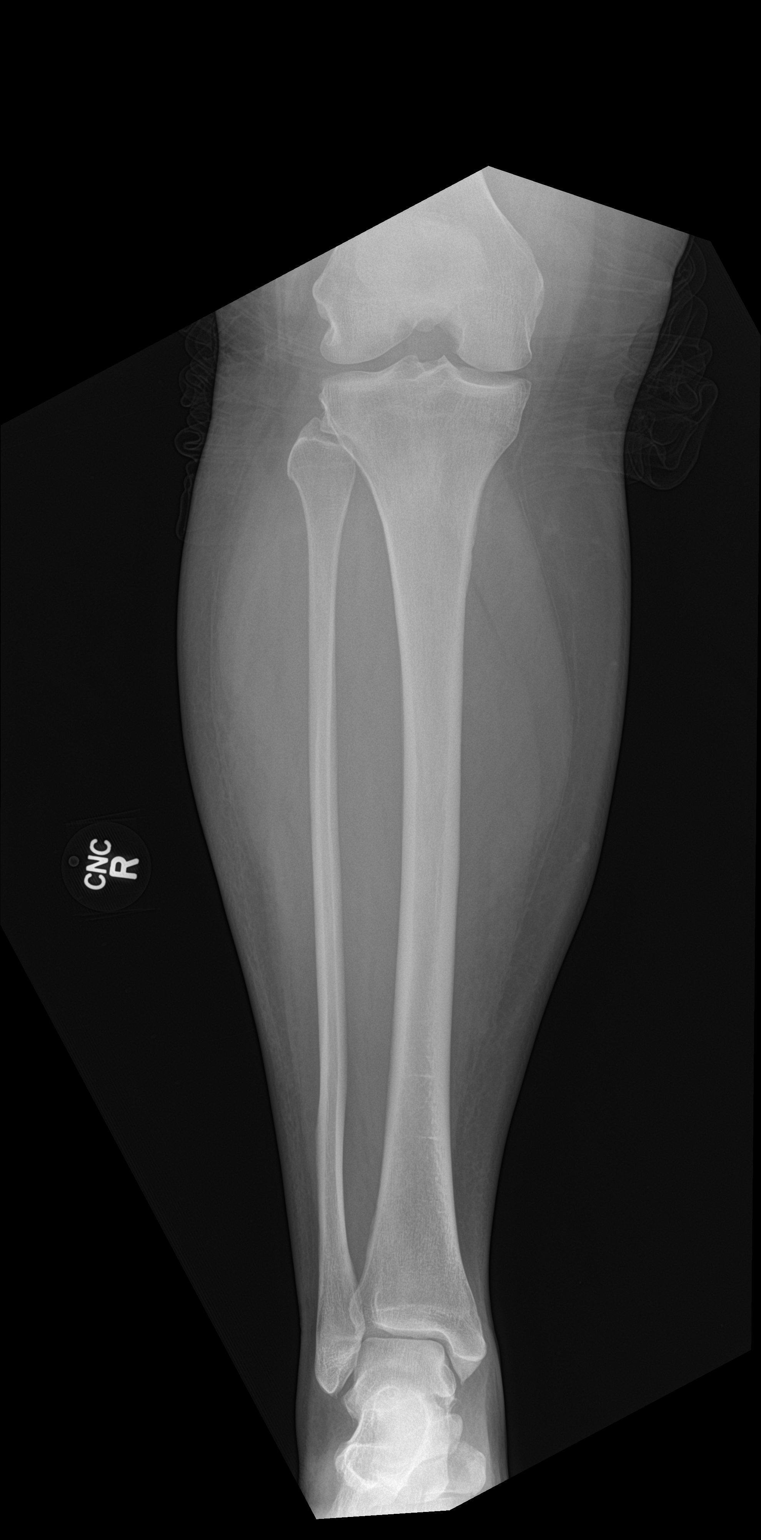

[tibia lat]
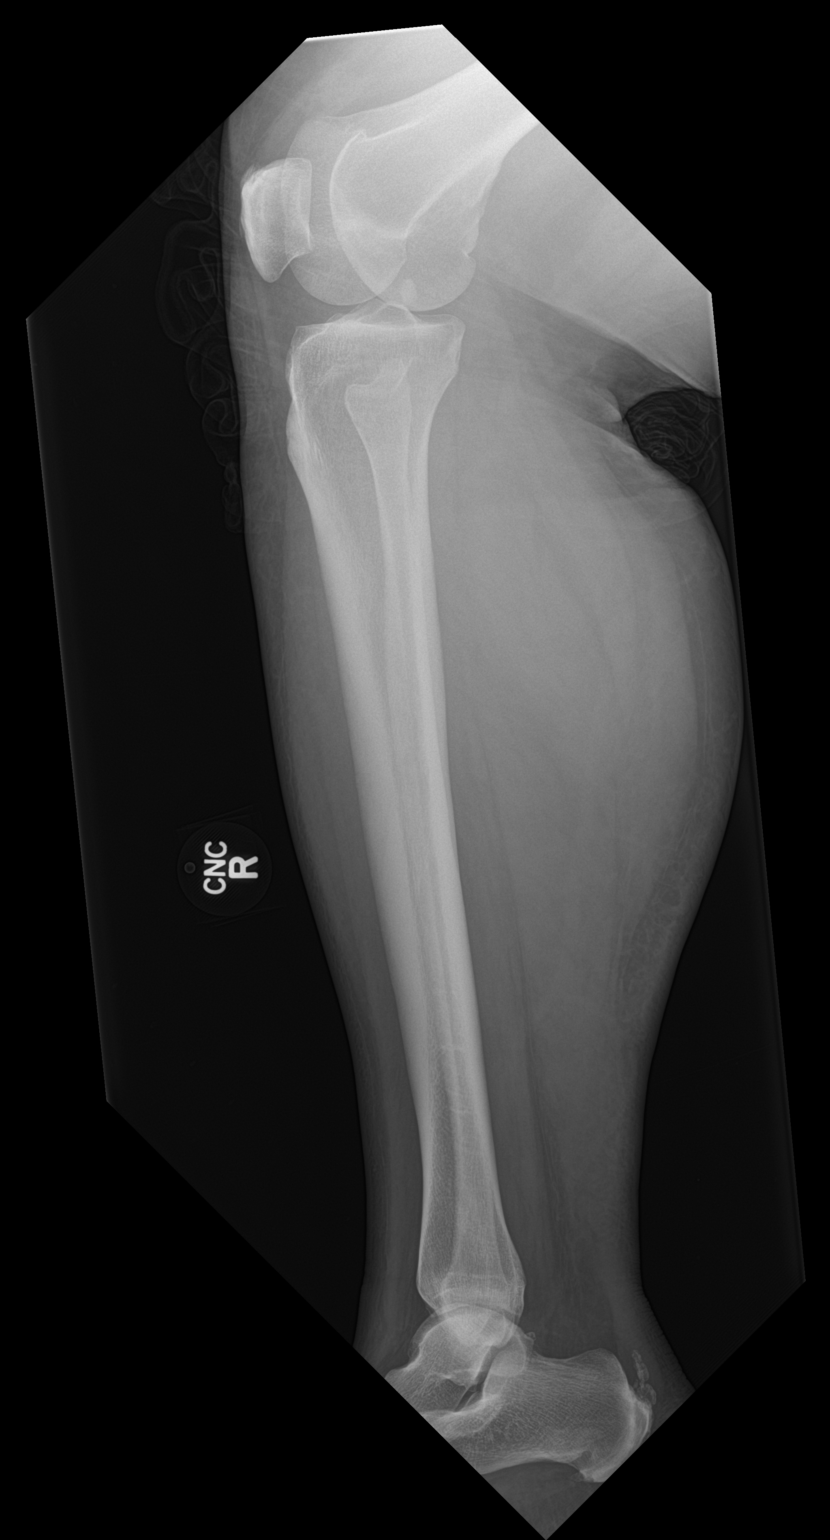

[2 of 2 positions shown; findings below may reference images not displayed]

FINDINGS: There is no evidence of fracture or other focal bone lesions.
Calcifications in the distal Achilles tendon noted. Soft tissues are
otherwise unremarkable.
IMPRESSION: No acute abnormality.

Calcifications in the distal Achilles tendon consistent with chronic
tendinosis have markedly progressed since the prior exam.

## 2020-06-08 MED ORDER — CYCLOBENZAPRINE HCL 10 MG PO TABS
5.0000 mg | ORAL_TABLET | Freq: Once | ORAL | Status: DC
  Filled 2020-06-08: qty 1

## 2020-06-08 MED ORDER — CYCLOBENZAPRINE HCL 10 MG PO TABS
10.0000 mg | ORAL_TABLET | Freq: Once | ORAL | Status: AC
  Administered 2020-06-08: 10 mg via ORAL

## 2020-06-08 MED ORDER — HYDROCODONE-ACETAMINOPHEN 5-325 MG PO TABS
1.0000 | ORAL_TABLET | Freq: Once | ORAL | Status: AC
  Administered 2020-06-08: 1 via ORAL
  Filled 2020-06-08: qty 1

## 2020-06-08 MED ORDER — METHOCARBAMOL 500 MG PO TABS: 500.0000 mg | ORAL_TABLET | Freq: Four times a day (QID) | ORAL | 0 refills | Status: DC

## 2020-06-08 NOTE — ED Notes (Signed)
ED Provider at bedside. 

## 2020-06-08 NOTE — ED Provider Notes (Signed)
[facility] Emergency Department Provider Note  ____________________________________________   Event Date/Time   First MD Initiated Contact with Patient 06/08/20 1627     (approximate)  I have reviewed the triage vital signs and the nursing notes.   HISTORY  Chief Complaint Ankle Pain  HPI Cynthia George is a 35 y.o. female who reports to the emergency department for evaluation of right calf pain.  Patient is postop from an Achilles lengthening procedure with Dr. Logan Bores with podiatry at [facility].  She had been released from her cam boot, and was walking at home when she slipped on her child's toy and felt and heard a pop with immediate pain in the right calf.  She has not tried any alleviating measures prior to arrival to the ER.  Pain is a 10/10.  Made worse with any movement of the foot or ankle.  Improved with lying still.         Past Medical History:  Diagnosis Date  . Anxiety   . Depression   . Hyperlipidemia   . Migraines   . Obesity   . PCOS (polycystic ovarian syndrome)     Patient Active Problem List   Diagnosis Date Noted  . Infertility associated with anovulation 06/23/2019  . Major depression, single episode, in complete remission (HCC) 06/22/2019  . Postpartum hypertension 11/18/2016  . Gestational hypertension without significant proteinuria 11/01/2016  . Morbid obesity (HCC) 07/27/2016  . Hypothyroid 06/30/2016  . Elevated LFTs 05/27/2016  . HLD (hyperlipidemia)   . Migraines   . PCOS (polycystic ovarian syndrome)     Past Surgical History:  Procedure Laterality Date  . APPENDECTOMY  2013  . HUMERUS FRACTURE SURGERY Left 2002,2003   rod and screws placed and removed  . TONSILLECTOMY AND ADENOIDECTOMY  2004  . WISDOM TOOTH EXTRACTION  2011   All    Prior to Admission medications   Medication Sig Start Date End Date Taking? Authorizing Provider  benzonatate (TESSALON PERLES) 100 MG capsule Take 1 capsule  (100 mg total) by mouth 3 (three) times daily as needed. 01/30/20   Junie Spencer, FNP  fluticasone (FLONASE) 50 MCG/ACT nasal spray Place 2 sprays into both nostrils daily. 01/30/20   Jannifer Rodney A, FNP  ibuprofen (ADVIL) 800 MG tablet Take 1 tablet (800 mg total) by mouth 3 (three) times daily. 04/24/20   Felecia Shelling, DPM  letrozole [facility]) 2.5 MG tablet Take by mouth. 12/04/19   [provider]  levothyroxine (SYNTHROID) 100 MCG tablet TAKE 1 TABLET BY MOUTH DAILY BEFORE BREAKFAST. 05/27/20   Johnson, Megan P, DO  medroxyPROGESTERone (PROVERA) 10 MG tablet Take 1 tablet (10 mg total) by mouth daily. Use for ten days 01/30/19   Vena Austria, MD  meloxicam (MOBIC) 15 MG tablet Take 1 tablet (15 mg total) by mouth daily. 08/28/19   Felecia Shelling, DPM  metFORMIN (GLUCOPHAGE-XR) 500 MG 24 hr tablet Take 2 tablets (1,000 mg total) by mouth daily with breakfast. 06/22/19   Johnson, Megan P, DO  OVIDREL 250 MCG/0.5ML injection Inject into the skin. 12/04/19   [provider]  oxyCODONE-acetaminophen (PERCOCET) 5-325 MG tablet Take 1 tablet by mouth every 4 (four) hours as needed for severe pain. 04/24/20   Felecia Shelling, DPM    Allergies Flagyl [metronidazole], Keflex [cephalexin], and Septra [sulfamethoxazole-trimethoprim]  Family History  Problem Relation Age of Onset  . Heart disease Mother   . Hypertension Mother   . Hyperlipidemia  Mother   . Heart attack Mother   . Hyperlipidemia Father   . Hypertension Father   . Diabetes Father   . Asthma Father   . Heart attack Father 61  . Hyperlipidemia Sister   . Asthma Sister   . Polycystic ovary syndrome Sister   . Mental illness Sister        Depression  . Mental illness Brother        Depression  . Diabetes Maternal Grandmother   . Hyperlipidemia Maternal Grandmother   . Hypertension Maternal Grandmother   . Diabetes Maternal Grandfather   . Hypertension Maternal Grandfather   . Heart attack Maternal Grandfather    . Diabetes Paternal Grandmother   . Heart attack Paternal Grandfather     Social History Social History   Tobacco Use  . Smoking status: Never Smoker  . Smokeless tobacco: Never Used  Vaping Use  . Vaping Use: Never used  Substance Use Topics  . Alcohol use: No  . Drug use: No    Review of Systems Constitutional: No fever/chills Eyes: No visual changes. ENT: No sore throat. Cardiovascular: Denies chest pain. Respiratory: Denies shortness of breath. Gastrointestinal: No abdominal pain.  No nausea, no vomiting.  No diarrhea.  No constipation. Genitourinary: Negative for dysuria. Musculoskeletal: + Right calf pain, negative for back pain. Skin: Negative for rash. Neurological: Negative for headaches, focal weakness or numbness.  ____________________________________________   PHYSICAL EXAM:  VITAL SIGNS: ED Triage Vitals  Enc Vitals Group     BP 06/08/20 1650 137/81     Pulse Rate 06/08/20 1650 85     Resp 06/08/20 1650 16     Temp 06/08/20 1650 97.7 F (36.5 C)     Temp Source 06/08/20 1650 Oral     SpO2 06/08/20 1650 99 %     Weight 06/08/20 1634 249 lb 1.9 oz (113 kg)     Height 06/08/20 1634 5\' 3"  (1.6 m)     Head Circumference --      Peak Flow --      Pain Score 06/08/20 1650 10     Pain Loc --      Pain Edu? --      Excl. in GC? --    Constitutional: Alert and oriented. Well appearing and in no acute distress. Eyes: Conjunctivae are normal. PERRL. EOMI. Head: Atraumatic. Neck: No stridor.   Cardiovascular: Normal rate, regular rhythm. Grossly normal heart sounds.  Good peripheral circulation. Respiratory: Normal respiratory effort.  No retractions. Lungs CTAB. Musculoskeletal: There is a healed surgical incision noted to the central right posterior lower leg.  There is significant tenderness at the musculotendinous junction of the right Achilles/gastrocnemius, just superior to the incision site.  Patient has no movement of the foot or ankle with  Thompson's test.  Dorsal pedal pulse 2+, capillary refill less than 3 seconds all digits.  All compartments are soft. Neurologic:  Normal speech and language. No gross focal neurologic deficits are appreciated.  Gait not assessed secondary to right lower extremity injury Skin:  Skin is warm, dry and intact. No rash noted. Psychiatric: Mood and affect are normal. Speech and behavior are normal.  ____________________________________________  RADIOLOGY I, 06/10/20, personally viewed and evaluated these images (plain radiographs) as part of my medical decision making, as well as reviewing the written report by the radiologist.  ED provider interpretation: X-ray of the right tib-fib does not reveal any acute fracture, distal Achilles appears intact at its bony insertion  Official radiology report(s): DG Tibia/Fibula Right  Result Date: 06/08/2020 CLINICAL DATA:  Right lower leg pain after a fall today. Initial encounter. EXAM: RIGHT TIBIA AND FIBULA - 2 VIEW COMPARISON:  Plain films of the right ankle 02/15/2019. FINDINGS: There is no evidence of fracture or other focal bone lesions. Calcifications in the distal Achilles tendon noted. Soft tissues are otherwise unremarkable. IMPRESSION: No acute abnormality. Calcifications in the distal Achilles tendon consistent with chronic tendinosis have markedly progressed since the prior exam. Electronically Signed   By: Drusilla Kanner M.D.   On: 06/08/2020 16:54   [place] RT LOWER EXTREM LTD SOFT TISSUE NON VASCULAR  Result Date: 06/08/2020 CLINICAL DATA:  Achilles tendon injury. EXAM: ULTRASOUND right LOWER EXTREMITY LIMITED TECHNIQUE: Ultrasound examination of the lower extremity soft tissues was performed in the area of clinical concern. COMPARISON:  None. FINDINGS: Evaluation of the right lower extremity at the level of the Achilles tendon demonstrates extensive edema. The Achilles tendon is difficult to visualized secondary to the extensive edema but  the portions that are visualized appear to be discontinuous, raising concern for a complete tear of the Achilles tendon. IMPRESSION: Extensive edema with findings concerning for an Achilles tendon rupture. Follow-up with MRI is recommended. Electronically Signed   By: Katherine Mantle M.D.   On: 06/08/2020 18:00   ____________________________________________   INITIAL IMPRESSION / ASSESSMENT AND PLAN / ED COURSE  As part of my medical decision making, I reviewed the following data within the electronic MEDICAL RECORD NUMBER Nursing notes reviewed and incorporated, Old chart reviewed, Radiograph reviewed and Notes from prior ED visits        Patient is a 35 year old female who presents to the emergency department for evaluation of acute right calf pain in the setting of being recently postop from Achilles procedure.  See HPI for further details.  In triage, the patient has normal vital signs.  Physical exam, she does have tenderness as well as positive Thompson's test, but is neurovascularly intact.  On x-ray, there is no evidence of acute distal Achilles injury, ultrasound was obtained to further evaluate and is concerning for possible full-thickness Achilles rupture.  Dr. Logan Bores of podiatry was on-call for [facility], and given that he was her surgeon, he was contacted for his recommendations.  We will proceed with MRI and then have the patient nonweightbearing, back any cam walker boot.  He will call the patient in the morning to discuss MRI results, and she will also call and schedule a sooner follow-up visit in his office.  Patient is amenable with this plan, will be given anti-inflammatory and Tylenol for pain.  Patient will be signed out to Alain Marion, PA-C pending MRI and discharge.      ____________________________________________   FINAL CLINICAL IMPRESSION(S) / ED DIAGNOSES  Final diagnoses:  Achilles tendon injury     ED Discharge Orders    None      *Please note:  NEFTALI THUROW was evaluated in Emergency Department on 06/08/2020 for the symptoms described in the history of present illness. She was evaluated in the context of the global COVID-19 pandemic, which necessitated consideration that the patient might be at risk for infection with the SARS-CoV-2 virus that causes COVID-19. Institutional protocols and algorithms that pertain to the evaluation of patients at risk for COVID-19 are in a state of rapid change based on information released by regulatory bodies including the CDC and federal and state organizations. These policies and algorithms were followed during the  patient's care in the ED.  Some ED evaluations and interventions may be delayed as a result of limited staffing during and the pandemic.*   Note:  This document was prepared using Dragon voice recognition software and may include unintentional dictation errors.   Lucy ChrisRodgers, Jewell Ryans J, PA 06/08/20 Prentice [id number]    Sharman CheekStafford, Phillip, MD 06/08/20 [phone]

## 2020-06-08 NOTE — ED Notes (Signed)
Patient reports stepping on her child's toy this morning, rolling her right ankle. Patient reports "I heard a pop." The patient reports achilles tendon extension a month ago. Patient incision site WNL. Minimal swelling noted to right ankle. No obvious trauma or injury noted.

## 2020-06-08 NOTE — ED Triage Notes (Signed)
Pt presents via POV c/o right ankle injury. Pt reports prior surgery to same ankle.

## 2020-06-08 NOTE — Discharge Instructions (Addendum)
You should take the prescription medicines as prescribed.  Follow-up with Dr. Excell Seltzer, for further management.  Ambulate with the Cam walker in place, using your knee walkder for nonweightbearing gait.  Rest with the foot elevated and ice and/or heat as necessary.  Return to the ED if necessary.

## 2020-06-08 NOTE — ED Provider Notes (Signed)
-----------------------------------------   8:24 PM on 06/08/2020 -----------------------------------------  Blood pressure 137/81, pulse 85, temperature 97.7 F (36.5 C), temperature source Oral, resp. rate 16, height 5\' 3"  (1.6 m), weight 113 kg, SpO2 99 %.  Assuming care from Dr. , PA-C/NP-C.  In short, Cynthia George is a 35 y.o. female with a chief complaint of Ankle Pain .  Refer to the original H&P for additional details.  The current plan of care is to wait MRI procedure, the patient is cleared to discharge to follow-up with her podiatrist as discussed.  Patient is to wear her cam walker and ambulate with and NWB gait.  ____________________________________________   RADIOLOGY  MRI Right Ankle / Tibia Fibula  ____________________________________________  PROCEDURES  Apply (patient's) CAM boot  Procedures ____________________________________________  INITIAL IMPRESSION / ASSESSMENT AND PLAN / ED COURSE  Patient ED evaluation management of acute right calf pain but full week status post Achilles lengthening procedure by Dr. 32, D.P.M., [facility].  Patient will follow up with Dr. Sanjuana Letters as discussed.  She is discharged in her cam walker, with nonweightbearing gait.  Prescription for vaccine is provided for benefit.  Return precautions have been discussed.  She should avoid anti-inflammatories until she is evaluated by the podiatrist.  Cynthia George was evaluated in Emergency Department on 06/08/2020 for the symptoms described in the history of present illness. She was evaluated in the context of the global COVID-19 pandemic, which necessitated consideration that the patient might be at risk for infection with the SARS-CoV-2 virus that causes COVID-19. Institutional protocols and algorithms that pertain to the evaluation of patients at risk for COVID-19 are in a state of rapid change based on information released by regulatory bodies including the CDC and  federal and state organizations. These policies and algorithms were followed during the patient's care in the ED. ____________________________________________  FINAL CLINICAL IMPRESSION(S) / ED DIAGNOSES  Final diagnoses:  Achilles tendon injury  Achilles tendon rupture, right, initial encounter      06/10/2020, PA-C 06/08/20 2028    2029, MD 06/09/20 1513

## 2020-06-08 NOTE — ED Notes (Signed)
Patient denies pain and is resting comfortably.  

## 2020-06-08 NOTE — ED Notes (Signed)
Ultrasound at bedside

## 2020-06-10 ENCOUNTER — Other Ambulatory Visit: Payer: Self-pay

## 2020-06-10 ENCOUNTER — Ambulatory Visit (INDEPENDENT_AMBULATORY_CARE_PROVIDER_SITE_OTHER): Payer: No Typology Code available for payment source | Admitting: Podiatry

## 2020-06-10 DIAGNOSIS — Z9889 Other specified postprocedural states: Secondary | ICD-10-CM

## 2020-06-10 DIAGNOSIS — M7662 Achilles tendinitis, left leg: Secondary | ICD-10-CM

## 2020-06-10 NOTE — Progress Notes (Signed)
   Subjective:  Patient presents today status post gastroc aponeurosis lengthening right. DOS: 04/24/2020.  Since last visit the patient sustained a trip and fall injury and went to the emergency department on 06/08/2020.  MRI was performed.  She is concerned that she reinjured her surgical site.  She presents today for follow-up treatment and evaluation  Past Medical History:  Diagnosis Date  . Anxiety   . Depression   . Hyperlipidemia   . Migraines   . Obesity   . PCOS (polycystic ovarian syndrome)       Objective/Physical Exam Neurovascular status intact.  Skin incisions appear to be well coapted and healed. No sign of infectious process noted. No dehiscence. No active bleeding noted.  There is a moderate edema noted to the surgical extremity.  MRI impression 06/08/2020 1. Essentially complete tear of the proximal Achilles tendon 10.1 cm above the insertion with up to 3.6 cm retraction. Mild to moderate tendinosis of the mid and distal tendon. 2. Mild flexor tenosynovitis.  Assessment: 1. s/p gastroc aponeurosis lengthening right. DOS: 04/24/2020 2.  Reinjury right lower extremity.  DOI: 06/08/2020   Plan of Care:  1. Patient was evaluated.   2.  Prior to the reinjury the patient was doing very well and walking in sneakers.  I explained to the patient that she needs to resume nonweightbearing in the cam boot x2 weeks.  At that time we may begin to weight-bear in the cam boot.  Patient will need an additional amount of time off of work 3.  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 Felecia Shelling, DPM    [address] [place], [place] [address]                Office [phone]  Fax [phone]

## 2020-06-13 ENCOUNTER — Telehealth: Payer: Self-pay

## 2020-06-13 NOTE — Telephone Encounter (Signed)
Pt would like to speak with someone about her FMLA paperwork. [employer] told pt that Dr. Edyth Gunnels office has the paperwork and need to extend the date to July 17, 2020.

## 2020-06-18 DIAGNOSIS — M79676 Pain in unspecified toe(s): Secondary | ICD-10-CM

## 2020-06-18 NOTE — Telephone Encounter (Signed)
I faxed the updated FMLA paperwork to [employer].    I called and left a message for Monasia informing her that I sent the updated FMLA paperwork to [employer].

## 2020-06-20 ENCOUNTER — Encounter: Payer: No Typology Code available for payment source | Admitting: Podiatry

## 2020-06-26 ENCOUNTER — Encounter: Payer: No Typology Code available for payment source | Admitting: Family Medicine

## 2020-06-27 ENCOUNTER — Encounter: Payer: Self-pay | Admitting: Podiatry

## 2020-06-27 ENCOUNTER — Ambulatory Visit (INDEPENDENT_AMBULATORY_CARE_PROVIDER_SITE_OTHER): Payer: No Typology Code available for payment source | Admitting: Podiatry

## 2020-06-27 ENCOUNTER — Other Ambulatory Visit: Payer: Self-pay

## 2020-06-27 DIAGNOSIS — Z9889 Other specified postprocedural states: Secondary | ICD-10-CM

## 2020-06-27 DIAGNOSIS — M7661 Achilles tendinitis, right leg: Secondary | ICD-10-CM

## 2020-06-27 NOTE — Progress Notes (Signed)
   Subjective:  Patient presents today status post gastroc aponeurosis lengthening right. DOS: 04/24/2020.  Patient states that she is doing very well.  She has no pain.  She has been nonweightbearing in the cam boot using the knee scooter.  No new complaints at this time  Past Medical History:  Diagnosis Date  . Anxiety   . Depression   . Hyperlipidemia   . Migraines   . Obesity   . PCOS (polycystic ovarian syndrome)       Objective/Physical Exam Neurovascular status intact.  Skin incisions appear to be well coapted and healed. No sign of infectious process noted. No dehiscence. No active bleeding noted.  There is a moderate edema noted to the surgical extremity.  MRI impression 06/08/2020 1. Essentially complete tear of the proximal Achilles tendon 10.1 cm above the insertion with up to 3.6 cm retraction. Mild to moderate tendinosis of the mid and distal tendon. 2. Mild flexor tenosynovitis.  Assessment: 1. s/p gastroc aponeurosis lengthening right. DOS: 04/24/2020 2.  Reinjury right lower extremity.  DOI: 06/08/2020   Plan of Care:  1. Patient was evaluated.   2.  Patient may now begin weightbearing in the cam boot x2 weeks 3.  After 2 weeks the patient may begin to transition out of the cam boot into good supportive sneakers 4.  Continue passive range of motion exercises 5.  Return to clinic in 3 weeks.  The patient is set to return to work 07/21/2020.  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 Felecia Shelling, DPM    [address] [place], [place] [address]                Office [phone]  Fax [phone]

## 2020-07-18 ENCOUNTER — Encounter: Payer: Self-pay | Admitting: Podiatry

## 2020-07-18 ENCOUNTER — Ambulatory Visit (INDEPENDENT_AMBULATORY_CARE_PROVIDER_SITE_OTHER): Payer: No Typology Code available for payment source | Admitting: Podiatry

## 2020-07-18 ENCOUNTER — Other Ambulatory Visit: Payer: Self-pay

## 2020-07-18 DIAGNOSIS — Z9889 Other specified postprocedural states: Secondary | ICD-10-CM

## 2020-07-18 DIAGNOSIS — M7662 Achilles tendinitis, left leg: Secondary | ICD-10-CM

## 2020-07-18 DIAGNOSIS — M7661 Achilles tendinitis, right leg: Secondary | ICD-10-CM

## 2020-07-18 NOTE — Progress Notes (Signed)
   Subjective:  Patient presents today status post gastroc aponeurosis lengthening right. DOS: 04/24/2020.  Patient states that she is doing very well.  She has no pain.  Patient is now out of the cam boot and wearing regular shoes.  She has no pain to the posterior leg.  No new complaints at this time  Past Medical History:  Diagnosis Date  . Anxiety   . Depression   . Hyperlipidemia   . Migraines   . Obesity   . PCOS (polycystic ovarian syndrome)       Objective/Physical Exam Neurovascular status intact.  Skin incisions appear to be well coapted and healed. No sign of infectious process noted. No dehiscence. No active bleeding noted.  Negative for any significant edema noted.  Ankle joint dorsiflexion is approximately 10 degrees past 90.  Full strength with plantarflexion.  Negative for any pain on palpation to the posterior Achilles tendon and gastroc  MRI impression 06/08/2020 1. Essentially complete tear of the proximal Achilles tendon 10.1 cm above the insertion with up to 3.6 cm retraction. Mild to moderate tendinosis of the mid and distal tendon. 2. Mild flexor tenosynovitis.  Assessment: 1. s/p gastroc aponeurosis lengthening right. DOS: 04/24/2020 2.  Reinjury right lower extremity.  DOI: 06/08/2020   Plan of Care:  1. Patient was evaluated.   2.  Patient may now begin full activity no restrictions 3.  Recommend good supportive shoes.  Patient admits to going barefoot around the house.  Recommend not going barefoot around the house and wearing good sneakers or shoes even in the house 4.  Patient scheduled to return to work 07/21/2020.   5.  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 Felecia Shelling, DPM    [address] [place], [place] [address]                Office [phone]  Fax [phone]

## 2020-07-24 ENCOUNTER — Encounter: Payer: Self-pay | Admitting: Family Medicine

## 2020-07-24 ENCOUNTER — Ambulatory Visit (INDEPENDENT_AMBULATORY_CARE_PROVIDER_SITE_OTHER): Payer: No Typology Code available for payment source | Admitting: Family Medicine

## 2020-07-24 ENCOUNTER — Other Ambulatory Visit: Payer: Self-pay

## 2020-07-24 VITALS — BP 112/75 | HR 77 | Temp 98.3°F | Ht 62.25 in | Wt 254.0 lb

## 2020-07-24 DIAGNOSIS — E039 Hypothyroidism, unspecified: Secondary | ICD-10-CM

## 2020-07-24 DIAGNOSIS — Z0000 Encounter for general adult medical examination without abnormal findings: Secondary | ICD-10-CM | POA: Diagnosis not present

## 2020-07-24 DIAGNOSIS — E282 Polycystic ovarian syndrome: Secondary | ICD-10-CM

## 2020-07-24 LAB — URINALYSIS, ROUTINE W REFLEX MICROSCOPIC
Bilirubin, UA: NEGATIVE
Glucose, UA: NEGATIVE
Ketones, UA: NEGATIVE
Leukocytes,UA: NEGATIVE
Nitrite, UA: NEGATIVE
Protein,UA: NEGATIVE
RBC, UA: NEGATIVE
Specific Gravity, UA: 1.025 (ref 1.005–1.030)
Urobilinogen, Ur: 0.2 mg/dL (ref 0.2–1.0)
pH, UA: 5.5 (ref 5.0–7.5)

## 2020-07-24 MED ORDER — METFORMIN HCL ER 500 MG PO TB24
1000.0000 mg | ORAL_TABLET | Freq: Every day | ORAL | 1 refills | Status: DC
  Filled 2020-07-24: qty 180, 90d supply, fill #0

## 2020-07-24 NOTE — Patient Instructions (Signed)
Health Maintenance, Female Adopting a healthy lifestyle and getting preventive care are important in promoting health and wellness. Ask your health care provider about:  The right schedule for you to have regular tests and exams.  Things you can do on your own to prevent diseases and keep yourself healthy. What should I know about diet, weight, and exercise? Eat a healthy diet  Eat a diet that includes plenty of vegetables, fruits, low-fat dairy products, and lean protein.  Do not eat a lot of foods that are high in solid fats, added sugars, or sodium.   Maintain a healthy weight Body mass index (BMI) is used to identify weight problems. It estimates body fat based on height and weight. Your health care provider can help determine your BMI and help you achieve or maintain a healthy weight. Get regular exercise Get regular exercise. This is one of the most important things you can do for your health. Most adults should:  Exercise for at least 150 minutes each week. The exercise should increase your heart rate and make you sweat (moderate-intensity exercise).  Do strengthening exercises at least twice a week. This is in addition to the moderate-intensity exercise.  Spend less time sitting. Even light physical activity can be beneficial. Watch cholesterol and blood lipids Have your blood tested for lipids and cholesterol at 34 years of age, then have this test every 5 years. Have your cholesterol levels checked more often if:  Your lipid or cholesterol levels are high.  You are older than 34 years of age.  You are at high risk for heart disease. What should I know about cancer screening? Depending on your health history and family history, you may need to have cancer screening at various ages. This may include screening for:  Breast cancer.  Cervical cancer.  Colorectal cancer.  Skin cancer.  Lung cancer. What should I know about heart disease, diabetes, and high blood  pressure? Blood pressure and heart disease  High blood pressure causes heart disease and increases the risk of stroke. This is more likely to develop in people who have high blood pressure readings, are of African descent, or are overweight.  Have your blood pressure checked: ? Every 3-5 years if you are 18-39 years of age. ? Every year if you are 40 years old or older. Diabetes Have regular diabetes screenings. This checks your fasting blood sugar level. Have the screening done:  Once every three years after age 40 if you are at a normal weight and have a low risk for diabetes.  More often and at a younger age if you are overweight or have a high risk for diabetes. What should I know about preventing infection? Hepatitis B If you have a higher risk for hepatitis B, you should be screened for this virus. Talk with your health care provider to find out if you are at risk for hepatitis B infection. Hepatitis C Testing is recommended for:  Everyone born from 1945 through 1965.  Anyone with known risk factors for hepatitis C. Sexually transmitted infections (STIs)  Get screened for STIs, including gonorrhea and chlamydia, if: ? You are sexually active and are younger than 34 years of age. ? You are older than 34 years of age and your health care provider tells you that you are at risk for this type of infection. ? Your sexual activity has changed since you were last screened, and you are at increased risk for chlamydia or gonorrhea. Ask your health care provider   if you are at risk.  Ask your health care provider about whether you are at high risk for HIV. Your health care provider may recommend a prescription medicine to help prevent HIV infection. If you choose to take medicine to prevent HIV, you should first get tested for HIV. You should then be tested every 3 months for as long as you are taking the medicine. Pregnancy  If you are about to stop having your period (premenopausal) and  you may become pregnant, seek counseling before you get pregnant.  Take 400 to 800 micrograms (mcg) of folic acid every day if you become pregnant.  Ask for birth control (contraception) if you want to prevent pregnancy. Osteoporosis and menopause Osteoporosis is a disease in which the bones lose minerals and strength with aging. This can result in bone fractures. If you are 65 years old or older, or if you are at risk for osteoporosis and fractures, ask your health care provider if you should:  Be screened for bone loss.  Take a calcium or vitamin D supplement to lower your risk of fractures.  Be given hormone replacement therapy (HRT) to treat symptoms of menopause. Follow these instructions at home: Lifestyle  Do not use any products that contain nicotine or tobacco, such as cigarettes, e-cigarettes, and chewing tobacco. If you need help quitting, ask your health care provider.  Do not use street drugs.  Do not share needles.  Ask your health care provider for help if you need support or information about quitting drugs. Alcohol use  Do not drink alcohol if: ? Your health care provider tells you not to drink. ? You are pregnant, may be pregnant, or are planning to become pregnant.  If you drink alcohol: ? Limit how much you use to 0-1 drink a day. ? Limit intake if you are breastfeeding.  Be aware of how much alcohol is in your drink. In the [place] one drink equals one 12 oz bottle of beer (355 mL), one 5 oz glass of wine (148 mL), or one 1 oz glass of hard liquor (44 mL). General instructions  Schedule regular health, dental, and eye exams.  Stay current with your vaccines.  Tell your health care provider if: ? You often feel depressed. ? You have ever been abused or do not feel safe at home. Summary  Adopting a healthy lifestyle and getting preventive care are important in promoting health and wellness.  Follow your health care provider's instructions about healthy  diet, exercising, and getting tested or screened for diseases.  Follow your health care provider's instructions on monitoring your cholesterol and blood pressure. This information is not intended to replace advice given to you by your health care provider. Make sure you discuss any questions you have with your health care provider. Document Revised: 03/29/2018 Document Reviewed: 03/29/2018 Elsevier Patient Education  2021 [employer]  

## 2020-07-24 NOTE — Progress Notes (Signed)
BP 112/75   Pulse 77   Temp 98.3 F (36.8 C)   Ht 5' 2.25" (1.581 m)   Wt 254 lb (115.2 kg)   SpO2 98%   BMI 46.08 kg/m    Subjective:    Patient ID: Cynthia George, female    DOB: [birth year], 35 y.o.   MRN: [id number]  HPI: Cynthia George is a 35 y.o. female presenting on 07/24/2020 for comprehensive medical examination. Current medical complaints include:    HYPOTHYROIDISM Thyroid control status:controlled Satisfied with current treatment? yes Medication side effects: no Medication compliance: excellent compliance Etiology of hypothyroidism:  Recent dose adjustment:no Fatigue: no Cold intolerance: no Heat intolerance: no Weight gain: no Weight loss: no Constipation: no Diarrhea/loose stools: no Palpitations: no Lower extremity edema: no Anxiety/depressed mood: no  She currently lives with: husband and son Menopausal Symptoms: no  Depression Screen done today and results listed below:  Depression screen [facility] 2/9 07/24/2020 06/22/2019 05/27/2016 05/26/2015  Decreased Interest 0 2 0 1  Down, Depressed, Hopeless 0 2 0 1  PHQ - 2 Score 0 4 0 2  Altered sleeping 0 0 1 -  Tired, decreased energy 0 2 1 -  Change in appetite 0 0 0 -  Feeling bad or failure about yourself  0 2 0 -  Trouble concentrating 0 0 0 -  Moving slowly or fidgety/restless 0 0 0 -  Suicidal thoughts 0 0 0 -  PHQ-9 Score 0 8 2 -  Difficult doing work/chores - Somewhat difficult - -  Some encounter information is confidential and restricted. Go to Review Flowsheets activity to see all data.    Past Medical History:  Past Medical History:  Diagnosis Date  . Anxiety   . Depression   . Hyperlipidemia   . Migraines   . Obesity   . PCOS (polycystic ovarian syndrome)     Surgical History:  Past Surgical History:  Procedure Laterality Date  . APPENDECTOMY  2013  . HUMERUS FRACTURE SURGERY Left 2002,2003   rod and screws placed and removed  . TONSILLECTOMY AND ADENOIDECTOMY  2004  . WISDOM TOOTH  EXTRACTION  2011   All    Medications:  Current Outpatient Medications on File Prior to Visit  Medication Sig  . levothyroxine (SYNTHROID) 100 MCG tablet TAKE 1 TABLET BY MOUTH DAILY BEFORE BREAKFAST.  Marland Kitchen letrozole (FEMARA) 2.5 MG tablet Take by mouth. (Patient not taking: Reported on 07/24/2020)   No current facility-administered medications on file prior to visit.    Allergies:  Allergies  Allergen Reactions  . Flagyl [Metronidazole] Rash  . Keflex [Cephalexin] Rash  . Septra [Sulfamethoxazole-Trimethoprim] Rash    Social History:  Social History   Socioeconomic History  . Marital status: Married    Spouse name: Not on file  . Number of children: Not on file  . Years of education: Not on file  . Highest education level: Not on file  Occupational History  . Not on file  Tobacco Use  . Smoking status: Never Smoker  . Smokeless tobacco: Never Used  Vaping Use  . Vaping Use: Never used  Substance and Sexual Activity  . Alcohol use: No  . Drug use: No  . Sexual activity: Yes    Birth control/protection: None  Other Topics Concern  . Not on file  Social History Narrative  . Not on file   Social Determinants of Health   Financial Resource Strain: Not on file  Food Insecurity: Not on file  Transportation Needs: Not on file  Physical Activity: Not on file  Stress: Not on file  Social Connections: Not on file  Intimate Partner Violence: Not on file   Social History   Tobacco Use  Smoking Status Never Smoker  Smokeless Tobacco Never Used   Social History   Substance and Sexual Activity  Alcohol Use No    Family History:  Family History  Problem Relation Age of Onset  . Heart disease Mother   . Hypertension Mother   . Hyperlipidemia Mother   . Heart attack Mother   . Hyperlipidemia Father   . Hypertension Father   . Diabetes Father   . Asthma Father   . Heart attack Father 31  . Hyperlipidemia Sister   . Asthma Sister   . Polycystic ovary syndrome  Sister   . Mental illness Sister        Depression  . Mental illness Brother        Depression  . Diabetes Maternal Grandmother   . Hyperlipidemia Maternal Grandmother   . Hypertension Maternal Grandmother   . Diabetes Maternal Grandfather   . Hypertension Maternal Grandfather   . Heart attack Maternal Grandfather   . Diabetes Paternal Grandmother   . Heart attack Paternal Grandfather     Past medical history, surgical history, medications, allergies, family history and social history reviewed with patient today and changes made to appropriate areas of the chart.   Review of Systems  Constitutional: Negative.   HENT: Negative.   Eyes: Negative.   Respiratory: Negative.   Cardiovascular: Negative.   Gastrointestinal: Negative.   Genitourinary: Negative.   Musculoskeletal: Negative.   Skin: Negative.   Neurological: Negative.   Endo/Heme/Allergies: Negative.   Psychiatric/Behavioral: Negative.    All other ROS negative except what is listed above and in the HPI.      Objective:    BP 112/75   Pulse 77   Temp 98.3 F (36.8 C)   Ht 5' 2.25" (1.581 m)   Wt 254 lb (115.2 kg)   SpO2 98%   BMI 46.08 kg/m   Wt Readings from Last 3 Encounters:  07/24/20 254 lb (115.2 kg)  06/08/20 249 lb 1.9 oz (113 kg)  12/26/19 250 lb (113.4 kg)    Physical Exam Vitals and nursing note reviewed.  Constitutional:      General: She is not in acute distress.    Appearance: Normal appearance. She is not ill-appearing, toxic-appearing or diaphoretic.  HENT:     Head: Normocephalic and atraumatic.     Right Ear: Tympanic membrane, ear canal and external ear normal. There is no impacted cerumen.     Left Ear: Tympanic membrane, ear canal and external ear normal. There is no impacted cerumen.     Nose: Nose normal. No congestion or rhinorrhea.     Mouth/Throat:     Mouth: Mucous membranes are moist.     Pharynx: Oropharynx is clear. No oropharyngeal exudate or posterior oropharyngeal  erythema.  Eyes:     General: No scleral icterus.       Right eye: No discharge.        Left eye: No discharge.     Extraocular Movements: Extraocular movements intact.     Conjunctiva/sclera: Conjunctivae normal.     Pupils: Pupils are equal, round, and reactive to light.  Neck:     Vascular: No carotid bruit.  Cardiovascular:     Rate and Rhythm: Normal rate and regular rhythm.  Pulses: Normal pulses.     Heart sounds: No murmur heard. No friction rub. No gallop.   Pulmonary:     Effort: Pulmonary effort is normal. No respiratory distress.     Breath sounds: Normal breath sounds. No stridor. No wheezing, rhonchi or rales.  Chest:     Chest wall: No tenderness.  Abdominal:     General: Abdomen is flat. Bowel sounds are normal. There is no distension.     Palpations: Abdomen is soft. There is no mass.     Tenderness: There is no abdominal tenderness. There is no right CVA tenderness, left CVA tenderness, guarding or rebound.     Hernia: No hernia is present.  Genitourinary:    Comments: Breast and pelvic exams deferred with shared decision making Musculoskeletal:        General: No swelling, tenderness, deformity or signs of injury.     Cervical back: Normal range of motion and neck supple. No rigidity. No muscular tenderness.     Right lower leg: No edema.     Left lower leg: No edema.  Lymphadenopathy:     Cervical: No cervical adenopathy.  Skin:    General: Skin is warm and dry.     Capillary Refill: Capillary refill takes less than 2 seconds.     Coloration: Skin is not jaundiced or pale.     Findings: No bruising, erythema, lesion or rash.  Neurological:     General: No focal deficit present.     Mental Status: She is alert and oriented to person, place, and time. Mental status is at baseline.     Cranial Nerves: No cranial nerve deficit.     Sensory: No sensory deficit.     Motor: No weakness.     Coordination: Coordination normal.     Gait: Gait normal.      Deep Tendon Reflexes: Reflexes normal.  Psychiatric:        Mood and Affect: Mood normal.        Behavior: Behavior normal.        Thought Content: Thought content normal.        Judgment: Judgment normal.     Results for orders placed or performed in visit on 06/22/19  CBC with Differential/Platelet  Result Value Ref Range   WBC 6.6 3.4 - 10.8 x10E3/uL   RBC 4.68 3.77 - 5.28 x10E6/uL   Hemoglobin 12.2 11.1 - 15.9 g/dL   Hematocrit 30.8 65.7 - 46.6 %   MCV 82 79 - 97 fL   MCH 26.1 (L) 26.6 - 33.0 pg   MCHC 31.9 31.5 - 35.7 g/dL   RDW 84.6 96.2 - 95.2 %   Platelets 311 150 - 450 x10E3/uL   Neutrophils 61 Not Estab. %   Lymphs 26 Not Estab. %   Monocytes 7 Not Estab. %   Eos 5 Not Estab. %   Basos 1 Not Estab. %   Neutrophils Absolute 4.1 1.4 - 7.0 x10E3/uL   Lymphocytes Absolute 1.7 0.7 - 3.1 x10E3/uL   Monocytes Absolute 0.4 0.1 - 0.9 x10E3/uL   EOS (ABSOLUTE) 0.3 0.0 - 0.4 x10E3/uL   Basophils Absolute 0.1 0.0 - 0.2 x10E3/uL   Immature Granulocytes 0 Not Estab. %   Immature Grans (Abs) 0.0 0.0 - 0.1 x10E3/uL  Comprehensive metabolic panel  Result Value Ref Range   Glucose 89 65 - 99 mg/dL   BUN 10 6 - 20 mg/dL   Creatinine, Ser 8.41 0.57 - 1.00 mg/dL  GFR calc non Af Amer 118 >59 mL/min/1.73   GFR calc Af Amer 137 >59 mL/min/1.73   BUN/Creatinine Ratio 16 9 - 23   Sodium 140 134 - 144 mmol/L   Potassium 4.8 3.5 - 5.2 mmol/L   Chloride 104 96 - 106 mmol/L   CO2 25 20 - 29 mmol/L   Calcium 9.4 8.7 - 10.2 mg/dL   Total Protein 7.0 6.0 - 8.5 g/dL   Albumin 4.2 3.8 - 4.8 g/dL   Globulin, Total 2.8 1.5 - 4.5 g/dL   Albumin/Globulin Ratio 1.5 1.2 - 2.2   Bilirubin Total 0.6 0.0 - 1.2 mg/dL   Alkaline Phosphatase 55 39 - 117 IU/L   AST 26 0 - 40 IU/L   ALT 49 (H) 0 - 32 IU/L  Lipid Panel w/o Chol/HDL Ratio  Result Value Ref Range   Cholesterol, Total 236 (H) 100 - 199 mg/dL   Triglycerides 779 (H) 0 - 149 mg/dL   HDL 25 (L) >39 mg/dL   VLDL Cholesterol Cal 51 (H)  5 - 40 mg/dL   LDL Chol Calc (NIH) 030 (H) 0 - 99 mg/dL  TSH  Result Value Ref Range   TSH 1.240 0.450 - 4.500 uIU/mL  UA/M w/rflx Culture, Routine   Specimen: Urine   URINE  Result Value Ref Range   Specific Gravity, UA 1.025 1.005 - 1.030   pH, UA 5.0 5.0 - 7.5   Color, UA Yellow Yellow   Appearance Ur Clear Clear   Leukocytes,UA Negative Negative   Protein,UA Negative Negative/Trace   Glucose, UA Negative Negative   Ketones, UA Negative Negative   RBC, UA Negative Negative   Bilirubin, UA Negative Negative   Urobilinogen, Ur 0.2 0.2 - 1.0 mg/dL   Nitrite, UA Negative Negative      Assessment & Plan:   Problem List Items Addressed This Visit      Endocrine   PCOS (polycystic ovarian syndrome)    Will restart her metformin. Call with any concerns. Continue to monitor. Call with any concerns.       Hypothyroid    Rechecking labs today. Await results. Treat as needed.        Other Visit Diagnoses    Routine general medical examination at a health care facility    -  Primary   Vaccines up to date. Screening labs checked today. Pap up to date. Conitnue diet and exercise. Call with any concerns. Continue to monitor.    Relevant Orders   CBC with Differential/Platelet   Comprehensive metabolic panel   Lipid Panel w/o Chol/HDL Ratio   Urinalysis, Routine w reflex microscopic   TSH       Follow up plan: Return in about 1 year (around 07/24/2021) for physical.   LABORATORY TESTING:  - Pap smear: up to date  IMMUNIZATIONS:   - Tdap: Tetanus vaccination status reviewed: last tetanus booster within 10 years. - Influenza: Up to date - Pneumovax: Not applicable - COVID: Up to date  PATIENT COUNSELING:   Advised to take 1 mg of folate supplement per day if capable of pregnancy.   Sexuality: Discussed sexually transmitted diseases, partner selection, use of condoms, avoidance of unintended pregnancy  and contraceptive alternatives.   Advised to avoid cigarette  smoking.  I discussed with the patient that most people either abstain from alcohol or drink within safe limits (<=14/week and <=4 drinks/occasion for males, <=7/weeks and <= 3 drinks/occasion for females) and that the risk for alcohol disorders and other  health effects rises proportionally with the number of drinks per week and how often a drinker exceeds daily limits.  Discussed cessation/primary prevention of drug use and availability of treatment for abuse.   Diet: Encouraged to adjust caloric intake to maintain  or achieve ideal body weight, to reduce intake of dietary saturated fat and total fat, to limit sodium intake by avoiding high sodium foods and not adding table salt, and to maintain adequate dietary potassium and calcium preferably from fresh fruits, vegetables, and low-fat dairy products.    stressed the importance of regular exercise  Injury prevention: Discussed safety belts, safety helmets, smoke detector, smoking near bedding or upholstery.   Dental health: Discussed importance of regular tooth brushing, flossing, and dental visits.    NEXT PREVENTATIVE PHYSICAL DUE IN 1 YEAR. Return in about 1 year (around 07/24/2021) for physical.

## 2020-07-24 NOTE — Assessment & Plan Note (Signed)
Will restart her metformin. Call with any concerns. Continue to monitor. Call with any concerns.

## 2020-07-24 NOTE — Assessment & Plan Note (Signed)
Rechecking labs today. Await results. Treat as needed.  °

## 2020-07-25 ENCOUNTER — Other Ambulatory Visit: Payer: Self-pay | Admitting: Family Medicine

## 2020-07-25 ENCOUNTER — Other Ambulatory Visit: Payer: Self-pay

## 2020-07-25 DIAGNOSIS — E039 Hypothyroidism, unspecified: Secondary | ICD-10-CM

## 2020-07-25 LAB — CBC WITH DIFFERENTIAL/PLATELET
Basophils Absolute: 0.1 10*3/uL (ref 0.0–0.2)
Basos: 2 %
EOS (ABSOLUTE): 0.4 10*3/uL (ref 0.0–0.4)
Eos: 5 %
Hematocrit: 40.8 % (ref 34.0–46.6)
Hemoglobin: 13 g/dL (ref 11.1–15.9)
Immature Grans (Abs): 0 10*3/uL (ref 0.0–0.1)
Immature Granulocytes: 0 %
Lymphocytes Absolute: 1.9 10*3/uL (ref 0.7–3.1)
Lymphs: 27 %
MCH: 27 pg (ref 26.6–33.0)
MCHC: 31.9 g/dL (ref 31.5–35.7)
MCV: 85 fL (ref 79–97)
Monocytes Absolute: 0.6 10*3/uL (ref 0.1–0.9)
Monocytes: 8 %
Neutrophils Absolute: 4 10*3/uL (ref 1.4–7.0)
Neutrophils: 58 %
Platelets: 277 10*3/uL (ref 150–450)
RBC: 4.81 x10E6/uL (ref 3.77–5.28)
RDW: 13.6 % (ref 11.7–15.4)
WBC: 6.9 10*3/uL (ref 3.4–10.8)

## 2020-07-25 LAB — LIPID PANEL W/O CHOL/HDL RATIO
Cholesterol, Total: 243 mg/dL — ABNORMAL HIGH (ref 100–199)
HDL: 25 mg/dL — ABNORMAL LOW (ref >39)
LDL Chol Calc (NIH): 155 mg/dL — ABNORMAL HIGH (ref 0–99)
Triglycerides: 333 mg/dL — ABNORMAL HIGH (ref 0–149)
VLDL Cholesterol Cal: 63 mg/dL — ABNORMAL HIGH (ref 5–40)

## 2020-07-25 LAB — COMPREHENSIVE METABOLIC PANEL
ALT: 99 IU/L — ABNORMAL HIGH (ref 0–32)
AST: 42 IU/L — ABNORMAL HIGH (ref 0–40)
Albumin/Globulin Ratio: 1.4 (ref 1.2–2.2)
Albumin: 4.1 g/dL (ref 3.8–4.8)
Alkaline Phosphatase: 62 IU/L (ref 44–121)
BUN/Creatinine Ratio: 15 (ref 9–23)
BUN: 10 mg/dL (ref 6–20)
Bilirubin Total: 0.6 mg/dL (ref 0.0–1.2)
CO2: 22 mmol/L (ref 20–29)
Calcium: 9.3 mg/dL (ref 8.7–10.2)
Chloride: 103 mmol/L (ref 96–106)
Creatinine, Ser: 0.66 mg/dL (ref 0.57–1.00)
Globulin, Total: 2.9 g/dL (ref 1.5–4.5)
Glucose: 122 mg/dL — ABNORMAL HIGH (ref 65–99)
Potassium: 4.7 mmol/L (ref 3.5–5.2)
Sodium: 142 mmol/L (ref 134–144)
Total Protein: 7 g/dL (ref 6.0–8.5)
eGFR: 118 mL/min/{1.73_m2} (ref >59)

## 2020-07-25 LAB — TSH: TSH: 4.19 u[IU]/mL (ref 0.450–4.500)

## 2020-07-25 MED ORDER — VITAMIN D (ERGOCALCIFEROL) 1.25 MG (50000 UNIT) PO CAPS
50000.0000 [IU] | ORAL_CAPSULE | ORAL | 0 refills | Status: DC
  Filled 2020-07-25: qty 12, 84d supply, fill #0

## 2020-07-25 MED ORDER — LEVOTHYROXINE SODIUM 100 MCG PO TABS
ORAL_TABLET | Freq: Every day | ORAL | 3 refills | Status: DC
Start: 2020-07-25 — End: 2021-09-17
  Filled 2020-07-25: qty 90, 90d supply, fill #0
  Filled 2020-11-13: qty 90, 90d supply, fill #1
  Filled 2021-02-20: qty 90, 90d supply, fill #2
  Filled 2021-06-02: qty 90, 90d supply, fill #3

## 2020-07-28 ENCOUNTER — Other Ambulatory Visit: Payer: Self-pay

## 2020-10-13 ENCOUNTER — Ambulatory Visit (INDEPENDENT_AMBULATORY_CARE_PROVIDER_SITE_OTHER): Payer: No Typology Code available for payment source | Admitting: Obstetrics and Gynecology

## 2020-10-13 ENCOUNTER — Encounter: Payer: Self-pay | Admitting: Obstetrics and Gynecology

## 2020-10-13 ENCOUNTER — Other Ambulatory Visit: Payer: Self-pay

## 2020-10-13 VITALS — BP 120/76 | Ht 63.0 in | Wt 250.0 lb

## 2020-10-13 DIAGNOSIS — N970 Female infertility associated with anovulation: Secondary | ICD-10-CM | POA: Diagnosis not present

## 2020-10-13 DIAGNOSIS — E282 Polycystic ovarian syndrome: Secondary | ICD-10-CM

## 2020-10-13 DIAGNOSIS — E1169 Type 2 diabetes mellitus with other specified complication: Secondary | ICD-10-CM

## 2020-10-13 DIAGNOSIS — Z6841 Body Mass Index (BMI) 40.0 and over, adult: Secondary | ICD-10-CM

## 2020-10-13 DIAGNOSIS — N911 Secondary amenorrhea: Secondary | ICD-10-CM

## 2020-10-13 DIAGNOSIS — L680 Hirsutism: Secondary | ICD-10-CM

## 2020-10-13 NOTE — Progress Notes (Signed)
Obstetrics & Gynecology Office Visit   Chief Complaint: Secondary Amenorrha   History of Present Illness: 35 y.o. G1P1001 presenting for the evaluation of absent menstruation.  The patient report No LMP recorded. (Menstrual status: Irregular Periods)..  Preceding the current episode of amenorrhea, the patient's menstrual cycles had been irregular.  The patient is currently sexually active and is not using  any homronal medications  for contraception.  She does have  a history of PCOS .  The patient has not started any new medications coinciding with the onset of her symptoms.  The patient is interested in conceiving in the near future.  She does not exercise excessively.  Previously did ovulate on letrozole courses.  She has not had a menstrual cycle since October 2021.  PCOS/Cushings Wt Change:  stable Hirsutism: yes Acne: yes Balding: no Lipodystrophy:  no Acanthosis nigricans:  no Striae:  no  Hyperprolactinemia Galactorrhea: no Headaches: no Vision Changes:  no Prior obstetric hemorrhage: no  Thyroid Temperature Intolerance: no Constipation or Diarrhea: no Hair Thinning:  no Palpitations:  {yes/no:63)  Outlet Obstruction: Prior D&C: no Prior myomectomy: no Prior LEEP or CKC: no   Review of Systems: 10 point review of systems negative unless noted in HPI  Past Medical History:  Patient Active Problem List   Diagnosis Date Noted   Infertility associated with anovulation 06/23/2019   Major depression, single episode, in complete remission (HCC) 06/22/2019   Postpartum hypertension 11/18/2016   Gestational hypertension without significant proteinuria 11/01/2016   Morbid obesity (HCC) 07/27/2016    BMI>40. Early 1 hr GTT 148 with normal 3hr GTT. Need repeat 3 hour at 28 weeks groth scans q4 weeks in third trimester. Weekly antepartum testing at 36 weeks.     Hypothyroid 06/30/2016   Elevated LFTs 05/27/2016   HLD (hyperlipidemia)    Migraines    PCOS  (polycystic ovarian syndrome)     Past Surgical History:  Past Surgical History:  Procedure Laterality Date   APPENDECTOMY  2013   HUMERUS FRACTURE SURGERY Left 2002,2003   rod and screws placed and removed   TONSILLECTOMY AND ADENOIDECTOMY  2004   WISDOM TOOTH EXTRACTION  2011   All    Gynecologic History: No LMP recorded. (Menstrual status: Irregular Periods).  Obstetric History: G1P1001  Family History:  Family History  Problem Relation Age of Onset   Heart disease Mother    Hypertension Mother    Hyperlipidemia Mother    Heart attack Mother    Hyperlipidemia Father    Hypertension Father    Diabetes Father    Asthma Father    Heart attack Father 25   Hyperlipidemia Sister    Asthma Sister    Polycystic ovary syndrome Sister    Mental illness Sister        Depression   Mental illness Brother        Depression   Diabetes Maternal Grandmother    Hyperlipidemia Maternal Grandmother    Hypertension Maternal Grandmother    Diabetes Maternal Grandfather    Hypertension Maternal Grandfather    Heart attack Maternal Grandfather    Diabetes Paternal Grandmother    Heart attack Paternal Grandfather     Social History:  Social History   Socioeconomic History   Marital status: Married    Spouse name: Not on file   Number of children: Not on file   Years of education: Not on file   Highest education level: Not on file  Occupational History  Not on file  Tobacco Use   Smoking status: Never   Smokeless tobacco: Never  Vaping Use   Vaping Use: Never used  Substance and Sexual Activity   Alcohol use: No   Drug use: No   Sexual activity: Yes    Birth control/protection: None  Other Topics Concern   Not on file  Social History Narrative   Not on file   Social Determinants of Health   Financial Resource Strain: Not on file  Food Insecurity: Not on file  Transportation Needs: Not on file  Physical Activity: Not on file  Stress: Not on file  Social  Connections: Not on file  Intimate Partner Violence: Not on file    Allergies:  Allergies  Allergen Reactions   Flagyl [Metronidazole] Rash   Keflex [Cephalexin] Rash   Septra [Sulfamethoxazole-Trimethoprim] Rash    Medications: Prior to Admission medications   Medication Sig Start Date End Date Taking? Authorizing Provider  levothyroxine (SYNTHROID) 100 MCG tablet TAKE 1 TABLET BY MOUTH DAILY BEFORE BREAKFAST. 07/25/20 07/25/21 Yes Johnson, Megan P, DO  metFORMIN (GLUCOPHAGE-XR) 500 MG 24 hr tablet Take 2 tablets (1,000 mg total) by mouth daily with breakfast. 07/24/20  Yes Johnson, Megan P, DO  letrozole (FEMARA) 2.5 MG tablet Take by mouth. Patient not taking: Reported on 07/24/2020 12/04/19   [provider]    Physical Exam Blood pressure 120/76, height 5\' 3"  (1.6 m), weight 250 lb (113.4 kg). No LMP recorded. (Menstrual status: Irregular Periods). Body mass index is 44.29 kg/m.  General: NAD HEENT: normocephalic, anicteric Pulmonary: No increased work of breathing Neurologic: Grossly intact Psychiatric: mood appropriate, affect full  Female chaperone present for pelvic portion of the physical exam  Assessment: 35 y.o. G1P1001 presenting with secondary amenorrhea  Plan: Problem List Items Addressed This Visit       Endocrine   PCOS (polycystic ovarian syndrome) - Primary   Relevant Orders   17-Hydroxyprogesterone   Androstenedione   DHEA-sulfate   FSH/LH   Testosterone,Free and Total   Prolactin   Other Visit Diagnoses     Anovulation       Relevant Orders   17-Hydroxyprogesterone   Androstenedione   DHEA-sulfate   FSH/LH   Testosterone,Free and Total   Prolactin   Secondary amenorrhea       Relevant Orders   17-Hydroxyprogesterone   Androstenedione   DHEA-sulfate   FSH/LH   Testosterone,Free and Total   Prolactin   Type 2 diabetes mellitus with other specified complication, without long-term current use of insulin (HCC)       Class 3 severe  obesity without serious comorbidity with body mass index (BMI) of 40.0 to 44.9 in adult, unspecified obesity type [facility])       Female hirsutism       Relevant Orders   Testosterone,Free and Total       1) The risk long term risk of amenorrhea were discussed with the patient.  The role of unopposed estrogen in causing amenorrhea and the possible development of endometrial hyperplasia or carcinoma is discussed.  The risk of endometrial hyperplasia is linearly correlated with increasing BMI given the production of estrone by adipose tissue.    2) Although amenorrhea is the patients presenting complaint, we discussed that rather than simply addressing her symptom the goal of our work up would be aimed at identifying the underlying cause.  Disruption in the axis of any number of endocrine systems may evidence itself in the form of amenorrhea. - repeat  PCOS panel   3) Patient will be started on a 10 day provera challenge to assess for the presence of estrogen and rule out outlet obstruction.    4) No follow-ups on file.   Vena Austria, MD, [facility], [facility] 10/13/2020, 11:37 AM

## 2020-10-16 ENCOUNTER — Other Ambulatory Visit: Payer: Self-pay | Admitting: Obstetrics and Gynecology

## 2020-10-16 ENCOUNTER — Other Ambulatory Visit: Payer: Self-pay

## 2020-10-16 MED ORDER — MEDROXYPROGESTERONE ACETATE 10 MG PO TABS
10.0000 mg | ORAL_TABLET | Freq: Every day | ORAL | 2 refills | Status: DC
  Filled 2020-10-16: qty 10, 10d supply, fill #0

## 2020-10-17 ENCOUNTER — Other Ambulatory Visit: Payer: Self-pay

## 2020-10-17 LAB — FSH/LH
FSH: 4.2 m[IU]/mL
LH: 9.4 m[IU]/mL

## 2020-10-17 LAB — 17-HYDROXYPROGESTERONE: 17-Hydroxyprogesterone: 81 ng/dL

## 2020-10-17 LAB — DHEA-SULFATE: DHEA-SO4: 144 ug/dL (ref 84.8–378.0)

## 2020-10-17 LAB — TESTOSTERONE,FREE AND TOTAL
Testosterone, Free: 5.9 pg/mL — ABNORMAL HIGH (ref 0.0–4.2)
Testosterone: 62 ng/dL — ABNORMAL HIGH (ref 8–60)

## 2020-10-17 LAB — PROLACTIN: Prolactin: 14.6 ng/mL (ref 4.8–23.3)

## 2020-10-17 LAB — ANDROSTENEDIONE: Androstenedione: 281 ng/dL — ABNORMAL HIGH (ref 41–262)

## 2020-10-31 ENCOUNTER — Other Ambulatory Visit: Payer: Self-pay

## 2020-10-31 ENCOUNTER — Ambulatory Visit: Payer: Self-pay

## 2020-10-31 ENCOUNTER — Encounter: Payer: Self-pay | Admitting: Internal Medicine

## 2020-10-31 ENCOUNTER — Ambulatory Visit (INDEPENDENT_AMBULATORY_CARE_PROVIDER_SITE_OTHER): Payer: No Typology Code available for payment source | Admitting: Internal Medicine

## 2020-10-31 VITALS — BP 118/78 | HR 71 | Temp 98.8°F | Ht 62.99 in | Wt 253.0 lb

## 2020-10-31 DIAGNOSIS — M7918 Myalgia, other site: Secondary | ICD-10-CM

## 2020-10-31 MED ORDER — CYCLOBENZAPRINE HCL 10 MG PO TABS
10.0000 mg | ORAL_TABLET | Freq: Every day | ORAL | 0 refills | Status: DC
  Filled 2020-10-31: qty 15, 15d supply, fill #0

## 2020-10-31 NOTE — Progress Notes (Signed)
BP 118/78   Pulse 71   Temp 98.8 F (37.1 C) (Oral)   Ht 5' 2.99" (1.6 m)   Wt 253 lb (114.8 kg)   SpO2 98%   BMI 44.83 kg/m    Subjective:    Patient ID: Cynthia George, female    DOB: [birth year], 35 y.o.   MRN: [id number]  Chief Complaint  Patient presents with   Upper chest pain    Left side, turning head makes the pain worse as well as certain movements of the arm, has been for 3 days, Has had a lot going on for the past 2 to 3 weeks, with going to school, working and just purchasing a house.     HPI: Cynthia George is a 35 y.o. 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 The current episode started in the past 7 days. The problem occurs intermittently (6 - 7 /10 intermittentl. , if raises at least to 90 degrees hurts in the chest muscle.). The problem has been gradually improving since onset. The pain occurs in the context of recent emotional stress and recent physical stress (moving to a noew house clos on the 25th). The pain is mild. The symptoms are aggravated by exercise and any movement. Pertinent negatives include no chest pain, stiffness or swollen glands.   Chief Complaint  Patient presents with   Upper chest pain    Left side, turning head makes the pain worse as well as certain movements of the arm, has been for 3 days, Has had a lot going on for the past 2 to 3 weeks, with going to school, working and just purchasing a house.     Relevant past medical, surgical, family and social history reviewed and updated as indicated. Interim medical history since our last visit reviewed. Allergies and medications reviewed and updated.  Review of Systems  Cardiovascular:  Negative for chest pain.  Musculoskeletal:  Negative for stiffness.   Per HPI unless specifically indicated above     Objective:    BP 118/78   Pulse 71   Temp 98.8 F (37.1 C) (Oral)   Ht 5' 2.99" (1.6 m)   Wt 253 lb (114.8 kg)   SpO2 98%   BMI 44.83 kg/m    Wt Readings from Last 3 Encounters:  10/31/20 253 lb (114.8 kg)  10/13/20 250 lb (113.4 kg)  07/24/20 254 lb (115.2 kg)    Physical Exam Vitals and nursing note reviewed.  Constitutional:      General: She is not in acute distress.    Appearance: Normal appearance. She is not ill-appearing or diaphoretic.  Eyes:     Conjunctiva/sclera: Conjunctivae normal.  Pulmonary:     Breath sounds: No rhonchi.  Abdominal:     General: Abdomen is flat. Bowel sounds are normal. There is no distension.     Palpations: Abdomen is soft. There is no mass.     Tenderness: There is no abdominal tenderness. There is no guarding.  Musculoskeletal:        General: Tenderness present.     Comments: Left side of the chest wall pain worse on Movt of the left shoulder and abduction of the shoulder. No bruising noted. No lumps or masses noted  Skin:    General: Skin is warm and dry.     Coloration: Skin is not jaundiced or pale.     Findings: No bruising, erythema, lesion or rash.  Neurological:     Mental Status: She is alert.   Results for orders placed or performed in visit on 10/13/20  17-Hydroxyprogesterone  Result Value Ref Range   17-Hydroxyprogesterone 81 ng/dL  Androstenedione  Result Value Ref Range   Androstenedione 281 (H) 41 - 262 ng/dL  DHEA-sulfate  Result Value Ref Range   DHEA-SO4 144.0 84.8 - 378.0 ug/dL  FSH/LH  Result Value Ref Range   LH 9.4 mIU/mL   FSH 4.2 mIU/mL  Testosterone,Free and Total  Result Value Ref Range   Testosterone 62 (H) 8 - 60 ng/dL   Testosterone, Free 5.9 (H) 0.0 - 4.2 pg/mL  Prolactin  Result Value Ref Range   Prolactin 14.6 4.8 - 23.3 ng/mL        Current Outpatient Medications:    cyclobenzaprine (FLEXERIL) 10 MG tablet, Take 1 tablet (10 mg total) by mouth at bedtime., Disp: 15 tablet, Rfl: 0   letrozole (FEMARA) 2.5 MG tablet, Take by mouth. (Patient not taking: Reported on 07/24/2020), Disp: , Rfl:    levothyroxine (SYNTHROID) 100 MCG  tablet, TAKE 1 TABLET BY MOUTH DAILY BEFORE BREAKFAST., Disp: 90 tablet, Rfl: 3   medroxyPROGESTERone (PROVERA) 10 MG tablet, Take 1 tablet (10 mg total) by mouth daily. Use for ten days, Disp: 10 tablet, Rfl: 2   metFORMIN (GLUCOPHAGE-XR) 500 MG 24 hr tablet, Take 2 tablets (1,000 mg total) by mouth daily with breakfast., Disp: 180 tablet, Rfl: 1    Assessment & Plan:  Muskuloskeletal pain ? 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 Will need to use ibubrufen q 8 hrly for pain relief, otc bengay ointment and hot and cold compresses.   Problem List Items Addressed This Visit   None   No orders of the defined types were placed in this encounter.    Meds ordered this encounter  Medications   cyclobenzaprine (FLEXERIL) 10 MG tablet    Sig: Take 1 tablet (10 mg total) by mouth at bedtime.    Dispense:  15 tablet    Refill:  0     Follow up plan: No follow-ups on file.

## 2020-10-31 NOTE — Telephone Encounter (Signed)
Pt. Noticed pain under left collar bone Wednesday. Hurts with movement. No radiation of pain, no other symptoms. 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

## 2020-10-31 NOTE — Telephone Encounter (Signed)
FYI. Appt today.  

## 2020-10-31 NOTE — Telephone Encounter (Signed)
Reason for Disposition  [1] Chest pain lasts > 5 minutes AND [2] occurred > 3 days ago (72 hours) AND [3] NO chest pain or cardiac symptoms now  Answer Assessment - Initial Assessment Questions 1. LOCATION: "Where does it hurt?"       Left side under collar bone - worse with movement 2. RADIATION: "Does the pain go anywhere else?" (e.g., into neck, jaw, arms, back)     No 3. ONSET: "When did the chest pain begin?" (Minutes, hours or days)      Wednesday 4. PATTERN "Does the pain come and go, or has it been constant since it started?"  "Does it get worse with exertion?"      Comes and goes 5. DURATION: "How long does it last" (e.g., seconds, minutes, hours)     Minutes 6. SEVERITY: "How bad is the pain?"  (e.g., Scale 1-10; mild, moderate, or severe)    - MILD (1-3): doesn't interfere with normal activities     - MODERATE (4-7): interferes with normal activities or awakens from sleep    - SEVERE (8-10): excruciating pain, unable to do any normal activities       5-6 7. CARDIAC RISK FACTORS: "Do you have any history of heart problems or risk factors for heart disease?" (e.g., angina, prior heart attack; diabetes, high blood pressure, high cholesterol, smoker, or strong family history of heart disease)     No 8. PULMONARY RISK FACTORS: "Do you have any history of lung disease?"  (e.g., blood clots in lung, asthma, emphysema, birth control pills)     No 9. CAUSE: "What do you think is causing the chest pain?"     Muscle 10. OTHER SYMPTOMS: "Do you have any other symptoms?" (e.g., dizziness, nausea, vomiting, sweating, fever, difficulty breathing, cough)       No 11. PREGNANCY: "Is there any chance you are pregnant?" "When was your last menstrual period?"       No  Protocols used: Chest Pain-A-AH

## 2020-11-04 ENCOUNTER — Encounter: Payer: Self-pay | Admitting: Obstetrics and Gynecology

## 2020-11-04 NOTE — Progress Notes (Unsigned)
LMP 11/02/2020

## 2020-11-14 ENCOUNTER — Other Ambulatory Visit: Payer: Self-pay

## 2020-11-17 ENCOUNTER — Ambulatory Visit: Payer: No Typology Code available for payment source | Admitting: Obstetrics and Gynecology

## 2020-11-20 ENCOUNTER — Other Ambulatory Visit: Payer: Self-pay

## 2020-11-20 ENCOUNTER — Ambulatory Visit: Payer: No Typology Code available for payment source | Admitting: Obstetrics and Gynecology

## 2020-11-20 ENCOUNTER — Other Ambulatory Visit (INDEPENDENT_AMBULATORY_CARE_PROVIDER_SITE_OTHER): Payer: No Typology Code available for payment source | Admitting: Obstetrics and Gynecology

## 2020-11-20 MED ORDER — PHENTERMINE HCL 37.5 MG PO TABS
37.5000 mg | ORAL_TABLET | Freq: Every day | ORAL | 0 refills | Status: DC
  Filled 2020-11-20: qty 30, 30d supply, fill #0

## 2020-11-20 NOTE — Progress Notes (Signed)
No charge visit phentermine not sent yet.   Follow up in 4 week to assess weight loss Did have withdrawal bleed on provera.

## 2020-12-16 ENCOUNTER — Other Ambulatory Visit: Payer: Self-pay

## 2020-12-16 ENCOUNTER — Other Ambulatory Visit: Payer: Self-pay | Admitting: Obstetrics and Gynecology

## 2020-12-16 MED ORDER — PHENTERMINE HCL 37.5 MG PO TABS
37.5000 mg | ORAL_TABLET | Freq: Every day | ORAL | 0 refills | Status: DC
  Filled 2020-12-16 – 2020-12-18 (×2): qty 30, 30d supply, fill #0

## 2020-12-16 NOTE — Progress Notes (Unsigned)
Weight 240lbs down from 250lbs.  Refill phentermine

## 2020-12-18 ENCOUNTER — Other Ambulatory Visit: Payer: Self-pay

## 2021-01-01 ENCOUNTER — Ambulatory Visit
Admission: EM | Admit: 2021-01-01 | Discharge: 2021-01-01 | Disposition: A | Discharge status: Home / Self Care | Payer: No Typology Code available for payment source | Attending: Emergency Medicine | Admitting: Emergency Medicine

## 2021-01-01 ENCOUNTER — Ambulatory Visit (INDEPENDENT_AMBULATORY_CARE_PROVIDER_SITE_OTHER): Payer: No Typology Code available for payment source | Admitting: Nurse Practitioner

## 2021-01-01 ENCOUNTER — Other Ambulatory Visit: Payer: Self-pay

## 2021-01-01 ENCOUNTER — Encounter: Payer: Self-pay | Admitting: Nurse Practitioner

## 2021-01-01 ENCOUNTER — Ambulatory Visit: Payer: Self-pay

## 2021-01-01 VITALS — BP 123/80 | HR 70 | Temp 98.7°F | Wt 234.6 lb

## 2021-01-01 DIAGNOSIS — R079 Chest pain, unspecified: Secondary | ICD-10-CM | POA: Diagnosis not present

## 2021-01-01 MED ORDER — ALUM & MAG HYDROXIDE-SIMETH 200-200-20 MG/5ML PO SUSP
30.0000 mL | Freq: Once | ORAL | Status: AC
  Administered 2021-01-01: 30 mL via ORAL

## 2021-01-01 MED ORDER — OMEPRAZOLE 20 MG PO CPDR
20.0000 mg | DELAYED_RELEASE_CAPSULE | Freq: Every day | ORAL | 1 refills | Status: DC
  Filled 2021-01-01: qty 30, 30d supply, fill #0

## 2021-01-01 MED ORDER — LIDOCAINE VISCOUS HCL 2 % MT SOLN
15.0000 mL | Freq: Once | OROMUCOSAL | Status: AC
  Administered 2021-01-01: 15 mL via ORAL

## 2021-01-01 NOTE — Telephone Encounter (Signed)
FYI for appointment today.  

## 2021-01-01 NOTE — Telephone Encounter (Signed)
Reason for Disposition  [1] Chest pain lasts > 5 minutes AND [2] occurred > 3 days ago (72 hours) AND [3] NO chest pain or cardiac symptoms now  Answer Assessment - Initial Assessment Questions 1. LOCATION: "Where does it hurt?"      Middle under throat 2. RADIATION: "Does the pain go anywhere else?" (e.g., into neck, jaw, arms, back)     No 3. ONSET: "When did the chest pain begin?" (Minutes, hours or days)      Yesterday 4. PATTERN "Does the pain come and go, or has it been constant since it started?"  "Does it get worse with exertion?"      Comes and goes 5. DURATION: "How long does it last" (e.g., seconds, minutes, hours)     Hours 6. SEVERITY: "How bad is the pain?"  (e.g., Scale 1-10; mild, moderate, or severe)    - MILD (1-3): doesn't interfere with normal activities     - MODERATE (4-7): interferes with normal activities or awakens from sleep    - SEVERE (8-10): excruciating pain, unable to do any normal activities       2 7. CARDIAC RISK FACTORS: "Do you have any history of heart problems or risk factors for heart disease?" (e.g., angina, prior heart attack; diabetes, high blood pressure, high cholesterol, smoker, or strong family history of heart disease)     No 8. PULMONARY RISK FACTORS: "Do you have any history of lung disease?"  (e.g., blood clots in lung, asthma, emphysema, birth control pills)     No 9. CAUSE: "What do you think is causing the chest pain?"     Unsure 10. OTHER SYMPTOMS: "Do you have any other symptoms?" (e.g., dizziness, nausea, vomiting, sweating, fever, difficulty breathing, cough)       No 11. PREGNANCY: "Is there any chance you are pregnant?" "When was your last menstrual period?"       No  Protocols used: Chest Pain-A-AH

## 2021-01-01 NOTE — Telephone Encounter (Signed)
noted 

## 2021-01-01 NOTE — Assessment & Plan Note (Addendum)
Acute x1 day. Burning sensation with no radiation, shortness of breath, or other symptoms. Mostly resolved with GI cocktail at urgent care. EKG reviewed and showed NSR with no ST or T wave changes. Will treat for acid reflux with omeprazole daily. Discussed having her reach out to her GYN who is prescribing phentermine to determine if this could be a side effect of this medication. Discussed eating small meals, avoiding eating close to bedtime. Strict ER precautions given. Will check CMP, CBC, and TSH today.

## 2021-01-01 NOTE — Progress Notes (Signed)
Acute Office Visit  Subjective:    Patient ID: Cynthia George, female    DOB: 1985-10-18, 35 y.o.   MRN: [id number]  Chief Complaint  Patient presents with   Chest Pain    Patient states she is having burning in her chest. Patient states she was given a GI cocktail and EKG at the Urgent Care earlier today. Patient states the burning is not stopping her from doing every day activity, she just hasn't had that feeling before.     HPI Patient is in today for chest pain that started yesterday at work. She went to urgent care today and they did an EKG and gave her a GI cocktail. She states that after the GI cocktail, her pain has improved, but is not totally gone. She was told to go to the ER, but she had this appointment scheduled already. She has never had heart burn before and is unsure what this sensation feels like. She has not been eating large meals recently, she has been on phentermine for the last month and has lost 15 pounds.   CHEST PAIN Time since onset: Duration:days Onset: sudden Quality: burning Severity: 2/10 Location: substernal Radiation: none Episode duration: unsure Frequency: intermittent Related to exertion: no Activity when pain started: at work (works in [place]) Trauma: no Anxiety/recent stressors: yes Aggravating factors: laying down Alleviating factors: GI cocktail at urgent care Status: better Treatments attempted: nothing  Current pain status:  1/10 pain currently Shortness of breath: no Cough: no Nausea: no Diaphoresis: no Heartburn: no - unsure never had it Palpitations: no   Past Medical History:  Diagnosis Date   Anxiety    Depression    Hyperlipidemia    Migraines    Obesity    PCOS (polycystic ovarian syndrome)     Past Surgical History:  Procedure Laterality Date   APPENDECTOMY  2013   HUMERUS FRACTURE SURGERY Left 2002,2003   rod and screws placed and removed   TONSILLECTOMY AND ADENOIDECTOMY  2004   WISDOM TOOTH EXTRACTION  2011    All    Family History  Problem Relation Age of Onset   Heart disease Mother    Hypertension Mother    Hyperlipidemia Mother    Heart attack Mother    Hyperlipidemia Father    Hypertension Father    Diabetes Father    Asthma Father    Heart attack Father 67   Hyperlipidemia Sister    Asthma Sister    Polycystic ovary syndrome Sister    Mental illness Sister        Depression   Mental illness Brother        Depression   Diabetes Maternal Grandmother    Hyperlipidemia Maternal Grandmother    Hypertension Maternal Grandmother    Diabetes Maternal Grandfather    Hypertension Maternal Grandfather    Heart attack Maternal Grandfather    Diabetes Paternal Grandmother    Heart attack Paternal Grandfather     Social History   Socioeconomic History   Marital status: Married    Spouse name: Not on file   Number of children: Not on file   Years of education: Not on file   Highest education level: Not on file  Occupational History   Not on file  Tobacco Use   Smoking status: Never   Smokeless tobacco: Never  Vaping Use   Vaping Use: Never used  Substance and Sexual Activity   Alcohol use: No   Drug use: No   Sexual  activity: Yes    Birth control/protection: None  Other Topics Concern   Not on file  Social History Narrative   Not on file   Social Determinants of Health   Financial Resource Strain: Not on file  Food Insecurity: Not on file  Transportation Needs: Not on file  Physical Activity: Not on file  Stress: Not on file  Social Connections: Not on file  Intimate Partner Violence: Not on file    Outpatient Medications Prior to Visit  Medication Sig Dispense Refill   levothyroxine (SYNTHROID) 100 MCG tablet TAKE 1 TABLET BY MOUTH DAILY BEFORE BREAKFAST. 90 tablet 3   medroxyPROGESTERone (PROVERA) 10 MG tablet Take 1 tablet (10 mg total) by mouth daily. Use for ten days 10 tablet 2   phentermine (ADIPEX-P) 37.5 MG tablet Take 1 tablet (37.5 mg total) by mouth  daily before breakfast. 30 tablet 0   No facility-administered medications prior to visit.    Allergies  Allergen Reactions   Flagyl [Metronidazole] Rash   Keflex [Cephalexin] Rash   Septra [Sulfamethoxazole-Trimethoprim] Rash    Review of Systems  Constitutional:  Positive for fatigue.  HENT: Negative.    Respiratory: Negative.    Cardiovascular:  Positive for chest pain (substernal, burning).  Gastrointestinal: Negative.   Genitourinary: Negative.   Musculoskeletal: Negative.   Skin: Negative.   Neurological: Negative.       Objective:    Physical Exam Vitals and nursing note reviewed.  Constitutional:      General: She is not in acute distress.    Appearance: Normal appearance.  HENT:     Head: Normocephalic.  Eyes:     Conjunctiva/sclera: Conjunctivae normal.  Cardiovascular:     Rate and Rhythm: Normal rate and regular rhythm.     Pulses: Normal pulses.     Heart sounds: Normal heart sounds.  Pulmonary:     Effort: Pulmonary effort is normal.     Breath sounds: Normal breath sounds.  Abdominal:     Palpations: Abdomen is soft.     Tenderness: There is no abdominal tenderness.  Musculoskeletal:        General: No swelling, tenderness or signs of injury.     Cervical back: Normal range of motion.     Right lower leg: No edema.     Left lower leg: No edema.  Skin:    General: Skin is warm.  Neurological:     General: No focal deficit present.     Mental Status: She is alert and oriented to person, place, and time.  Psychiatric:        Mood and Affect: Mood normal.        Behavior: Behavior normal.        Thought Content: Thought content normal.        Judgment: Judgment normal.    BP 123/80   Pulse 70   Temp 98.7 F (37.1 C) (Oral)   Wt 234 lb 9.6 oz (106.4 kg)   SpO2 98%   BMI 41.57 kg/m  Wt Readings from Last 3 Encounters:  01/01/21 234 lb 9.6 oz (106.4 kg)  10/31/20 253 lb (114.8 kg)  10/13/20 250 lb (113.4 kg)    Health Maintenance Due   Topic Date Due   URINE MICROALBUMIN  Never done   Hepatitis C Screening  Never done   COVID-19 Vaccine (3 - Booster for Pfizer series) 05/30/2020   INFLUENZA VACCINE  11/17/2020    There are no preventive care reminders to display for  this patient.   Lab Results  Component Value Date   TSH 4.190 07/24/2020   Lab Results  Component Value Date   WBC 6.9 07/24/2020   HGB 13.0 07/24/2020   HCT 40.8 07/24/2020   MCV 85 07/24/2020   PLT 277 07/24/2020   Lab Results  Component Value Date   NA 142 07/24/2020   K 4.7 07/24/2020   CO2 22 07/24/2020   GLUCOSE 122 (H) 07/24/2020   BUN 10 07/24/2020   CREATININE 0.66 07/24/2020   BILITOT 0.6 07/24/2020   ALKPHOS 62 07/24/2020   AST 42 (H) 07/24/2020   ALT 99 (H) 07/24/2020   PROT 7.0 07/24/2020   ALBUMIN 4.1 07/24/2020   CALCIUM 9.3 07/24/2020   ANIONGAP 8 11/18/2016   EGFR 118 07/24/2020   Lab Results  Component Value Date   CHOL 243 (H) 07/24/2020   Lab Results  Component Value Date   HDL 25 (L) 07/24/2020   Lab Results  Component Value Date   LDLCALC 155 (H) 07/24/2020   Lab Results  Component Value Date   TRIG 333 (H) 07/24/2020   No results found for: CHOLHDL Lab Results  Component Value Date   HGBA1C 5.3 02/13/2018       Assessment & Plan:   Problem List Items Addressed This Visit       Other   Chest pain - Primary    Acute x1 day. Burning sensation with no radiation, shortness of breath, or other symptoms. Mostly resolved with GI cocktail at urgent care. EKG reviewed and showed NSR with no ST or T wave changes. Will treat for acid reflux with omeprazole daily. Discussed having her reach out to her GYN who is prescribing phentermine to determine if this could be a side effect of this medication. Discussed eating small meals, avoiding eating close to bedtime. Strict ER precautions given. Will check CMP, CBC, and TSH today.       Relevant Orders   Comp Met (CMET)   CBC with Differential   TSH      Meds ordered this encounter  Medications   omeprazole (PRILOSEC) 20 MG capsule    Sig: Take 1 capsule (20 mg total) by mouth daily.    Dispense:  30 capsule    Refill:  [place], NP

## 2021-01-01 NOTE — ED Triage Notes (Signed)
Pt c/o center chest burning sensation since 3p yesterday after eating. Denies pain, radiating, SOB, or diaphoresis. States has been on phentermine x6wks.

## 2021-01-01 NOTE — ED Provider Notes (Signed)
Renaldo Fiddler    CSN: [id number] Arrival date & time: 01/01/21  0848      History   Chief Complaint Chief Complaint  Patient presents with   Chest Pain    HPI Cynthia George is a 35 y.o. female.  Patient presents with "burning" in her chest since yesterday afternoon.  The burning started after she ate meatloaf for lunch.  She denies numbness, weakness, shortness of breath, nausea, vomiting, or other symptoms.  She started taking phentermine 6 weeks ago.  Her medical history includes hypertension, hyperlipidemia, morbid obesity, migraine headaches, hypothyroidism, PCOS, depression, anxiety.  Patient reports both her mother and father had heart attacks at young ages; she states her mother was 80 at the time.  The history is provided by the patient and medical records.   Past Medical History:  Diagnosis Date   Anxiety    Depression    Hyperlipidemia    Migraines    Obesity    PCOS (polycystic ovarian syndrome)     Patient Active Problem List   Diagnosis Date Noted   Infertility associated with anovulation 06/23/2019   Major depression, single episode, in complete remission (HCC) 06/22/2019   Postpartum hypertension 11/18/2016   Gestational hypertension without significant proteinuria 11/01/2016   Morbid obesity (HCC) 07/27/2016   Hypothyroid 06/30/2016   Elevated LFTs 05/27/2016   HLD (hyperlipidemia)    Migraines    PCOS (polycystic ovarian syndrome)     Past Surgical History:  Procedure Laterality Date   APPENDECTOMY  2013   HUMERUS FRACTURE SURGERY Left 2002,2003   rod and screws placed and removed   TONSILLECTOMY AND ADENOIDECTOMY  2004   WISDOM TOOTH EXTRACTION  2011   All    OB History     Gravida  1   Para  1   Term  1   Preterm      AB      Living  1      SAB      IAB      Ectopic      Multiple  0   Live Births  1            Home Medications    Prior to Admission medications   Medication Sig Start Date End Date  Taking? Authorizing Provider  levothyroxine (SYNTHROID) 100 MCG tablet TAKE 1 TABLET BY MOUTH DAILY BEFORE BREAKFAST. 07/25/20 07/25/21  Johnson, Megan P, DO  medroxyPROGESTERone (PROVERA) 10 MG tablet Take 1 tablet (10 mg total) by mouth daily. Use for ten days 10/16/20   Vena Austria, MD  phentermine (ADIPEX-P) 37.5 MG tablet Take 1 tablet (37.5 mg total) by mouth daily before breakfast. 12/16/20   Vena Austria, MD    Family History Family History  Problem Relation Age of Onset   Heart disease Mother    Hypertension Mother    Hyperlipidemia Mother    Heart attack Mother    Hyperlipidemia Father    Hypertension Father    Diabetes Father    Asthma Father    Heart attack Father 76   Hyperlipidemia Sister    Asthma Sister    Polycystic ovary syndrome Sister    Mental illness Sister        Depression   Mental illness Brother        Depression   Diabetes Maternal Grandmother    Hyperlipidemia Maternal Grandmother    Hypertension Maternal Grandmother    Diabetes Maternal Grandfather    Hypertension Maternal Grandfather  Heart attack Maternal Grandfather    Diabetes Paternal Grandmother    Heart attack Paternal Grandfather     Social History Social History   Tobacco Use   Smoking status: Never   Smokeless tobacco: Never  Vaping Use   Vaping Use: Never used  Substance Use Topics   Alcohol use: No   Drug use: No     Allergies   Flagyl [metronidazole], Keflex [cephalexin], and Septra [sulfamethoxazole-trimethoprim]   Review of Systems Review of Systems  Constitutional:  Negative for chills and fever.  Eyes:  Negative for pain and visual disturbance.  Respiratory:  Negative for cough and shortness of breath.   Cardiovascular:  Positive for chest pain. Negative for palpitations.  Gastrointestinal:  Negative for abdominal pain, nausea and vomiting.  Skin:  Negative for color change and rash.  Neurological:  Negative for dizziness, syncope, weakness,  light-headedness, numbness and headaches.  All other systems reviewed and are negative.   Physical Exam Triage Vital Signs ED Triage Vitals [01/01/21 0901]  Enc Vitals Group     BP 131/84     Pulse Rate 85     Resp 18     Temp 99 F (37.2 C)     Temp Source Oral     SpO2 96 %     Weight      Height      Head Circumference      Peak Flow      Pain Score      Pain Loc      Pain Edu?      Excl. in GC?    No data found.  Updated Vital Signs BP (!) 165/107 (BP Location: Left Arm)   Pulse 85   Temp 99 F (37.2 C) (Oral)   Resp 18   SpO2 96%   Visual Acuity Right Eye Distance:   Left Eye Distance:   Bilateral Distance:    Right Eye Near:   Left Eye Near:    Bilateral Near:     Physical Exam Vitals and nursing note reviewed.  Constitutional:      General: She is not in acute distress.    Appearance: She is well-developed. She is obese. She is not ill-appearing.  HENT:     Head: Normocephalic and atraumatic.     Mouth/Throat:     Mouth: Mucous membranes are moist.  Eyes:     Conjunctiva/sclera: Conjunctivae normal.  Cardiovascular:     Rate and Rhythm: Normal rate and regular rhythm.     Heart sounds: Normal heart sounds.  Pulmonary:     Effort: Pulmonary effort is normal. No respiratory distress.     Breath sounds: Normal breath sounds.  Abdominal:     Palpations: Abdomen is soft.     Tenderness: There is no abdominal tenderness.  Musculoskeletal:     Cervical back: Neck supple.  Skin:    General: Skin is warm and dry.     Findings: No bruising, erythema, lesion or rash.  Neurological:     General: No focal deficit present.     Mental Status: She is alert and oriented to person, place, and time.     Sensory: No sensory deficit.     Motor: No weakness.     Gait: Gait normal.  Psychiatric:        Mood and Affect: Mood normal.        Behavior: Behavior normal.     UC Treatments / Results  Labs (all labs ordered are  listed, but only abnormal  results are displayed) Labs Reviewed - No data to display  EKG   Radiology No results found.  Procedures Procedures (including critical care time)  Medications Ordered in UC Medications  alum & mag hydroxide-simeth (MAALOX/MYLANTA) 200-200-20 MG/5ML suspension 30 mL (30 mLs Oral Given 01/01/21 1011)    And  lidocaine (XYLOCAINE) 2 % viscous mouth solution 15 mL (15 mLs Oral Given 01/01/21 1011)    Initial Impression / Assessment and Plan / UC Course  I have reviewed the triage vital signs and the nursing notes.  Pertinent labs & imaging results that were available during my care of the patient were reviewed by me and considered in my medical decision making (see chart for details).  Chest pain.  EKG shows sinus rhythm, rate 76, no ST elevation, compared to previous from 2019.  The "burning" chest pain improved with the GI cocktail but did not resolve.  Discussed with patient the limitations of evaluation of chest pain in an urgent care setting.  Sending her to the ED for evaluation.  She declines EMS and states she is stable to drive herself to the ED.   Final Clinical Impressions(s) / UC Diagnoses   Final diagnoses:  Chest pain, unspecified type     Discharge Instructions      Go to the emergency department for evaluation of your chest pain.     ED Prescriptions   None    PDMP not reviewed this encounter.   Mickie Bail, NP 01/01/21 1027

## 2021-01-01 NOTE — Telephone Encounter (Signed)
Pt. Reports after eating lunch yesterday, she started having burning in her chest. Below her throat. No radiation. No shortness of breath, no nausea or dizziness. Comes and goes. Pain 2/10. States "I just started phentermine so maybe that is causing this." Appointment made. Instructed to go to ED for worsening of symptoms.

## 2021-01-01 NOTE — Discharge Instructions (Addendum)
Go to the emergency department for evaluation of your chest pain.   

## 2021-01-02 LAB — CBC WITH DIFFERENTIAL/PLATELET
Basophils Absolute: 0.1 10*3/uL (ref 0.0–0.2)
Basos: 1 %
EOS (ABSOLUTE): 0.2 10*3/uL (ref 0.0–0.4)
Eos: 3 %
Hematocrit: 40.6 % (ref 34.0–46.6)
Hemoglobin: 13.5 g/dL (ref 11.1–15.9)
Immature Grans (Abs): 0 10*3/uL (ref 0.0–0.1)
Immature Granulocytes: 0 %
Lymphocytes Absolute: 1.9 10*3/uL (ref 0.7–3.1)
Lymphs: 26 %
MCH: 27.2 pg (ref 26.6–33.0)
MCHC: 33.3 g/dL (ref 31.5–35.7)
MCV: 82 fL (ref 79–97)
Monocytes Absolute: 0.6 10*3/uL (ref 0.1–0.9)
Monocytes: 8 %
Neutrophils Absolute: 4.5 10*3/uL (ref 1.4–7.0)
Neutrophils: 62 %
Platelets: 357 10*3/uL (ref 150–450)
RBC: 4.97 x10E6/uL (ref 3.77–5.28)
RDW: 13.2 % (ref 11.7–15.4)
WBC: 7.3 10*3/uL (ref 3.4–10.8)

## 2021-01-02 LAB — COMPREHENSIVE METABOLIC PANEL
ALT: 47 IU/L — ABNORMAL HIGH (ref 0–32)
AST: 27 IU/L (ref 0–40)
Albumin/Globulin Ratio: 1.4 (ref 1.2–2.2)
Albumin: 4.6 g/dL (ref 3.8–4.8)
Alkaline Phosphatase: 60 IU/L (ref 44–121)
BUN/Creatinine Ratio: 14 (ref 9–23)
BUN: 11 mg/dL (ref 6–20)
Bilirubin Total: 0.9 mg/dL (ref 0.0–1.2)
CO2: 25 mmol/L (ref 20–29)
Calcium: 9.7 mg/dL (ref 8.7–10.2)
Chloride: 100 mmol/L (ref 96–106)
Creatinine, Ser: 0.79 mg/dL (ref 0.57–1.00)
Globulin, Total: 3.2 g/dL (ref 1.5–4.5)
Glucose: 77 mg/dL (ref 65–99)
Potassium: 5 mmol/L (ref 3.5–5.2)
Sodium: 139 mmol/L (ref 134–144)
Total Protein: 7.8 g/dL (ref 6.0–8.5)
eGFR: 101 mL/min/{1.73_m2} (ref >59)

## 2021-01-02 LAB — TSH: TSH: 1.24 u[IU]/mL (ref 0.450–4.500)

## 2021-01-09 ENCOUNTER — Other Ambulatory Visit: Payer: Self-pay

## 2021-01-09 ENCOUNTER — Ambulatory Visit (INDEPENDENT_AMBULATORY_CARE_PROVIDER_SITE_OTHER): Payer: No Typology Code available for payment source | Admitting: Obstetrics and Gynecology

## 2021-01-09 ENCOUNTER — Encounter: Payer: Self-pay | Admitting: Obstetrics and Gynecology

## 2021-01-09 VITALS — BP 138/90 | Ht 63.0 in | Wt 237.0 lb

## 2021-01-09 DIAGNOSIS — Z6841 Body Mass Index (BMI) 40.0 and over, adult: Secondary | ICD-10-CM | POA: Diagnosis not present

## 2021-01-09 MED ORDER — PHENTERMINE HCL 37.5 MG PO TABS
37.5000 mg | ORAL_TABLET | Freq: Every day | ORAL | 0 refills | Status: DC
  Filled 2021-01-09 – 2021-01-21 (×2): qty 30, 30d supply, fill #0

## 2021-01-09 NOTE — Progress Notes (Signed)
Gynecology Office Visit  Chief Complaint:  Chief Complaint  Patient presents with   Follow-up    Weight loss - no concerns, RM 4    History of Present Illness: Patientis a 35 y.o. G20P1001 female, who presents for the evaluation of the desire to lose weight. She has lost 16 pounds 6  weeks. The patient states the following symptoms since starting her weight loss therapy: appetite suppression, energy, and weight loss.  The patient also reports no other ill effects. The patient specifically denies heart palpitations, anxiety, and insomnia.    Has not has a spontanous menstrual cycle yet  Review of Systems: 10 point review of systems negative unless otherwise noted in HPI  Past Medical History:  Past Medical History:  Diagnosis Date   Anxiety    Depression    Hyperlipidemia    Migraines    Obesity    PCOS (polycystic ovarian syndrome)     Past Surgical History:  Past Surgical History:  Procedure Laterality Date   APPENDECTOMY  2013   HUMERUS FRACTURE SURGERY Left 2002,2003   rod and screws placed and removed   TONSILLECTOMY AND ADENOIDECTOMY  2004   WISDOM TOOTH EXTRACTION  2011   All    Gynecologic History: No LMP recorded. (Menstrual status: Irregular Periods).  Obstetric History: G1P1001  Family History:  Family History  Problem Relation Age of Onset   Heart disease Mother    Hypertension Mother    Hyperlipidemia Mother    Heart attack Mother    Hyperlipidemia Father    Hypertension Father    Diabetes Father    Asthma Father    Heart attack Father 52   Hyperlipidemia Sister    Asthma Sister    Polycystic ovary syndrome Sister    Mental illness Sister        Depression   Mental illness Brother        Depression   Diabetes Maternal Grandmother    Hyperlipidemia Maternal Grandmother    Hypertension Maternal Grandmother    Diabetes Maternal Grandfather    Hypertension Maternal Grandfather    Heart attack Maternal Grandfather    Diabetes Paternal  Grandmother    Heart attack Paternal Grandfather     Social History:  Social History   Socioeconomic History   Marital status: Married    Spouse name: Not on file   Number of children: Not on file   Years of education: Not on file   Highest education level: Not on file  Occupational History   Not on file  Tobacco Use   Smoking status: Never   Smokeless tobacco: Never  Vaping Use   Vaping Use: Never used  Substance and Sexual Activity   Alcohol use: No   Drug use: No   Sexual activity: Yes    Birth control/protection: None  Other Topics Concern   Not on file  Social History Narrative   Not on file   Social Determinants of Health   Financial Resource Strain: Not on file  Food Insecurity: Not on file  Transportation Needs: Not on file  Physical Activity: Not on file  Stress: Not on file  Social Connections: Not on file  Intimate Partner Violence: Not on file    Allergies:  Allergies  Allergen Reactions   Flagyl [Metronidazole] Rash   Keflex [Cephalexin] Rash   Septra [Sulfamethoxazole-Trimethoprim] Rash    Medications: Prior to Admission medications   Medication Sig Start Date End Date Taking? Authorizing Provider  levothyroxine (  SYNTHROID) 100 MCG tablet TAKE 1 TABLET BY MOUTH DAILY BEFORE BREAKFAST. 07/25/20 07/25/21 Yes Johnson, Megan P, DO  medroxyPROGESTERone (PROVERA) 10 MG tablet Take 1 tablet (10 mg total) by mouth daily. Use for ten days 10/16/20  Yes Vena Austria, MD  omeprazole (PRILOSEC) 20 MG capsule Take 1 capsule (20 mg total) by mouth daily. 01/01/21  Yes McElwee, Lauren A, NP  phentermine (ADIPEX-P) 37.5 MG tablet Take 1 tablet (37.5 mg total) by mouth daily before breakfast. 12/16/20  Yes Vena Austria, MD    Physical Exam Blood pressure 138/90, height 5\' 3"  (1.6 m), weight 237 lb (107.5 kg). Wt Readings from Last 3 Encounters:  01/09/21 237 lb (107.5 kg)  01/01/21 234 lb 9.6 oz (106.4 kg)  10/31/20 253 lb (114.8 kg)  Body mass index is  41.98 kg/m.   General: NAD HEENT: normocephalic, anicteric Thyroid: no enlargement Pulmonary: no increased work of breathing Neurologic: Grossly intact Psychiatric: mood appropriate, affect full  Assessment: 35 y.o. G1P1001 medical weight loss, PCOS   Plan: Problem List Items Addressed This Visit   None Visit Diagnoses     Class 3 severe obesity without serious comorbidity with body mass index (BMI) of 40.0 to 44.9 in adult, unspecified obesity type (HCC)    -  Primary   Relevant Medications   phentermine (ADIPEX-P) 37.5 MG tablet       1) 1500 Calorie ADA Diet  2) Patient education given regarding appropriate lifestyle changes for weight loss including: regular physical activity, healthy coping strategies, caloric restriction and healthy eating patterns.  3) Patient will be started on weight loss medication. The risks and benefits and side effects of medication, such as Adipex (Phenteramine) ,  Tenuate (Diethylproprion), Belviq (lorcarsin), Contrave (buproprion/naltrexone), Qsymia (phentermine/topiramate), and Saxenda (liraglutide) is discussed. The pros and cons of suppressing appetite and boosting metabolism is discussed. Risks of tolerence and addiction is discussed for selected agents discussed. Use of medicine will ne short term, such as 3-4 months at a time followed by a period of time off of the medicine to avoid these risks and side effects for Adipex, Qsymia, and Tenuate discussed. Pt to call with any negative side effects and agrees to keep follow up appts.  4) Patient to take medication, with the benefits of appetite suppression and metabolism boost d/w pt, along with the side effects and risk factors of long term use that will be avoided with our use of short bursts of therapy. Rx provided.    5) 15 minutes face-to-face; with counseling/coordination of care > 50 percent of visit related to obesity and ongoing management/treatment   6) Return in about 4 weeks (around  02/06/2021) for medication follow up phone or in person.    02/08/2021, MD, Vena Austria [facility], [facility] 01/09/2021, 10:45 AM

## 2021-01-21 ENCOUNTER — Other Ambulatory Visit: Payer: Self-pay

## 2021-02-06 ENCOUNTER — Other Ambulatory Visit: Payer: Self-pay

## 2021-02-06 ENCOUNTER — Encounter: Payer: Self-pay | Admitting: Obstetrics and Gynecology

## 2021-02-06 ENCOUNTER — Ambulatory Visit (INDEPENDENT_AMBULATORY_CARE_PROVIDER_SITE_OTHER): Payer: No Typology Code available for payment source | Admitting: Obstetrics and Gynecology

## 2021-02-06 VITALS — Ht 63.0 in | Wt 232.0 lb

## 2021-02-06 DIAGNOSIS — Z6841 Body Mass Index (BMI) 40.0 and over, adult: Secondary | ICD-10-CM

## 2021-02-06 DIAGNOSIS — N911 Secondary amenorrhea: Secondary | ICD-10-CM | POA: Diagnosis not present

## 2021-02-06 MED ORDER — PHENTERMINE HCL 37.5 MG PO TABS
37.5000 mg | ORAL_TABLET | Freq: Every day | ORAL | 0 refills | Status: DC
  Filled 2021-02-06 – 2021-02-20 (×2): qty 30, 30d supply, fill #0

## 2021-02-06 MED ORDER — MEDROXYPROGESTERONE ACETATE 10 MG PO TABS
10.0000 mg | ORAL_TABLET | Freq: Every day | ORAL | 2 refills | Status: DC
  Filled 2021-02-06: qty 10, 10d supply, fill #0

## 2021-02-06 NOTE — Progress Notes (Signed)
I connected with Cynthia George on 02/06/21 at 11:10 AM EDT by telephone and verified that I am speaking with the correct person using two identifiers.   I discussed the limitations, risks, security and privacy concerns of performing an evaluation and management service by telephone and the availability of in person appointments. I also discussed with the patient that there may be a patient responsible charge related to this service. The patient expressed understanding and agreed to proceed.  The patient was at home I spoke with the patient from my workstation phone The names of people involved in this encounter were: Cynthia George , and Cynthia George   Gynecology Office Visit  Chief Complaint:  Chief Complaint  Patient presents with   Follow-up    Phone visit weight loss - no concerns.    History of Present Illness: Patientis a 35 y.o. G21P1001 female, who presents for the evaluation of the desire to lose weight. She has lost 5 pounds 1 months. The patient states the following symptoms since starting her weight loss therapy: appetite suppression, energy, and weight loss.  The patient also reports no other ill effects. The patient specifically denies heart palpitations, anxiety, and insomnia.    Review of Systems: 10 point review of systems negative unless otherwise noted in HPI  Past Medical History:  Past Medical History:  Diagnosis Date   Anxiety    Depression    Hyperlipidemia    Migraines    Obesity    PCOS (polycystic ovarian syndrome)     Past Surgical History:  Past Surgical History:  Procedure Laterality Date   APPENDECTOMY  2013   HUMERUS FRACTURE SURGERY Left 2002,2003   rod and screws placed and removed   TONSILLECTOMY AND ADENOIDECTOMY  2004   WISDOM TOOTH EXTRACTION  2011   All    Gynecologic History: No LMP recorded. (Menstrual status: Irregular Periods).  Obstetric History: G1P1001  Family History:  Family History  Problem Relation Age of  Onset   Heart disease Mother    Hypertension Mother    Hyperlipidemia Mother    Heart attack Mother    Hyperlipidemia Father    Hypertension Father    Diabetes Father    Asthma Father    Heart attack Father 110   Hyperlipidemia Sister    Asthma Sister    Polycystic ovary syndrome Sister    Mental illness Sister        Depression   Mental illness Brother        Depression   Diabetes Maternal Grandmother    Hyperlipidemia Maternal Grandmother    Hypertension Maternal Grandmother    Diabetes Maternal Grandfather    Hypertension Maternal Grandfather    Heart attack Maternal Grandfather    Diabetes Paternal Grandmother    Heart attack Paternal Grandfather     Social History:  Social History   Socioeconomic History   Marital status: Married    Spouse name: Not on file   Number of children: Not on file   Years of education: Not on file   Highest education level: Not on file  Occupational History   Not on file  Tobacco Use   Smoking status: Never   Smokeless tobacco: Never  Vaping Use   Vaping Use: Never used  Substance and Sexual Activity   Alcohol use: No   Drug use: No   Sexual activity: Yes    Birth control/protection: None  Other Topics Concern   Not on file  Social  History Narrative   Not on file   Social Determinants of Health   Financial Resource Strain: Not on file  Food Insecurity: Not on file  Transportation Needs: Not on file  Physical Activity: Not on file  Stress: Not on file  Social Connections: Not on file  Intimate Partner Violence: Not on file    Allergies:  Allergies  Allergen Reactions   Flagyl [Metronidazole] Rash   Keflex [Cephalexin] Rash   Septra [Sulfamethoxazole-Trimethoprim] Rash    Medications: Prior to Admission medications   Medication Sig Start Date End Date Taking? Authorizing Provider  levothyroxine (SYNTHROID) 100 MCG tablet TAKE 1 TABLET BY MOUTH DAILY BEFORE BREAKFAST. 07/25/20 07/25/21 Yes Johnson, Megan P, DO   medroxyPROGESTERone (PROVERA) 10 MG tablet Take 1 tablet (10 mg total) by mouth daily. Use for ten days 10/16/20  Yes Cynthia Austria, MD  omeprazole (PRILOSEC) 20 MG capsule Take 1 capsule (20 mg total) by mouth daily. 01/01/21  Yes McElwee, Lauren A, NP  phentermine (ADIPEX-P) 37.5 MG tablet Take 1 tablet (37.5 mg total) by mouth daily before breakfast. 01/09/21  Yes Cynthia Austria, MD    Physical Exam Height 5\' 3"  (1.6 m), weight 232 lb (105.2 kg). Wt Readings from Last 3 Encounters:  02/06/21 232 lb (105.2 kg)  01/09/21 237 lb (107.5 kg)  01/01/21 234 lb 9.6 oz (106.4 kg)  Body mass index is 41.1 kg/m. LMP July 16th 2002  No physical exam as this was a remote telephone visit to promote social distancing during the current COVID-19 Pandemic   Assessment: 35 y.o. G1P1001 follow up obesity   Plan: Problem List Items Addressed This Visit   None Visit Diagnoses     Class 3 severe obesity without serious comorbidity with body mass index (BMI) of 40.0 to 44.9 in adult, unspecified obesity type (HCC)    -  Primary   Relevant Medications   phentermine (ADIPEX-P) 37.5 MG tablet   Secondary amenorrhea           1) 1500 Calorie ADA Diet  2) Patient education given regarding appropriate lifestyle changes for weight loss including: regular physical activity, healthy coping strategies, caloric restriction and healthy eating patterns.  3)  Patient to take medication, with the benefits of appetite suppression and metabolism boost d/w pt, along with the side effects and risk factors of long term use that will be avoided with our use of short bursts of therapy. Rx provided.    4) Considering letrozole start next month, has not has menstrual cycle since July.  Will do 10 day course of provera  5) Telephone time 5:00 min  6) No follow-ups on file.    August, MD, [facility], [facility] 02/06/2021, 11:37 AM

## 2021-02-09 ENCOUNTER — Other Ambulatory Visit: Payer: Self-pay

## 2021-02-20 ENCOUNTER — Other Ambulatory Visit: Payer: Self-pay

## 2021-03-01 DIAGNOSIS — M25522 Pain in left elbow: Secondary | ICD-10-CM | POA: Insufficient documentation

## 2021-03-02 ENCOUNTER — Other Ambulatory Visit: Payer: Self-pay

## 2021-03-02 MED ORDER — METHYLPREDNISOLONE 4 MG PO TBPK
ORAL_TABLET | ORAL | 0 refills | Status: DC
  Filled 2021-03-02: qty 21, 6d supply, fill #0

## 2021-03-03 ENCOUNTER — Ambulatory Visit (INDEPENDENT_AMBULATORY_CARE_PROVIDER_SITE_OTHER): Payer: No Typology Code available for payment source | Admitting: Family Medicine

## 2021-03-03 ENCOUNTER — Other Ambulatory Visit: Payer: Self-pay

## 2021-03-03 ENCOUNTER — Encounter: Payer: Self-pay | Admitting: Family Medicine

## 2021-03-03 VITALS — BP 132/83 | HR 74 | Temp 98.4°F | Wt 227.6 lb

## 2021-03-03 DIAGNOSIS — M7712 Lateral epicondylitis, left elbow: Secondary | ICD-10-CM

## 2021-03-03 MED ORDER — BACLOFEN 10 MG PO TABS
10.0000 mg | ORAL_TABLET | Freq: Every evening | ORAL | 0 refills | Status: DC | PRN
  Filled 2021-03-03: qty 30, 30d supply, fill #0

## 2021-03-03 NOTE — Progress Notes (Signed)
BP 132/83   Pulse 74   Temp 98.4 F (36.9 C)   Wt 227 lb 9.6 oz (103.2 kg)   SpO2 98%   BMI 40.32 kg/m    Subjective:    Patient ID: Cynthia George, female    DOB: 12-23-1985, 35 y.o.   MRN: [id number]  HPI: Cynthia George is a 35 y.o. female  Chief Complaint  Patient presents with   Arm Pain    Patient states she began to feel pain in her left arm about a week ago. Pain radiates to wrist and fingers, feels numbness and tingling. Patient was seen at [facility] on Sunday    ARM PAIN Duration: about a week Location: L arm down into her hand Mechanism of injury: unknown Onset: sudden Severity: severe  Quality:  shooting and numbness and tingling Frequency: constant Radiation: yes- down her arm Aggravating factors: sleeping  Alleviating factors: nothing  Status: worse Treatments attempted: rest, ice, heat, APAP, ibuprofen, and aleve  Relief with NSAIDs?:  mild Swelling: yes Redness: no  Warmth: no Trauma: no Chest pain: no  Shortness of breath: no  Fever: no Decreased sensation: no Paresthesias: yes Weakness: yes  Relevant past medical, surgical, family and social history reviewed and updated as indicated. Interim medical history since our last visit reviewed. Allergies and medications reviewed and updated.  Review of Systems  Constitutional: Negative.   Respiratory: Negative.    Cardiovascular: Negative.   Gastrointestinal: Negative.   Musculoskeletal:  Positive for arthralgias and myalgias. Negative for back pain, gait problem, joint swelling, neck pain and neck stiffness.  Skin: Negative.   Neurological:  Positive for weakness and numbness. Negative for dizziness, tremors, seizures, syncope, facial asymmetry, speech difficulty, light-headedness and headaches.  Psychiatric/Behavioral: Negative.     Per HPI unless specifically indicated above     Objective:    BP 132/83   Pulse 74   Temp 98.4 F (36.9 C)   Wt 227 lb 9.6 oz (103.2 kg)   SpO2 98%    BMI 40.32 kg/m   Wt Readings from Last 3 Encounters:  03/03/21 227 lb 9.6 oz (103.2 kg)  02/06/21 232 lb (105.2 kg)  01/09/21 237 lb (107.5 kg)    Physical Exam Vitals and nursing note reviewed.  Constitutional:      General: She is not in acute distress.    Appearance: Normal appearance. She is not ill-appearing, toxic-appearing or diaphoretic.  HENT:     Head: Normocephalic and atraumatic.     Right Ear: External ear normal.     Left Ear: External ear normal.     Nose: Nose normal.     Mouth/Throat:     Mouth: Mucous membranes are moist.     Pharynx: Oropharynx is clear.  Eyes:     General: No scleral icterus.       Right eye: No discharge.        Left eye: No discharge.     Extraocular Movements: Extraocular movements intact.     Conjunctiva/sclera: Conjunctivae normal.     Pupils: Pupils are equal, round, and reactive to light.  Cardiovascular:     Rate and Rhythm: Normal rate and regular rhythm.     Pulses: Normal pulses.     Heart sounds: Normal heart sounds. No murmur heard.   No friction rub. No gallop.  Pulmonary:     Effort: Pulmonary effort is normal. No respiratory distress.     Breath sounds: Normal breath sounds. No stridor. No  wheezing, rhonchi or rales.  Chest:     Chest wall: No tenderness.  Musculoskeletal:        General: Tenderness (lateral epicondyle on L) present. No swelling, deformity or signs of injury.     Cervical back: Normal range of motion and neck supple.     Right lower leg: No edema.     Left lower leg: No edema.  Skin:    General: Skin is warm and dry.     Capillary Refill: Capillary refill takes less than 2 seconds.     Coloration: Skin is not jaundiced or pale.     Findings: No bruising, erythema, lesion or rash.  Neurological:     General: No focal deficit present.     Mental Status: She is alert and oriented to person, place, and time. Mental status is at baseline.  Psychiatric:        Mood and Affect: Mood normal.         Behavior: Behavior normal.        Thought Content: Thought content normal.        Judgment: Judgment normal.    Results for orders placed or performed in visit on 01/01/21  Comp Met (CMET)  Result Value Ref Range   Glucose 77 65 - 99 mg/dL   BUN 11 6 - 20 mg/dL   Creatinine, Ser 0.79 0.57 - 1.00 mg/dL   eGFR 101 >59 mL/min/1.73   BUN/Creatinine Ratio 14 9 - 23   Sodium 139 134 - 144 mmol/L   Potassium 5.0 3.5 - 5.2 mmol/L   Chloride 100 96 - 106 mmol/L   CO2 25 20 - 29 mmol/L   Calcium 9.7 8.7 - 10.2 mg/dL   Total Protein 7.8 6.0 - 8.5 g/dL   Albumin 4.6 3.8 - 4.8 g/dL   Globulin, Total 3.2 1.5 - 4.5 g/dL   Albumin/Globulin Ratio 1.4 1.2 - 2.2   Bilirubin Total 0.9 0.0 - 1.2 mg/dL   Alkaline Phosphatase 60 44 - 121 IU/L   AST 27 0 - 40 IU/L   ALT 47 (H) 0 - 32 IU/L  CBC with Differential  Result Value Ref Range   WBC 7.3 3.4 - 10.8 x10E3/uL   RBC 4.97 3.77 - 5.28 x10E6/uL   Hemoglobin 13.5 11.1 - 15.9 g/dL   Hematocrit 40.6 34.0 - 46.6 %   MCV 82 79 - 97 fL   MCH 27.2 26.6 - 33.0 pg   MCHC 33.3 31.5 - 35.7 g/dL   RDW 13.2 11.7 - 15.4 %   Platelets 357 150 - 450 x10E3/uL   Neutrophils 62 Not Estab. %   Lymphs 26 Not Estab. %   Monocytes 8 Not Estab. %   Eos 3 Not Estab. %   Basos 1 Not Estab. %   Neutrophils Absolute 4.5 1.4 - 7.0 x10E3/uL   Lymphocytes Absolute 1.9 0.7 - 3.1 x10E3/uL   Monocytes Absolute 0.6 0.1 - 0.9 x10E3/uL   EOS (ABSOLUTE) 0.2 0.0 - 0.4 x10E3/uL   Basophils Absolute 0.1 0.0 - 0.2 x10E3/uL   Immature Granulocytes 0 Not Estab. %   Immature Grans (Abs) 0.0 0.0 - 0.1 x10E3/uL  TSH  Result Value Ref Range   TSH 1.240 0.450 - 4.500 uIU/mL      Assessment & Plan:   Problem List Items Addressed This Visit   None Visit Diagnoses     Lateral epicondylitis of left elbow    -  Primary   Treated today with steroid injection  as below. Call with any concerns Continue to monitor closely   Relevant Medications   baclofen (LIORESAL) 10 MG tablet        Procedure: Left Lateral Epicondylitis Steroid Injection        Diagnosis:   ICD-10-CM   1. Lateral epicondylitis of left elbow  M77.12    Treated today with steroid injection as below. Call with any concerns Continue to monitor closely      Physician: _0 @ Consent:  Risks, benefits, and alternative treatments discussed and all questions were answered.  Patient elected to proceed and verbal consent obtained.  Description: Area prepped and draped using  semi-sterile technique.  After identifying area of maximal tenderness, A mixture of 1cc 1% lidocaine and 0.5cc Kenalog 40 was injected.  A bandage was then placed over the injection site. Complications: none Post Procedure Instructions: Wound care instructions discussed and patient was instructed to keep area clean and dry.  Signs and symptoms of infection discussed, patient agrees to contact the office ASAP should they occur.    Follow up plan: No follow-ups on file.

## 2021-03-04 ENCOUNTER — Ambulatory Visit: Payer: Self-pay

## 2021-03-04 ENCOUNTER — Other Ambulatory Visit: Payer: Self-pay | Admitting: Family Medicine

## 2021-03-04 ENCOUNTER — Other Ambulatory Visit: Payer: Self-pay

## 2021-03-04 MED ORDER — PREDNISONE 50 MG PO TABS
50.0000 mg | ORAL_TABLET | Freq: Every day | ORAL | 0 refills | Status: DC
  Filled 2021-03-04: qty 5, 5d supply, fill #0

## 2021-03-04 MED ORDER — GABAPENTIN 100 MG PO CAPS
100.0000 mg | ORAL_CAPSULE | Freq: Three times a day (TID) | ORAL | 3 refills | Status: DC
  Filled 2021-03-04: qty 90, 30d supply, fill #0

## 2021-03-04 NOTE — Telephone Encounter (Signed)
Pt. Reports she was seen yesterday for left arm pain. States the muscle relaxer "did not help at all. I was up all night." Pain is 9/10. Asking for further advise. Please call pt.'s work number.     Answer Assessment - Initial Assessment Questions 1. ONSET: "When did the pain start?"     Left arm pain 2. LOCATION: "Where is the pain located?"     Left arm 3. PAIN: "How bad is the pain?" (Scale 1-10; or mild, moderate, severe)   - MILD (1-3): doesn't interfere with normal activities   - MODERATE (4-7): interferes with normal activities (e.g., work or school) or awakens from sleep   - SEVERE (8-10): excruciating pain, unable to do any normal activities, unable to hold a cup of water     9 4. WORK OR EXERCISE: "Has there been any recent work or exercise that involved this part of the body?"     No 5. CAUSE: "What do you think is causing the arm pain?"     Unsure 6. OTHER SYMPTOMS: "Do you have any other symptoms?" (e.g., neck pain, swelling, rash, fever, numbness, weakness)     Weak grip 7. PREGNANCY: "Is there any chance you are pregnant?" "When was your last menstrual period?"     No  Protocols used: Arm Pain-A-AH

## 2021-03-04 NOTE — Telephone Encounter (Signed)
Called patient no answer, left VM to advise of provider advise and rx.

## 2021-03-04 NOTE — Telephone Encounter (Addendum)
I'm going to send some gabapentin and steroids in for her, but if pain is that bad, she may want to go to ER for faster eval.

## 2021-03-05 ENCOUNTER — Encounter: Payer: Self-pay | Admitting: Family Medicine

## 2021-03-13 ENCOUNTER — Other Ambulatory Visit: Payer: Self-pay

## 2021-03-13 MED ORDER — KETOROLAC TROMETHAMINE 10 MG PO TABS
ORAL_TABLET | ORAL | 0 refills | Status: DC
  Filled 2021-03-13: qty 12, 3d supply, fill #0

## 2021-03-13 MED ORDER — CYCLOBENZAPRINE HCL 5 MG PO TABS
ORAL_TABLET | ORAL | 0 refills | Status: DC
  Filled 2021-03-13: qty 30, 15d supply, fill #0

## 2021-03-13 MED ORDER — MELOXICAM 15 MG PO TABS
ORAL_TABLET | ORAL | 0 refills | Status: DC
  Filled 2021-03-13: qty 30, 30d supply, fill #0

## 2021-03-16 ENCOUNTER — Other Ambulatory Visit: Payer: Self-pay

## 2021-03-16 ENCOUNTER — Encounter (HOSPITAL_BASED_OUTPATIENT_CLINIC_OR_DEPARTMENT_OTHER): Payer: Self-pay

## 2021-03-16 DIAGNOSIS — Z79899 Other long term (current) drug therapy: Secondary | ICD-10-CM | POA: Insufficient documentation

## 2021-03-16 DIAGNOSIS — M5410 Radiculopathy, site unspecified: Secondary | ICD-10-CM | POA: Insufficient documentation

## 2021-03-16 DIAGNOSIS — E039 Hypothyroidism, unspecified: Secondary | ICD-10-CM | POA: Diagnosis not present

## 2021-03-16 DIAGNOSIS — M79602 Pain in left arm: Secondary | ICD-10-CM | POA: Diagnosis present

## 2021-03-16 NOTE — ED Triage Notes (Addendum)
Pt presents w/ongoing posterior neck pain radiating down into her Left arm x4 weeks. She has seen here PCP and [facility] for the same. She has received cortisone injections, Toradol injection, taking PO Toradol, baclofen, flexeril, gabapentin, tylenol, ibuprofen with no relief. Pt has had XR's but no CT scan

## 2021-03-17 ENCOUNTER — Emergency Department (HOSPITAL_BASED_OUTPATIENT_CLINIC_OR_DEPARTMENT_OTHER)
Admission: EM | Admit: 2021-03-17 | Discharge: 2021-03-17 | Disposition: A | Discharge status: Home / Self Care | Payer: No Typology Code available for payment source | Attending: Emergency Medicine | Admitting: Emergency Medicine

## 2021-03-17 ENCOUNTER — Ambulatory Visit
Admission: RE | Admit: 2021-03-17 | Discharge: 2021-03-17 | Disposition: A | Discharge status: Home / Self Care | Payer: No Typology Code available for payment source | Attending: Family Medicine | Admitting: Family Medicine

## 2021-03-17 ENCOUNTER — Other Ambulatory Visit: Payer: Self-pay

## 2021-03-17 ENCOUNTER — Encounter (HOSPITAL_BASED_OUTPATIENT_CLINIC_OR_DEPARTMENT_OTHER): Payer: Self-pay | Admitting: Emergency Medicine

## 2021-03-17 ENCOUNTER — Ambulatory Visit
Admission: RE | Admit: 2021-03-17 | Discharge: 2021-03-17 | Disposition: A | Discharge status: Home / Self Care | Payer: No Typology Code available for payment source | Source: Ambulatory Visit | Attending: Family Medicine | Admitting: Family Medicine

## 2021-03-17 ENCOUNTER — Encounter: Payer: Self-pay | Admitting: Family Medicine

## 2021-03-17 ENCOUNTER — Ambulatory Visit (INDEPENDENT_AMBULATORY_CARE_PROVIDER_SITE_OTHER): Payer: No Typology Code available for payment source | Admitting: Family Medicine

## 2021-03-17 VITALS — BP 135/79 | HR 64 | Temp 97.9°F | Wt 229.0 lb

## 2021-03-17 DIAGNOSIS — M792 Neuralgia and neuritis, unspecified: Secondary | ICD-10-CM

## 2021-03-17 IMAGING — DX DG CERVICAL SPINE COMPLETE 4+V
7 series · 7 of 7 positions shown · non-contrast
Comparison: None.

CLINICAL DATA: Radicular arm pain

EXAM:
CERVICAL SPINE - COMPLETE 4+ VIEW

[c-spine lat]
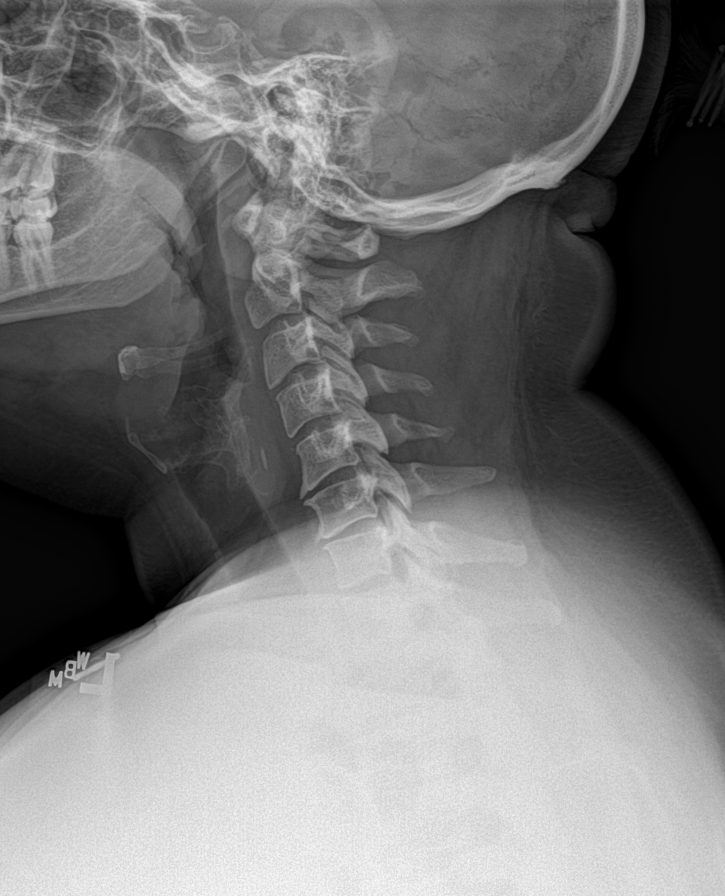

[c-spine obl (1 of 3)]
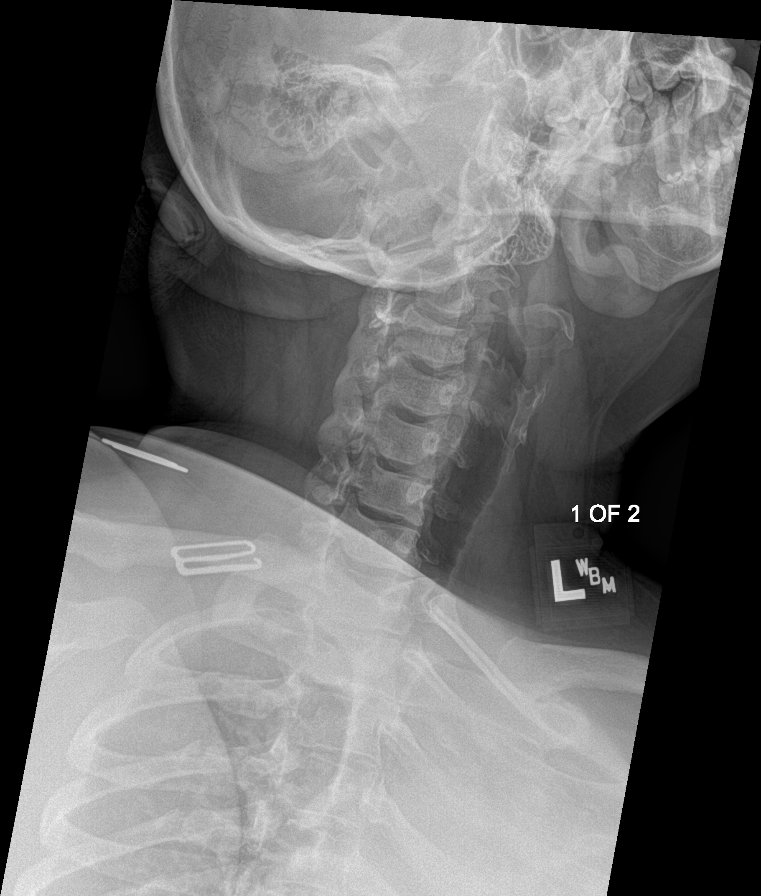

[c-spine ap]
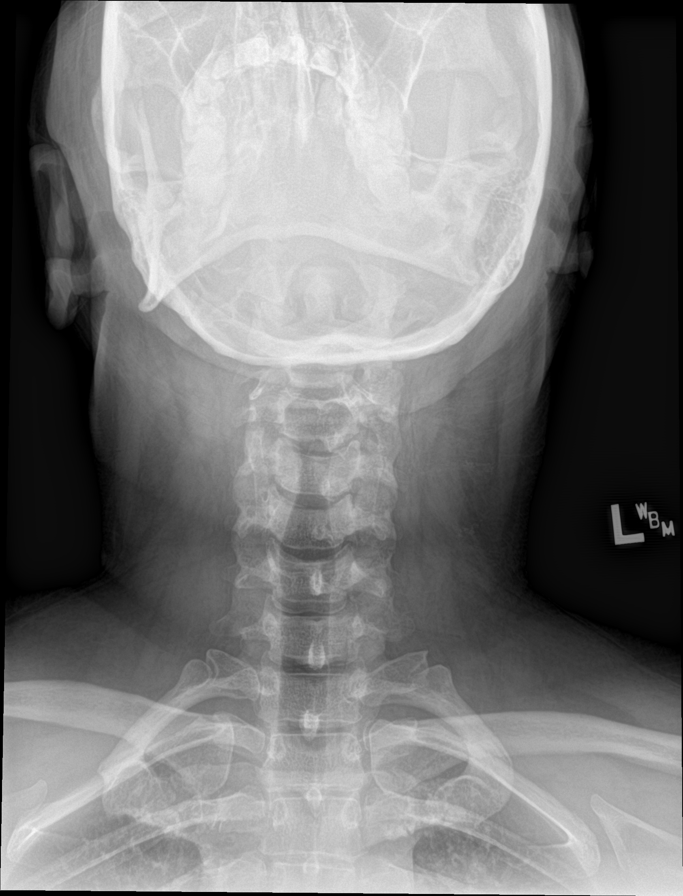

[c-spine swimmers]
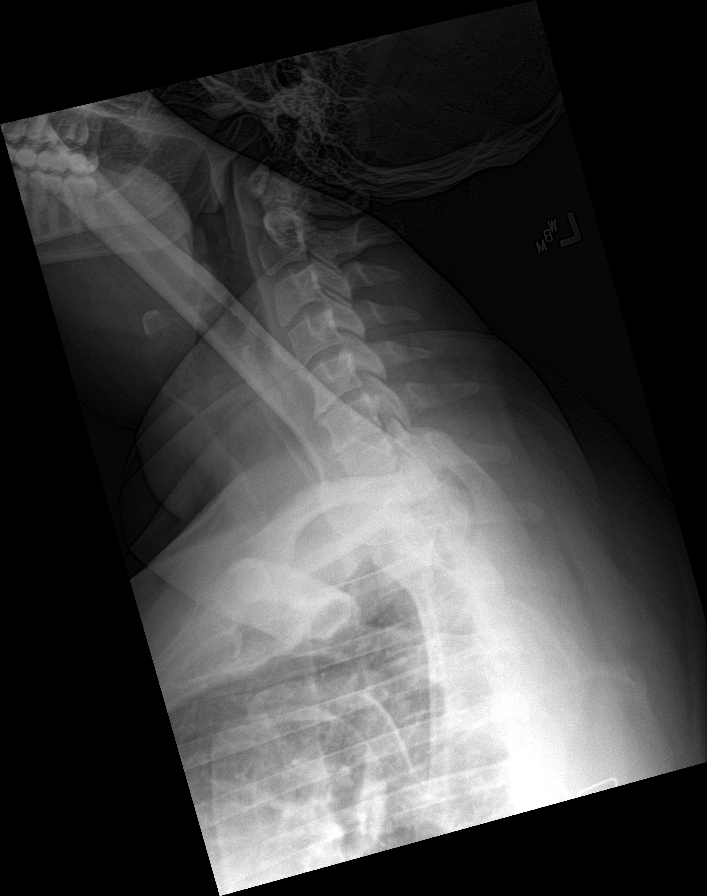

[c-spine obl (2 of 3)]
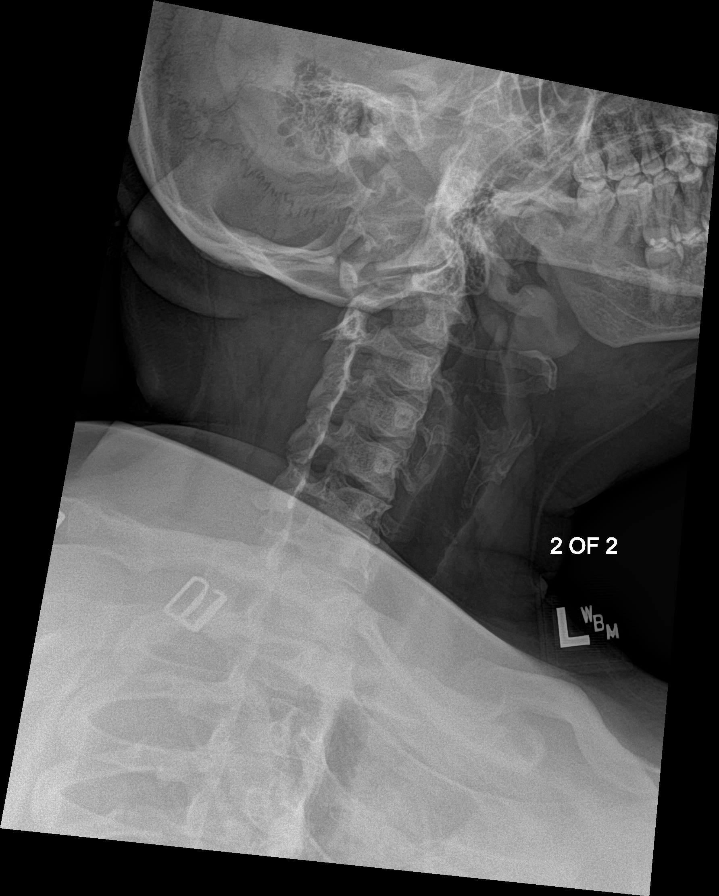

[c-spine obl (3 of 3)]
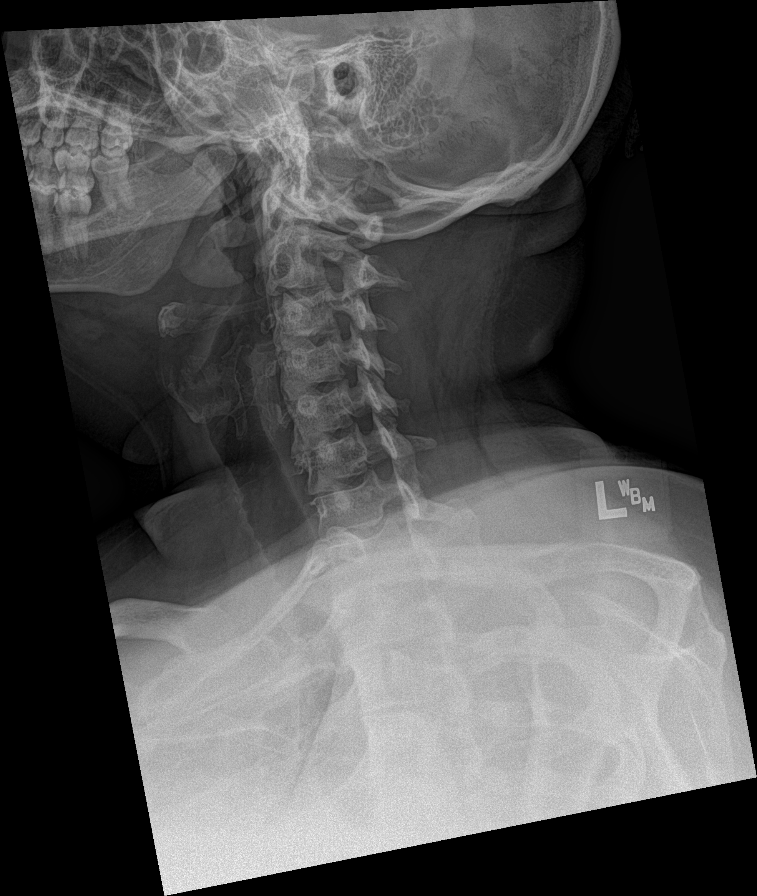

[c-spine open mouth]
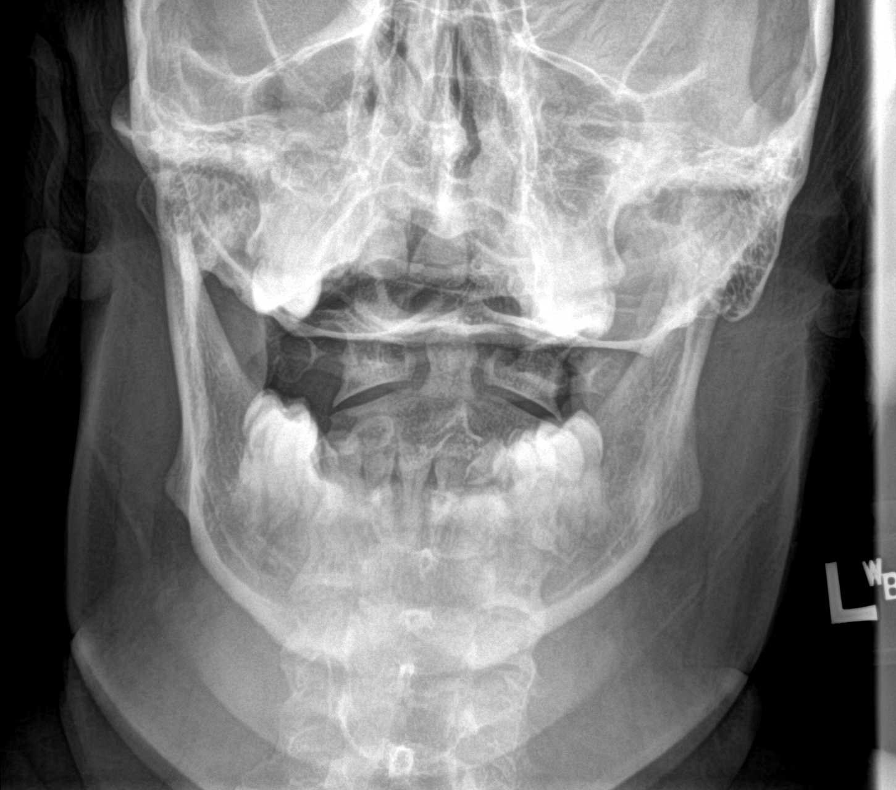

[7 of 7 positions shown; findings below may reference images not displayed]

FINDINGS: The cervical spine is well visualized to the C7 vertebral body on
the lateral view. Mild straightening of the cervical spine. The
vertebral body heights are maintained without compression fracture.
There is mild disc space disease at C5-C6 and C6-C7, with small
anterior projecting osteophytes at these levels. The soft tissues
are unremarkable.
IMPRESSION: Mild degenerative changes at C5-C6 and C6-C7.

## 2021-03-17 MED ORDER — OXYCODONE-ACETAMINOPHEN 10-325 MG PO TABS
1.0000 | ORAL_TABLET | Freq: Four times a day (QID) | ORAL | 0 refills | Status: AC | PRN
  Filled 2021-03-17: qty 20, 5d supply, fill #0

## 2021-03-17 MED ORDER — LIDOCAINE 5 % EX PTCH
2.0000 | MEDICATED_PATCH | CUTANEOUS | Status: DC
  Administered 2021-03-17: 2 via TRANSDERMAL

## 2021-03-17 MED ORDER — KETOROLAC TROMETHAMINE 60 MG/2ML IM SOLN
INTRAMUSCULAR | Status: AC
  Filled 2021-03-17: qty 2

## 2021-03-17 MED ORDER — LIDOCAINE 5 % EX PTCH
MEDICATED_PATCH | CUTANEOUS | Status: AC
  Filled 2021-03-17: qty 2

## 2021-03-17 MED ORDER — TRIAMCINOLONE ACETONIDE 40 MG/ML IJ SUSP
80.0000 mg | Freq: Once | INTRAMUSCULAR | Status: AC
  Administered 2021-03-17: 80 mg via INTRAMUSCULAR

## 2021-03-17 MED ORDER — LIDOCAINE 5 % EX PTCH
1.0000 | MEDICATED_PATCH | CUTANEOUS | 0 refills | Status: DC
  Filled 2021-03-17: qty 30, 30d supply, fill #0

## 2021-03-17 MED ORDER — KETOROLAC TROMETHAMINE 60 MG/2ML IM SOLN: 60.0000 mg | Freq: Once | INTRAMUSCULAR | Status: DC

## 2021-03-17 MED ORDER — GABAPENTIN 300 MG PO CAPS
300.0000 mg | ORAL_CAPSULE | Freq: Three times a day (TID) | ORAL | 3 refills | Status: DC
  Filled 2021-03-17: qty 90, 30d supply, fill #0

## 2021-03-17 NOTE — Progress Notes (Signed)
BP 135/79   Pulse 64   Temp 97.9 F (36.6 C)   Wt 229 lb (103.9 kg)   SpO2 98%   BMI 40.57 kg/m    Subjective:    Patient ID: Cynthia George, female    DOB: [birth year], 35 y.o.   MRN: [id number]  HPI: Cynthia George is a 35 y.o. female  Chief Complaint  Patient presents with   Arm Pain    Patient here to follow up on left arm pain, pain is worsening and feels numbness in hand,    ARM PAIN Duration: 4-5 weeks Location: left Mechanism of injury: unknown Onset: sudden Severity: severe  Quality:  sharp, shooting, numb, throbbing Frequency: constant Radiation: down her arm Aggravating factors: movement  Alleviating factors: nothing  Status: stable Treatments attempted:  steroids, torasol, muscle relaxers, meloxicam, rest, ice, heat, APAP, ibuprofen, aleve, and physical therapy  Relief with NSAIDs?:  no Swelling: no Redness: no  Warmth: no Trauma: no Chest pain: no  Shortness of breath: no  Fever: no Decreased sensation: yes Paresthesias: yes Weakness: yes  Relevant past medical, surgical, family and social history reviewed and updated as indicated. Interim medical history since our last visit reviewed. Allergies and medications reviewed and updated.  Review of Systems  Constitutional: Negative.   Respiratory: Negative.    Cardiovascular: Negative.   Gastrointestinal: Negative.   Musculoskeletal:  Positive for arthralgias, myalgias, neck pain and neck stiffness. Negative for back pain, gait problem and joint swelling.  Skin: Negative.   Neurological:  Positive for weakness, numbness and headaches. Negative for dizziness, tremors, seizures, syncope, facial asymmetry, speech difficulty and light-headedness.  Psychiatric/Behavioral: Negative.     Per HPI unless specifically indicated above     Objective:    BP 135/79   Pulse 64   Temp 97.9 F (36.6 C)   Wt 229 lb (103.9 kg)   SpO2 98%   BMI 40.57 kg/m   Wt Readings from Last 3 Encounters:  03/17/21  229 lb (103.9 kg)  03/03/21 227 lb 9.6 oz (103.2 kg)  02/06/21 232 lb (105.2 kg)    Physical Exam Vitals and nursing note reviewed.  Constitutional:      General: She is not in acute distress.    Appearance: Normal appearance. She is not ill-appearing, toxic-appearing or diaphoretic.  HENT:     Head: Normocephalic and atraumatic.     Right Ear: External ear normal.     Left Ear: External ear normal.     Nose: Nose normal.     Mouth/Throat:     Mouth: Mucous membranes are moist.     Pharynx: Oropharynx is clear.  Eyes:     General: No scleral icterus.       Right eye: No discharge.        Left eye: No discharge.     Extraocular Movements: Extraocular movements intact.     Conjunctiva/sclera: Conjunctivae normal.     Pupils: Pupils are equal, round, and reactive to light.  Cardiovascular:     Rate and Rhythm: Normal rate and regular rhythm.     Pulses: Normal pulses.     Heart sounds: Normal heart sounds. No murmur heard.   No friction rub. No gallop.  Pulmonary:     Effort: Pulmonary effort is normal. No respiratory distress.     Breath sounds: Normal breath sounds. No stridor. No wheezing, rhonchi or rales.  Chest:     Chest wall: No tenderness.  Musculoskeletal:  General: Normal range of motion.     Cervical back: Normal range of motion and neck supple.  Skin:    General: Skin is warm and dry.     Capillary Refill: Capillary refill takes less than 2 seconds.     Coloration: Skin is not jaundiced or pale.     Findings: No bruising, erythema, lesion or rash.  Neurological:     Mental Status: She is alert and oriented to person, place, and time. Mental status is at baseline.     Sensory: Sensory deficit present.     Motor: Weakness present.  Psychiatric:        Mood and Affect: Mood normal.        Behavior: Behavior normal.        Thought Content: Thought content normal.        Judgment: Judgment normal.    Results for orders placed or performed in visit on  01/01/21  Comp Met (CMET)  Result Value Ref Range   Glucose 77 65 - 99 mg/dL   BUN 11 6 - 20 mg/dL   Creatinine, Ser 0.79 0.57 - 1.00 mg/dL   eGFR 101 >59 mL/min/1.73   BUN/Creatinine Ratio 14 9 - 23   Sodium 139 134 - 144 mmol/L   Potassium 5.0 3.5 - 5.2 mmol/L   Chloride 100 96 - 106 mmol/L   CO2 25 20 - 29 mmol/L   Calcium 9.7 8.7 - 10.2 mg/dL   Total Protein 7.8 6.0 - 8.5 g/dL   Albumin 4.6 3.8 - 4.8 g/dL   Globulin, Total 3.2 1.5 - 4.5 g/dL   Albumin/Globulin Ratio 1.4 1.2 - 2.2   Bilirubin Total 0.9 0.0 - 1.2 mg/dL   Alkaline Phosphatase 60 44 - 121 IU/L   AST 27 0 - 40 IU/L   ALT 47 (H) 0 - 32 IU/L  CBC with Differential  Result Value Ref Range   WBC 7.3 3.4 - 10.8 x10E3/uL   RBC 4.97 3.77 - 5.28 x10E6/uL   Hemoglobin 13.5 11.1 - 15.9 g/dL   Hematocrit 40.6 34.0 - 46.6 %   MCV 82 79 - 97 fL   MCH 27.2 26.6 - 33.0 pg   MCHC 33.3 31.5 - 35.7 g/dL   RDW 13.2 11.7 - 15.4 %   Platelets 357 150 - 450 x10E3/uL   Neutrophils 62 Not Estab. %   Lymphs 26 Not Estab. %   Monocytes 8 Not Estab. %   Eos 3 Not Estab. %   Basos 1 Not Estab. %   Neutrophils Absolute 4.5 1.4 - 7.0 x10E3/uL   Lymphocytes Absolute 1.9 0.7 - 3.1 x10E3/uL   Monocytes Absolute 0.6 0.1 - 0.9 x10E3/uL   EOS (ABSOLUTE) 0.2 0.0 - 0.4 x10E3/uL   Basophils Absolute 0.1 0.0 - 0.2 x10E3/uL   Immature Granulocytes 0 Not Estab. %   Immature Grans (Abs) 0.0 0.0 - 0.1 x10E3/uL  TSH  Result Value Ref Range   TSH 1.240 0.450 - 4.500 uIU/mL      Assessment & Plan:   Problem List Items Addressed This Visit   None Visit Diagnoses     Radicular pain in left arm    -  Primary   In severe pain. Will give kenalog shot. Increase gabapentin to 365m TID and PRN percocet. Will check x-ray, likely needs MRI. Call with any concerns.    Relevant Medications   triamcinolone acetonide (KENALOG-40) injection 80 mg   Other Relevant Orders   DG Cervical Spine Complete  Follow up plan: Return in about 3 weeks  (around 04/07/2021).

## 2021-03-17 NOTE — ED Notes (Signed)
Patient refused ketorlac injection due to not being able to receive injection in her arm due to the amount of injection. Patient states "I will just take my toradol at home"

## 2021-03-17 NOTE — ED Provider Notes (Signed)
[facility] Provider Note   CSN: [id number] Arrival date & time: 03/16/21  1931     History Chief Complaint  Patient presents with   Arm Pain    Cynthia George is a 35 y.o. female.  The history is provided by the patient.  Arm Pain This is a chronic problem. The current episode started more than 1 week ago. The problem occurs constantly. The problem has not changed since onset.Pertinent negatives include no chest pain, no abdominal pain, no headaches and no shortness of breath. Nothing aggravates the symptoms. Nothing relieves the symptoms. Treatments tried: injections by orthopedics muscle relaxants steroids oral toradol and narcotics.  Has been seen by both orthopedics and PMD for months. The treatment provided no relief.  Chronic radicular pain of LUE, seen by both PMD and emrg ortho who was reportedly sent in to the ED for ongoing pain and further work up by PMD.  No changes.      Past Medical History:  Diagnosis Date   Anxiety    Depression    Hyperlipidemia    Migraines    Obesity    PCOS (polycystic ovarian syndrome)     Patient Active Problem List   Diagnosis Date Noted   Chest pain 01/01/2021   Infertility associated with anovulation 06/23/2019   Major depression, single episode, in complete remission ([place]) 06/22/2019   Postpartum hypertension 11/18/2016   Gestational hypertension without significant proteinuria 11/01/2016   Morbid obesity ([place]) 07/27/2016   Hypothyroid 06/30/2016   Elevated LFTs 05/27/2016   HLD (hyperlipidemia)    Migraines    PCOS (polycystic ovarian syndrome)     Past Surgical History:  Procedure Laterality Date   APPENDECTOMY  2013   HUMERUS FRACTURE SURGERY Left 2002,2003   rod and screws placed and removed   TONSILLECTOMY AND ADENOIDECTOMY  2004   WISDOM TOOTH EXTRACTION  2011   All     OB History     Gravida  1   Para  1   Term  1   Preterm      AB      Living  1      SAB       IAB      Ectopic      Multiple  0   Live Births  1           Family History  Problem Relation Age of Onset   Heart disease Mother    Hypertension Mother    Hyperlipidemia Mother    Heart attack Mother    Hyperlipidemia Father    Hypertension Father    Diabetes Father    Asthma Father    Heart attack Father 40   Hyperlipidemia Sister    Asthma Sister    Polycystic ovary syndrome Sister    Mental illness Sister        Depression   Mental illness Brother        Depression   Diabetes Maternal Grandmother    Hyperlipidemia Maternal Grandmother    Hypertension Maternal Grandmother    Diabetes Maternal Grandfather    Hypertension Maternal Grandfather    Heart attack Maternal Grandfather    Diabetes Paternal Grandmother    Heart attack Paternal Grandfather     Social History   Tobacco Use   Smoking status: Never   Smokeless tobacco: Never  Vaping Use   Vaping Use: Never used  Substance Use Topics   Alcohol use: No   Drug use:  No    Home Medications Prior to Admission medications   Medication Sig Start Date End Date Taking? Authorizing Provider  lidocaine (LIDODERM) 5 % Place 1 patch onto the skin daily. Remove & Discard patch within 12 hours or as directed by MD 03/17/21  Yes Blanca Carreon, MD  baclofen (LIORESAL) 10 MG tablet Take 1 tablet (10 mg total) by mouth at bedtime as needed for muscle spasms. 03/03/21   Johnson, Megan P, DO  cyclobenzaprine (FLEXERIL) 5 MG tablet Take 1 tablet twice a day by oral route as needed for 15 days. 03/13/21     gabapentin (NEURONTIN) 100 MG capsule Take 1 capsule (100 mg total) by mouth 3 (three) times daily. 03/04/21   Johnson, Megan P, DO  ketorolac (TORADOL) 10 MG tablet Take 1 tablet every 6 hours by oral route with meals for 3 days. 03/13/21     levothyroxine (SYNTHROID) 100 MCG tablet TAKE 1 TABLET BY MOUTH DAILY BEFORE BREAKFAST. 07/25/20 07/25/21  Johnson, Megan P, DO  medroxyPROGESTERone (PROVERA) 10 MG tablet Take 1  tablet (10 mg total) by mouth daily. Use for ten days 02/06/21   Vena Austria, MD  meloxicam (MOBIC) 15 MG tablet Take 1 tablet every day by oral route for 30 days. 03/13/21     methylPREDNISolone (MEDROL) 4 MG TBPK tablet Take as instructed for 6 days Patient not taking: Reported on 03/03/2021 03/01/21     omeprazole (PRILOSEC) 20 MG capsule Take 1 capsule (20 mg total) by mouth daily. Patient not taking: Reported on 03/03/2021 01/01/21   Gerre Scull, NP  phentermine (ADIPEX-P) 37.5 MG tablet Take 1 tablet (37.5 mg total) by mouth daily before breakfast. Patient not taking: Reported on 03/03/2021 02/06/21   Vena Austria, MD  predniSONE (DELTASONE) 50 MG tablet Take 1 tablet (50 mg total) by mouth daily with breakfast. 03/04/21   Olevia Perches P, DO    Allergies    Flagyl [metronidazole], Keflex [cephalexin], and Septra [sulfamethoxazole-trimethoprim]  Review of Systems   Review of Systems  Constitutional:  Negative for fever.  HENT:  Negative for congestion.   Eyes:  Negative for redness.  Respiratory:  Negative for shortness of breath.   Cardiovascular:  Negative for chest pain.  Gastrointestinal:  Negative for abdominal pain.  Genitourinary:  Negative for difficulty urinating.  Musculoskeletal:  Positive for arthralgias. Negative for neck pain and neck stiffness.  Skin:  Negative for rash.  Neurological:  Negative for speech difficulty and headaches.  Psychiatric/Behavioral:  Negative for agitation.   All other systems reviewed and are negative.  Physical Exam Updated Vital Signs BP 135/65 (BP Location: Right Arm)   Pulse 64   Temp 98 F (36.7 C) (Oral)   Resp 18   SpO2 99%   Physical Exam Vitals and nursing note reviewed.  Constitutional:      Appearance: Normal appearance.  HENT:     Head: Normocephalic and atraumatic.     Nose: Nose normal.  Eyes:     Conjunctiva/sclera: Conjunctivae normal.     Pupils: Pupils are equal, round, and reactive to  light.  Neck:     Vascular: No carotid bruit.  Cardiovascular:     Rate and Rhythm: Normal rate and regular rhythm.     Pulses: Normal pulses.  Abdominal:     General: Abdomen is flat. Bowel sounds are normal.     Palpations: Abdomen is soft.     Tenderness: There is no abdominal tenderness. There is no guarding.  Musculoskeletal:  General: No swelling, tenderness, deformity or signs of injury. Normal range of motion.     Left shoulder: Normal.     Cervical back: Normal range of motion and neck supple. No rigidity or tenderness.     Left lower leg: No edema.     Comments: Negative Neers test LUE, 5/5 strength   Lymphadenopathy:     Cervical: No cervical adenopathy.  Skin:    General: Skin is warm and dry.     Capillary Refill: Capillary refill takes less than 2 seconds.  Neurological:     General: No focal deficit present.     Mental Status: She is alert and oriented to person, place, and time.  Psychiatric:        Mood and Affect: Mood normal.        Behavior: Behavior normal.    ED Results / Procedures / Treatments   Labs (all labs ordered are listed, but only abnormal results are displayed) Labs Reviewed - No data to display  EKG None  Radiology No results found.  Procedures Procedures   Medications Ordered in ED Medications  lidocaine (LIDODERM) 5 % 2 patch (has no administration in time range)  ketorolac (TORADOL) injection 60 mg (has no administration in time range)    ED Course  I have reviewed the triage vital signs and the nursing notes.  Pertinent labs & imaging results that were available during my care of the patient were reviewed by me and considered in my medical decision making (see chart for details).    Symptoms are chronic and radicular, patient will likely need outpatient MRI of the neck but this is not emergent and should not have been sent to the ED.  Follow up with your PMD and orthopedics specialist.  Will add lidoderm.   Cynthia George was evaluated in Emergency Department on 03/17/2021 for the symptoms described in the history of present illness. She was evaluated in the context of the global COVID-19 pandemic, which necessitated consideration that the patient might be at risk for infection with the SARS-CoV-2 virus that causes COVID-19. Institutional protocols and algorithms that pertain to the evaluation of patients at risk for COVID-19 are in a state of rapid change based on information released by regulatory bodies including the CDC and federal and state organizations. These policies and algorithms were followed during the patient's care in the ED.  Final Clinical Impression(s) / ED Diagnoses Final diagnoses:  Radicular pain in left arm   Return for intractable cough, coughing up blood, fevers > 100.4 unrelieved by medication, shortness of breath, intractable vomiting, chest pain, shortness of breath, weakness, numbness, changes in speech, facial asymmetry, abdominal pain, passing out, Inability to tolerate liquids or food, cough, altered mental status or any concerns. No signs of systemic illness or infection. The patient is nontoxic-appearing on exam and vital signs are within normal limits.  I have reviewed the triage vital signs and the nursing notes. Pertinent labs & imaging results that were available during my care of the patient were reviewed by me and considered in my medical decision making (see chart for details). After history, exam, and medical workup I feel the patient has been appropriately medically screened and is safe for discharge home. Pertinent diagnoses were discussed with the patient. Patient was given return precautions.  Rx / DC Orders ED Discharge Orders          Ordered    lidocaine (LIDODERM) 5 %  Every 24 hours  03/17/21 [place], Ereka Brau, MD 03/17/21 [id number]

## 2021-03-19 ENCOUNTER — Other Ambulatory Visit: Payer: Self-pay | Admitting: Family Medicine

## 2021-03-19 DIAGNOSIS — M5030 Other cervical disc degeneration, unspecified cervical region: Secondary | ICD-10-CM

## 2021-03-30 ENCOUNTER — Other Ambulatory Visit: Payer: Self-pay

## 2021-03-30 ENCOUNTER — Ambulatory Visit
Admission: RE | Admit: 2021-03-30 | Discharge: 2021-03-30 | Disposition: A | Discharge status: Home / Self Care | Payer: No Typology Code available for payment source | Source: Ambulatory Visit | Attending: Family Medicine | Admitting: Family Medicine

## 2021-03-30 DIAGNOSIS — M5030 Other cervical disc degeneration, unspecified cervical region: Secondary | ICD-10-CM | POA: Insufficient documentation

## 2021-03-30 IMAGING — MR MR CERVICAL SPINE W/O CM
5 series · 34 of 48 positions shown · non-contrast
Comparison: Cervical spine radiographs [DATE].
COMPARISON: Cervical spine radiographs [DATE].

Addendum:
CLINICAL DATA: 35-year-old female with neck pain radiating down the
left arm for 6 weeks. No known injury.

EXAM:
MRI CERVICAL SPINE WITHOUT CONTRAST
TECHNIQUE: Multiplanar, multisequence MR imaging of the cervical spine was
performed. No intravenous contrast was administered.

[Series 5: T2 · sagittal · 3.0mm · 0.62mm/px · 6 of 15 slices shown (1 of 2)]
[im 1/15]
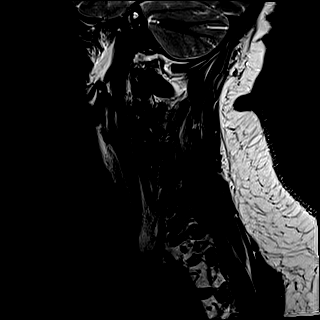
[im 3/15]
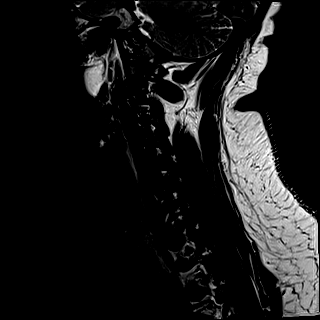
[im 6/15]
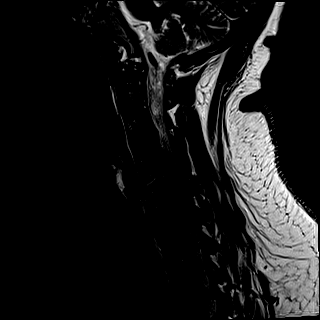
[im 9/15]
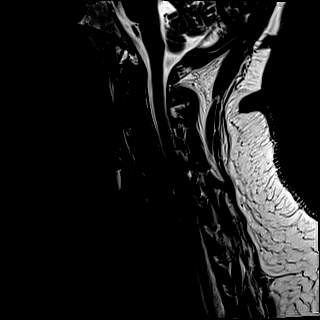
[im 12/15]
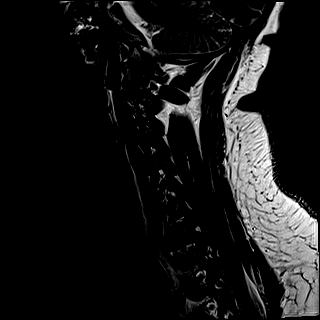
[im 15/15]
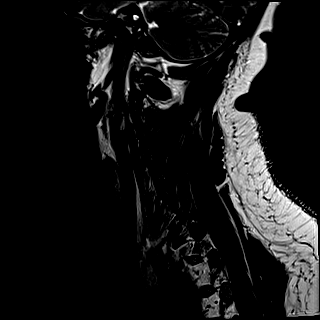

[Series 6: FLAIR · sagittal · 3.0mm · 0.78mm/px · 7 of 15 slices shown]
[im 1/15]
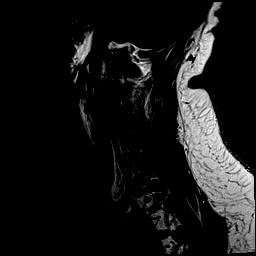
[im 3/15]
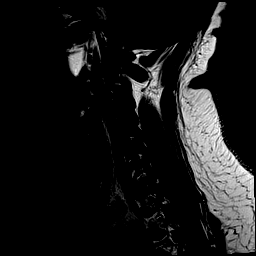
[im 5/15]
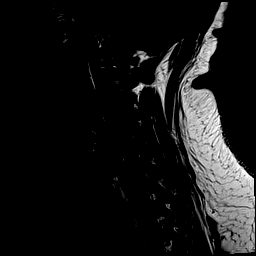
[im 8/15]
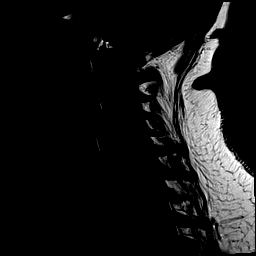
[im 10/15]
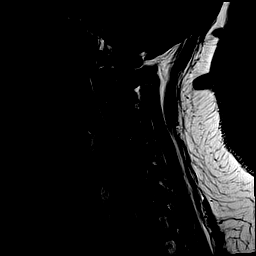
[im 12/15]
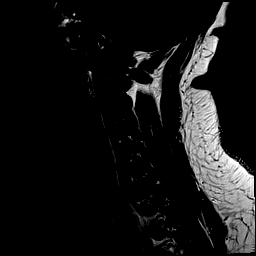
[im 15/15]
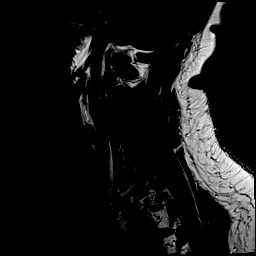

[Series 7: STIR · sagittal · 3.0mm · 0.62mm/px · 7 of 15 slices shown]
[im 1/15]
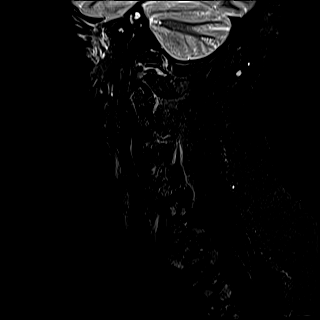
[im 3/15]
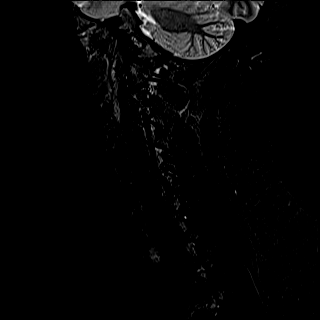
[im 5/15]
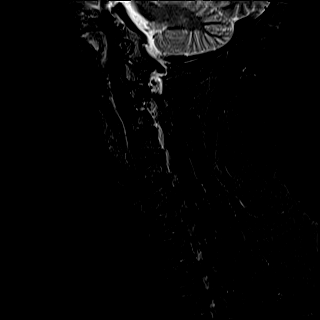
[im 8/15]
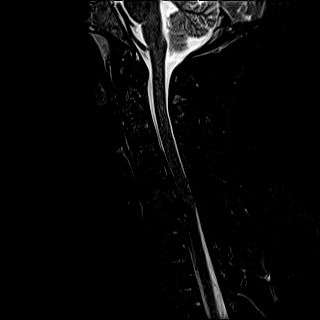
[im 10/15]
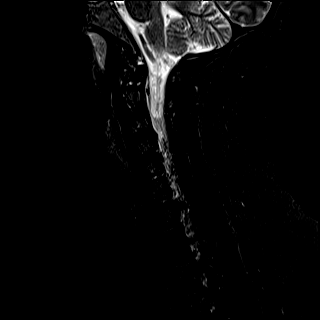
[im 12/15]
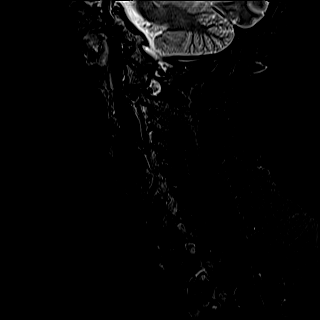
[im 15/15]
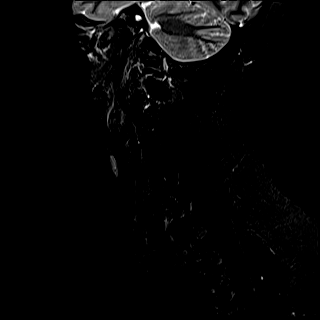

[Series 8: T2 · axial · 3.0mm · 0.70mm/px · z∈[-148,-53]mm · 8 of 29 slices shown (2 of 2)]
[im 1/29]
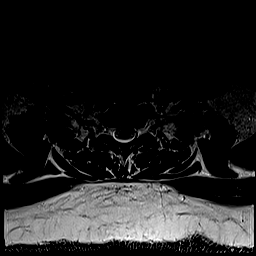
[im 5/29]
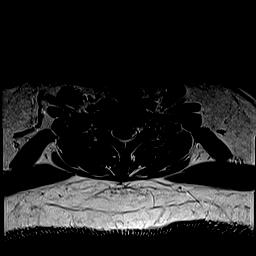
[im 9/29]
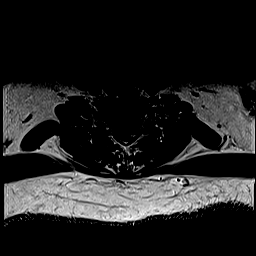
[im 13/29]
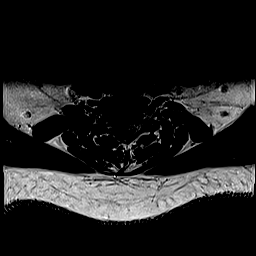
[im 16/29]
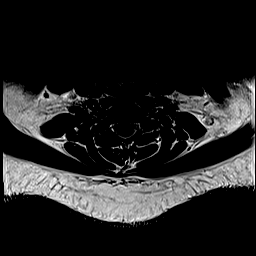
[im 20/29]
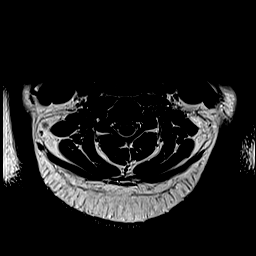
[im 24/29]
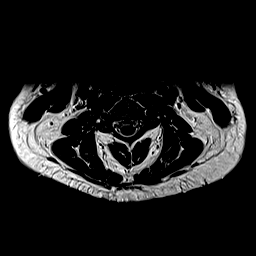
[im 29/29]
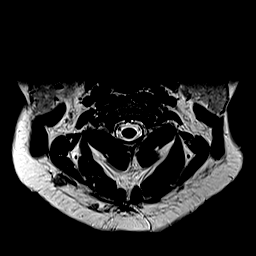

[Series 9: ax mpgr · axial · 3.0mm · 0.35mm/px · z∈[-148,-83]mm · 6 of 29 slices shown]
[im 1/29]
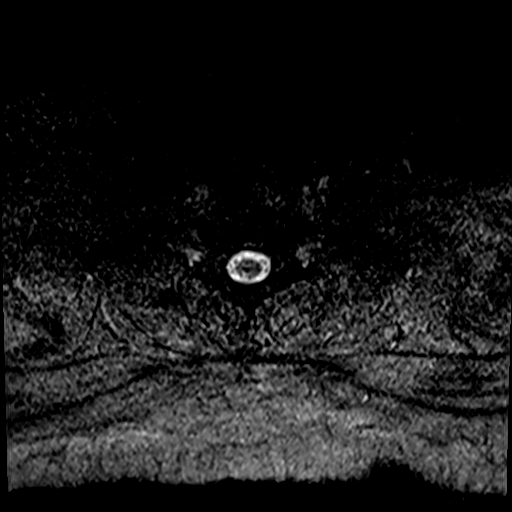
[im 5/29]
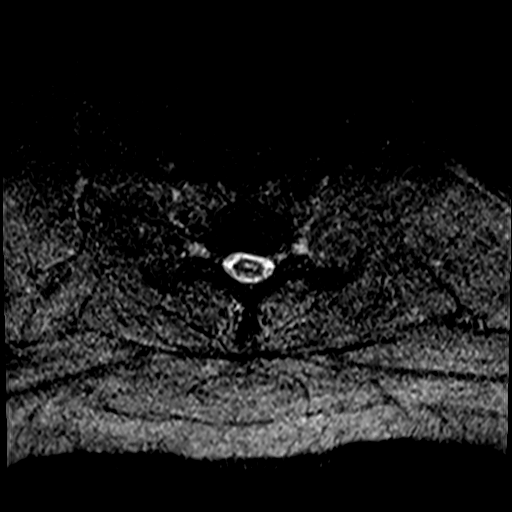
[im 9/29]
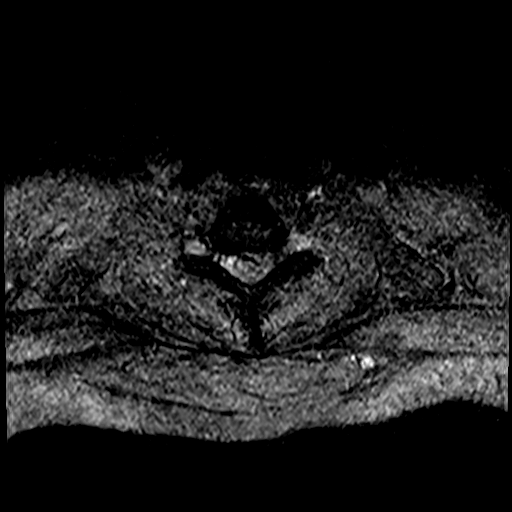
[im 13/29]
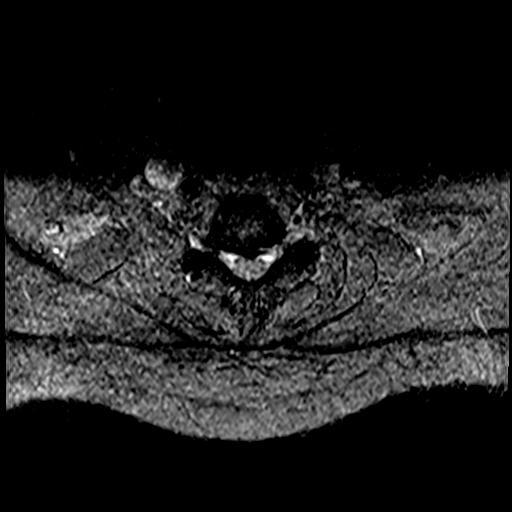
[im 16/29]
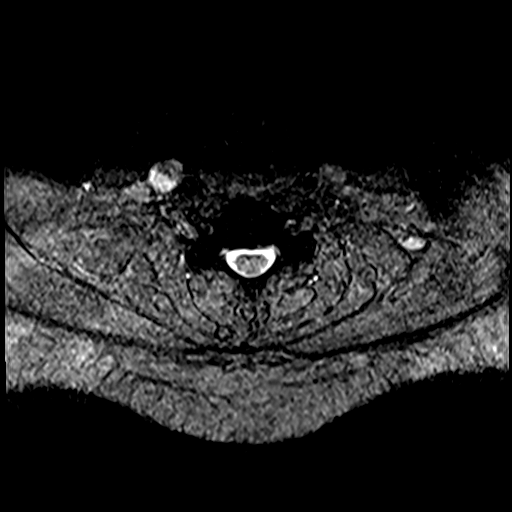
[im 20/29]
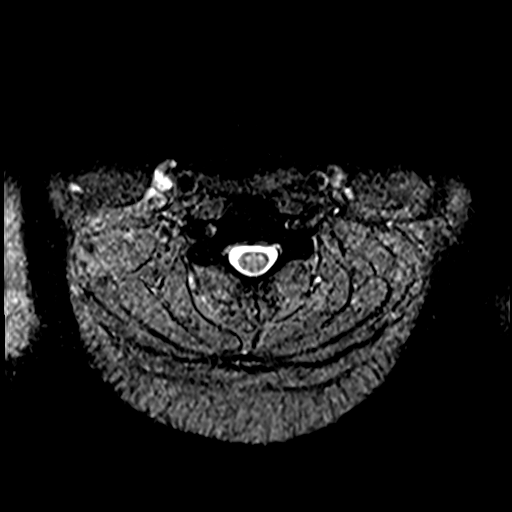

[34 of 48 positions shown; findings below may reference images not displayed]

FINDINGS: Alignment: Stable straightening of cervical lordosis since last
month. No spondylolisthesis.

Vertebrae: No marrow edema or evidence of acute osseous abnormality.
Visualized bone marrow signal is within normal limits.

Cord: Moderate mass effect on the left hemicord related to C6-C7
disc herniation, see below. But no convincing spinal cord signal
abnormality at that or other levels.

Posterior Fossa, vertebral arteries, paraspinal tissues:
Cervicomedullary junction is within normal limits. Negative visible
posterior fossa. Preserved major vascular flow voids in the neck.
Negative visible neck soft tissues, right lung apex.

Disc levels:

C2-C3:  Negative.

C3-C4: Mild foraminal disc bulging and endplate spurring. No spinal
stenosis. Mild bilateral C4 foraminal stenosis.

C4-C5: Subtle disc space loss. Foraminal disc bulging and endplate
spurring primarily on the right. Mild to moderate right C5 foraminal
stenosis.

C5-C6: Disc space loss with mild circumferential disc bulging and
endplate spurring and superimposed central to right paracentral disc
extrusion with some cephalad and caudal migration of disc material.
See series 8, image 17 and series 5, image 18. Mild spinal stenosis
and spinal cord mass effect. But the C5 neural foramina are
relatively spared.

C6-C7: Disc space loss. Bulky leftward disc extrusion (series 7,
image 9, series 8, image 21 and series 9, image 22) severely
affecting the left spinal canal and proximal left C7 neural foramen.
Mild to moderate spinal stenosis and left hemi cord mass effect.
Severe proximal left foraminal stenosis. Additional disc bulging and
endplate spurring only mildly affecting the right C7 foramen.

C7-T1: Minimal if any disc bulging and endplate spurring here. No
stenosis.
IMPRESSION: 1. Bulky leftward disc extrusion at C6-C7. Mild to moderate spinal
stenosis with left hemi-cord mass effect, and severe involvement of
the proximal left neural foramen. Query Left C7 radiculitis. No
spinal cord signal abnormality. Recommend Spine Surgery
consultation.

2. C5-C6 disc and endplate degeneration with mild spinal stenosis.
And mild C4, mild to moderate right C5 neural foraminal stenosis
related to foraminal disc bulging and endplate spurring.

ADDENDUM:
Study discussed by telephone with Dr. JUZRYLL on [DATE] at
[QU] hours.

*** End of Addendum ***
FINDINGS: Alignment: Stable straightening of cervical lordosis since last
month. No spondylolisthesis.

Vertebrae: No marrow edema or evidence of acute osseous abnormality.
Visualized bone marrow signal is within normal limits.

Cord: Moderate mass effect on the left hemicord related to C6-C7
disc herniation, see below. But no convincing spinal cord signal
abnormality at that or other levels.

Posterior Fossa, vertebral arteries, paraspinal tissues:
Cervicomedullary junction is within normal limits. Negative visible
posterior fossa. Preserved major vascular flow voids in the neck.
Negative visible neck soft tissues, right lung apex.

Disc levels:

C2-C3:  Negative.

C3-C4: Mild foraminal disc bulging and endplate spurring. No spinal
stenosis. Mild bilateral C4 foraminal stenosis.

C4-C5: Subtle disc space loss. Foraminal disc bulging and endplate
spurring primarily on the right. Mild to moderate right C5 foraminal
stenosis.

C5-C6: Disc space loss with mild circumferential disc bulging and
endplate spurring and superimposed central to right paracentral disc
extrusion with some cephalad and caudal migration of disc material.
See series 8, image 17 and series 5, image 18. Mild spinal stenosis
and spinal cord mass effect. But the C5 neural foramina are
relatively spared.

C6-C7: Disc space loss. Bulky leftward disc extrusion (series 7,
image 9, series 8, image 21 and series 9, image 22) severely
affecting the left spinal canal and proximal left C7 neural foramen.
Mild to moderate spinal stenosis and left hemi cord mass effect.
Severe proximal left foraminal stenosis. Additional disc bulging and
endplate spurring only mildly affecting the right C7 foramen.

C7-T1: Minimal if any disc bulging and endplate spurring here. No
stenosis.
IMPRESSION: 1. Bulky leftward disc extrusion at C6-C7. Mild to moderate spinal
stenosis with left hemi-cord mass effect, and severe involvement of
the proximal left neural foramen. Query Left C7 radiculitis. No
spinal cord signal abnormality. Recommend Spine Surgery
consultation.

2. C5-C6 disc and endplate degeneration with mild spinal stenosis.
And mild C4, mild to moderate right C5 neural foraminal stenosis
related to foraminal disc bulging and endplate spurring.

## 2021-03-31 ENCOUNTER — Other Ambulatory Visit: Payer: Self-pay | Admitting: Family Medicine

## 2021-03-31 DIAGNOSIS — M5020 Other cervical disc displacement, unspecified cervical region: Secondary | ICD-10-CM

## 2021-04-07 ENCOUNTER — Encounter: Payer: Self-pay | Admitting: Family Medicine

## 2021-04-07 ENCOUNTER — Ambulatory Visit (INDEPENDENT_AMBULATORY_CARE_PROVIDER_SITE_OTHER): Payer: No Typology Code available for payment source | Admitting: Family Medicine

## 2021-04-07 ENCOUNTER — Other Ambulatory Visit: Payer: Self-pay

## 2021-04-07 VITALS — BP 125/72 | HR 88 | Temp 98.5°F | Ht 62.99 in | Wt 225.0 lb

## 2021-04-07 DIAGNOSIS — M5020 Other cervical disc displacement, unspecified cervical region: Secondary | ICD-10-CM | POA: Diagnosis not present

## 2021-04-07 NOTE — Progress Notes (Signed)
BP 125/72    Pulse 88    Temp 98.5 F (36.9 C) (Oral)    Ht 5' 2.99" (1.6 m)    Wt 225 lb (102.1 kg)    LMP 03/09/2021 (Approximate)    SpO2 98%    BMI 39.87 kg/m    Subjective:    Patient ID: Cynthia George, female    DOB: 12-26-1985, 35 y.o.   MRN: [id number]  HPI: Cynthia George is a 35 y.o. female  Chief Complaint  Patient presents with   Neck Pain   NECK PAIN FOLLOW UP- has been seeing chiropractor. Has been using her pain patches, but not taking her medicine Diagnosis: cervical DJD Status: better Treatments attempted:  chiropractor, rest, ice, heat, APAP, ibuprofen, aleve, muscle relaxer, physical therapy, and HEP  Compliant with recommended treatment: yes Relief with NSAIDs?:  mild Location:Right arm and  Duration:weeks Severity: mild Quality: shooting, numb, tingling Frequency: intermittent Radiation: R arm Aggravating factors: movement Alleviating factors: nothing Weakness:  yes Paresthesias / decreased sensation:  yes  Fevers:  no  Relevant past medical, surgical, family and social history reviewed and updated as indicated. Interim medical history since our last visit reviewed. Allergies and medications reviewed and updated.  Review of Systems  Constitutional: Negative.   Respiratory: Negative.    Cardiovascular: Negative.   Gastrointestinal: Negative.   Musculoskeletal:  Positive for myalgias, neck pain and neck stiffness. Negative for arthralgias, back pain, gait problem and joint swelling.  Skin: Negative.   Neurological:  Positive for weakness and numbness. Negative for dizziness, tremors, seizures, syncope, facial asymmetry, speech difficulty, light-headedness and headaches.  Psychiatric/Behavioral: Negative.     Per HPI unless specifically indicated above     Objective:    BP 125/72    Pulse 88    Temp 98.5 F (36.9 C) (Oral)    Ht 5' 2.99" (1.6 m)    Wt 225 lb (102.1 kg)    LMP 03/09/2021 (Approximate)    SpO2 98%    BMI 39.87 kg/m   Wt  Readings from Last 3 Encounters:  04/07/21 225 lb (102.1 kg)  03/17/21 229 lb (103.9 kg)  03/03/21 227 lb 9.6 oz (103.2 kg)    Physical Exam Vitals and nursing note reviewed.  Constitutional:      General: She is not in acute distress.    Appearance: Normal appearance. She is not ill-appearing, toxic-appearing or diaphoretic.  HENT:     Head: Normocephalic and atraumatic.     Right Ear: External ear normal.     Left Ear: External ear normal.     Nose: Nose normal.     Mouth/Throat:     Mouth: Mucous membranes are moist.     Pharynx: Oropharynx is clear.  Eyes:     General: No scleral icterus.       Right eye: No discharge.        Left eye: No discharge.     Extraocular Movements: Extraocular movements intact.     Conjunctiva/sclera: Conjunctivae normal.     Pupils: Pupils are equal, round, and reactive to light.  Cardiovascular:     Rate and Rhythm: Normal rate and regular rhythm.     Pulses: Normal pulses.     Heart sounds: Normal heart sounds. No murmur heard.   No friction rub. No gallop.  Pulmonary:     Effort: Pulmonary effort is normal. No respiratory distress.     Breath sounds: Normal breath sounds. No stridor. No wheezing, rhonchi or  rales.  Chest:     Chest wall: No tenderness.  Musculoskeletal:        General: Normal range of motion.     Cervical back: Normal range of motion and neck supple.  Skin:    General: Skin is warm and dry.     Capillary Refill: Capillary refill takes less than 2 seconds.     Coloration: Skin is not jaundiced or pale.     Findings: No bruising, erythema, lesion or rash.  Neurological:     General: No focal deficit present.     Mental Status: She is alert and oriented to person, place, and time. Mental status is at baseline.  Psychiatric:        Mood and Affect: Mood normal.        Behavior: Behavior normal.        Thought Content: Thought content normal.        Judgment: Judgment normal.    Results for orders placed or performed  in visit on 01/01/21  Comp Met (CMET)  Result Value Ref Range   Glucose 77 65 - 99 mg/dL   BUN 11 6 - 20 mg/dL   Creatinine, Ser 0.79 0.57 - 1.00 mg/dL   eGFR 101 >59 mL/min/1.73   BUN/Creatinine Ratio 14 9 - 23   Sodium 139 134 - 144 mmol/L   Potassium 5.0 3.5 - 5.2 mmol/L   Chloride 100 96 - 106 mmol/L   CO2 25 20 - 29 mmol/L   Calcium 9.7 8.7 - 10.2 mg/dL   Total Protein 7.8 6.0 - 8.5 g/dL   Albumin 4.6 3.8 - 4.8 g/dL   Globulin, Total 3.2 1.5 - 4.5 g/dL   Albumin/Globulin Ratio 1.4 1.2 - 2.2   Bilirubin Total 0.9 0.0 - 1.2 mg/dL   Alkaline Phosphatase 60 44 - 121 IU/L   AST 27 0 - 40 IU/L   ALT 47 (H) 0 - 32 IU/L  CBC with Differential  Result Value Ref Range   WBC 7.3 3.4 - 10.8 x10E3/uL   RBC 4.97 3.77 - 5.28 x10E6/uL   Hemoglobin 13.5 11.1 - 15.9 g/dL   Hematocrit 40.6 34.0 - 46.6 %   MCV 82 79 - 97 fL   MCH 27.2 26.6 - 33.0 pg   MCHC 33.3 31.5 - 35.7 g/dL   RDW 13.2 11.7 - 15.4 %   Platelets 357 150 - 450 x10E3/uL   Neutrophils 62 Not Estab. %   Lymphs 26 Not Estab. %   Monocytes 8 Not Estab. %   Eos 3 Not Estab. %   Basos 1 Not Estab. %   Neutrophils Absolute 4.5 1.4 - 7.0 x10E3/uL   Lymphocytes Absolute 1.9 0.7 - 3.1 x10E3/uL   Monocytes Absolute 0.6 0.1 - 0.9 x10E3/uL   EOS (ABSOLUTE) 0.2 0.0 - 0.4 x10E3/uL   Basophils Absolute 0.1 0.0 - 0.2 x10E3/uL   Immature Granulocytes 0 Not Estab. %   Immature Grans (Abs) 0.0 0.0 - 0.1 x10E3/uL  TSH  Result Value Ref Range   TSH 1.240 0.450 - 4.500 uIU/mL      Assessment & Plan:   Problem List Items Addressed This Visit       Musculoskeletal and Integument   Cervical disc herniation - Primary    Seeing neurosurgery tomorrow. Feeling much more comfortable. Continue to monitor. Call with any concerns.         Follow up plan: Return if symptoms worsen or fail to improve.

## 2021-04-07 NOTE — Assessment & Plan Note (Signed)
Seeing neurosurgery tomorrow. Feeling much more comfortable. Continue to monitor. Call with any concerns.

## 2021-04-10 ENCOUNTER — Encounter: Payer: Self-pay | Admitting: Obstetrics & Gynecology

## 2021-04-10 ENCOUNTER — Other Ambulatory Visit: Payer: Self-pay

## 2021-04-10 ENCOUNTER — Ambulatory Visit (INDEPENDENT_AMBULATORY_CARE_PROVIDER_SITE_OTHER): Payer: No Typology Code available for payment source | Admitting: Obstetrics & Gynecology

## 2021-04-10 VITALS — BP 126/84 | Ht 63.0 in | Wt 225.0 lb

## 2021-04-10 DIAGNOSIS — N926 Irregular menstruation, unspecified: Secondary | ICD-10-CM | POA: Diagnosis not present

## 2021-04-10 NOTE — Progress Notes (Signed)
HPI:      Cynthia George is a 35 y.o. G1P1001 who LMP was Patient's last menstrual period was 03/10/2021 (exact date)., presents today for a problem visit.  She complains of  irreg bleeding- stopped periods on Phentermine while trying to lose weight to improve PCOS and fertility, then took Provera to induce a period, subsequesntly had period for 7 days, off 9 days, then 20 days, now off 14 days.    Has hypothy as well.  Prior preg on Clomid.  Last year had difficult time w Clomid, Letrozole, and REI appt w injections (no success).  OCP SE migraines.  PMHx: She  has a past medical history of Anxiety, Depression, Hyperlipidemia, Migraines, Obesity, and PCOS (polycystic ovarian syndrome). Also,  has a past surgical history that includes Appendectomy (2013); Tonsillectomy and adenoidectomy (2004); Humerus fracture surgery (Left, 2002,2003); and Wisdom tooth extraction (2011)., family history includes Asthma in her father and sister; Diabetes in her father, maternal grandfather, maternal grandmother, and paternal grandmother; Heart attack in her maternal grandfather, mother, and paternal grandfather; Heart attack (age of onset: 36) in her father; Heart disease in her mother; Hyperlipidemia in her father, maternal grandmother, mother, and sister; Hypertension in her father, maternal grandfather, maternal grandmother, and mother; Mental illness in her brother and sister; Polycystic ovary syndrome in her sister.,  reports that she has never smoked. She has never used smokeless tobacco. She reports that she does not drink alcohol and does not use drugs.  She  Current Outpatient Medications:    baclofen (LIORESAL) 10 MG tablet, Take 1 tablet (10 mg total) by mouth at bedtime as needed for muscle spasms., Disp: 30 each, Rfl: 0   cyclobenzaprine (FLEXERIL) 5 MG tablet, Take 1 tablet twice a day by oral route as needed for 15 days., Disp: 30 tablet, Rfl: 0   gabapentin (NEURONTIN) 300 MG capsule, Take 1 capsule  (300 mg total) by mouth 3 (three) times daily., Disp: 90 capsule, Rfl: 3   levothyroxine (SYNTHROID) 100 MCG tablet, TAKE 1 TABLET BY MOUTH DAILY BEFORE BREAKFAST., Disp: 90 tablet, Rfl: 3   lidocaine (LIDODERM) 5 %, Place 1 patch onto the skin daily. Remove & Discard patch within 12 hours or as directed by MD, Disp: 30 patch, Rfl: 0   medroxyPROGESTERone (PROVERA) 10 MG tablet, Take 1 tablet (10 mg total) by mouth daily. Use for ten days, Disp: 10 tablet, Rfl: 2   meloxicam (MOBIC) 15 MG tablet, Take 1 tablet every day by oral route for 30 days., Disp: 30 tablet, Rfl: 0   omeprazole (PRILOSEC) 20 MG capsule, Take 1 capsule (20 mg total) by mouth daily., Disp: 30 capsule, Rfl: 1  Also, is allergic to flagyl [metronidazole], keflex [cephalexin], and septra [sulfamethoxazole-trimethoprim].  Review of Systems  Constitutional:  Negative for chills, fever and malaise/fatigue.  HENT:  Negative for congestion, sinus pain and sore throat.   Eyes:  Negative for blurred vision and pain.  Respiratory:  Negative for cough and wheezing.   Cardiovascular:  Negative for chest pain and leg swelling.  Gastrointestinal:  Negative for abdominal pain, constipation, diarrhea, heartburn, nausea and vomiting.  Genitourinary:  Negative for dysuria, frequency, hematuria and urgency.  Musculoskeletal:  Negative for back pain, joint pain, myalgias and neck pain.  Skin:  Negative for itching and rash.  Neurological:  Negative for dizziness, tremors and weakness.  Endo/Heme/Allergies:  Does not bruise/bleed easily.  Psychiatric/Behavioral:  Negative for depression. The patient is not nervous/anxious and does not have insomnia.  Objective: BP 126/84    Ht 5\' 3"  (1.6 m)    Wt 225 lb (102.1 kg)    LMP 03/10/2021 (Exact Date)    BMI 39.86 kg/m  Physical Exam Constitutional:      General: She is not in acute distress.    Appearance: She is well-developed.  Musculoskeletal:        General: Normal range of motion.   Neurological:     Mental Status: She is alert and oriented to person, place, and time.  Skin:    General: Skin is warm and dry.  Vitals reviewed.    ASSESSMENT/PLAN:  dysfunctional uterine bleeding  Problem List Items Addressed This Visit     Irregular bleeding    -  Primary  Monitor for now a sis not bleeding No estrogen due to migraine hx Consider referral back to REI for fertility Tx irreg bleeding if has prolonged bleed again w Provera      Barnett Applebaum, MD, [facility], [facility] 04/10/2021  4:23 PM

## 2021-05-19 ENCOUNTER — Other Ambulatory Visit: Payer: Self-pay

## 2021-05-19 ENCOUNTER — Encounter: Payer: Self-pay | Admitting: Physical Therapy

## 2021-05-19 ENCOUNTER — Ambulatory Visit: Payer: No Typology Code available for payment source | Admitting: Physical Therapy

## 2021-05-19 ENCOUNTER — Ambulatory Visit: Payer: No Typology Code available for payment source | Attending: Neurosurgery | Admitting: Physical Therapy

## 2021-05-19 DIAGNOSIS — M5412 Radiculopathy, cervical region: Secondary | ICD-10-CM | POA: Insufficient documentation

## 2021-05-19 DIAGNOSIS — M6281 Muscle weakness (generalized): Secondary | ICD-10-CM | POA: Diagnosis present

## 2021-05-19 NOTE — Therapy (Signed)
[facility] [address], [place], [address] Phone: [phone]   Fax:  [phone]  Physical Therapy Evaluation  Patient Details  Name: Cynthia George MRN: [id number] Date of Birth: 1986/02/13 Referring Provider (PT): Meade Maw, MD   Encounter Date: 05/19/2021   PT End of Session - 05/19/21 1320     Visit Number 1    Number of Visits 24    Date for PT Re-Evaluation 08/11/21    Authorization Type [facility] [employer] reporting period from 05/19/2021    Progress Note Due on Visit 10    PT Start Time 0955    PT Stop Time 1030    PT Time Calculation (min) 35 min    Activity Tolerance Patient tolerated treatment well;No increased pain    Behavior During Therapy WFL for tasks assessed/performed             Past Medical History:  Diagnosis Date   Anxiety    Depression    Hyperlipidemia    Migraines    Obesity    PCOS (polycystic ovarian syndrome)     Past Surgical History:  Procedure Laterality Date   APPENDECTOMY  2013   HUMERUS FRACTURE SURGERY Left 2002,2003   rod and screws placed and removed   TONSILLECTOMY AND ADENOIDECTOMY  2004   WISDOM TOOTH EXTRACTION  2011   All    There were no vitals filed for this visit.    Subjective Assessment - 05/19/21 1007     Subjective Patient states condition started about 4 months ago when she started having a lot of pain in her left arm. She felt her in her humerus and in her left finger and thumb. She went to [facility] and the provider was more worried about where she broke her humerus many years ago. He thought it was muscular. She went to her PCP who dx with tennis elbow. When she returned to [facility] and she thought it was coming from the neck since she had pain when they touched her neck. Cervical spine xrays were performed and she had 3 different injections (cortisone and torodol in her arm close to the elbow and upper arm). She also took  muscle relaxer, Toradol, gabapentin, prednisone dosepack (no effect). Then she had an C-spine MRI and her radiologist called the doctor and recommended surgery right away. She went to the chiropractor 3 times and then the neurosurgeon recommended surgery and recommended she stopped going to chiropractor. She states her pain started getting better and she could sleep better after she started with the chiropractor. She last went to the chiropractor in December. Chiropractor adjusted her neck, and did something to her spine. She did not get any exercises from the chiropractor, but she did get a cervical pillow. She was on restrictions for 6 weeks (no pushing/pulling/lifting over 10#). This time period has expired and they did not extend them. She is in school for nursing and wanted to try PT instead of surgery. She also would like to avoid injections. She states her condition has been getting better and she has been regaining strength. She states Dr. Izora Ribas was strongly recommending surgery but when she was telling him that her strength was getting better he said he was okay with her doing PT first. She repots no history of previous similar symptoms. She has no apparent injury. Symptoms have been symptoms over the first two digits and pain in the forearm over the extensor bundle,  and over the posterior elbow. The more it progressed she started feeling pain in her posterior left shoulder and neck. It has gotten better so now she only feels pain after a strenuous day at work. 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 She takes it easy if she has pain. Problems started after she was put on phentamine to help lose weight so she can get pregnant. She lost 35lbs in 3 months while she was no pheniramine. She is not currently pregnant.    Pertinent History Patient is a 36 y.o. female who presents to outpatient physical therapy with a referral for medical diagnosis cervical radiculopathy. This patient's chief  complaints consist of neck pain with left UE pain and paresthesia leading to the following functional deficits: currently she has to rest after work but is milnimal limitations. It used to bother her in prolonged sitting while doing schoolwork, had lifting restrictions at works, used to affect sleep (less than 1 hour at a time), restricted work duties or using left arm, dropping things, grooming/washing).  Relevant past medical history and comorbidities include morbid obesity, PCOS, HLD, hypothyroidism, migraines, B achilles tendinopathy. Sx history includes B achilles tendon surgery in the last 2 years, tonsils, two surgeries on left arm (humerus - fell as a teenager - no longer bothers her), wisdom teeth, appendectomy. Patient denies hx of cancer, diabetes.    Limitations House hold activities;Other (comment);Lifting    Diagnostic tests Cervical spine MRI report 03/30/2021: "IMPRESSION:  1. Bulky leftward disc extrusion at C6-C7. Mild to moderate spinal  stenosis with left hemi-cord mass effect, and severe involvement of  the proximal left neural foramen. Query Left C7 radiculitis. No  spinal cord signal abnormality. Recommend Spine Surgery  consultation.  2. C5-C6 disc and endplate degeneration with mild spinal stenosis.  And mild C4, mild to moderate right C5 neural foraminal stenosis  related to foraminal disc bulging and endplate spurring."    Currently in Pain? No/denies    Pain Score 0-No pain   W: 10/10 entire duration of condition, 2-3/10 last 2 weeks; B: 1/10   Pain Location Neck    Pain Orientation Left;Mid    Pain Descriptors / Indicators Sharp;Throbbing    Pain Type --   sub-acute   Pain Radiating Towards no longer goes down arm. used to got to first two digits.    Pain Onset More than a month ago    Pain Frequency Intermittent    Aggravating Factors  strenuous day at work    Pain Relieving Factors going home and resting, ice, heating pad, hot shower, no longer taking medication, arm ROM     Effect of Pain on Daily Activities currently she has to rest after work but is milnimal limitations; used to bother her in prolonged sitting while doing schoolwork, had lifting restrictions at works, used to affect sleep (less than 1 hour at a time), restricted work duties or using left arm, dropping things, grooming/washing).                [facility] PT Assessment - 05/19/21 0001       Assessment   Medical Diagnosis cervical radiculopathy    Referring Provider (PT) Meade Maw, MD    Onset Date/Surgical Date 01/16/21    Hand Dominance Right    Next MD Visit PCP in March for physical, none made with Dr. Izora Ribas    Prior Therapy none for this problem prior to current episode of care  Precautions   Precautions Other (comment)   was on 10# push/pull/lifting restriction but these expired     Restrictions   Weight Bearing Restrictions No      Balance Screen   Has the patient fallen in the past 6 months No    Has the patient had a decrease in activity level because of a fear of falling?  No    Is the patient reluctant to leave their home because of a fear of falling?  No      Prior Function   Level of Independence Independent    Vocation Full time employment;Student    Vocation Requirements standing, walking, bending, lifting, pushing    Leisure playing with son, working on prerequs for nursing school      Cognition   Overall Cognitive Status Within Functional Limits for tasks assessed             OBJECTIVE  SELF- REPORTED FUNCTION FOTO score: 60/100 (neck questionnaire)  OBSERVATION/INSPECTION Posture Posture (seated): very upright Anthropometrics Tremor: none Body composition: BMI 39.9 Muscle bulk: WNL Functional Mobility Transfers: sit <> stand WFL Gait: grossly WFL for household and short community ambulation. More detailed gait analysis deferred to later date as needed.   NEUROLOGICAL Dermatomes C2-T1 appears equal and intact to light  touch.  SPINE MOTION Cervical Spine AROM *Indicates pain Flexion: 55 Extension: 60, pain at left pec with OP Side Flexion:   R 45, mild pull at left base of neck with OP  L 40 Rotation:  R 60 L 55 Protraction: pulling at left pec/clavicular region Retraction: pulling at left pec/clavicular region (worse with OP).  No lingering pain/discomfort  PERIPHERAL JOINT MOTION (in degrees) Active Range of Motion (AROM) Comments: B UE WFL  MUSCLE PERFORMANCE (MMT):  *Indicates pain 05/19/20 Date Date  Joint/Motion R/L R/L R/L  Shoulder     Flexion 5/5 / /  Abduction (C5) 5/5* / /  External rotation 5/3+ / /  Internal rotation 5/5 / /  Extension 5/5 / /  Elbow     Flexion (C6) 4+/4+ / /  Extension (C7) 5/4+ / /  Wrist     Flexion (C7) 5/4+* / /  Extension (C6) 5/5 / /  Radial deviation 4+/4 / /  Ulnar deviation (C8) 4/3+ / /  Pronation 5/5 / /  Supination 4+/5 / /  Hand     Thumb extension (C8) 5/5 / /  Finger abduction (T1) B WFL / /  Grip (C8) / / /  Comments: L shoulder abduction, pain at lateral upper arm.  L wrist flexion pain at lateral wrist.   SPECIAL TESTS:  CERVICAL SPINE Cervical spine axial compression: negative Spurling's part B:  R = negative, L = negative  UE NEURODYNAMIC TESTS B AROM screen negative bilaterally for ulnar, radial, and median bias   REPEATED MOTIONS TESTING: - repeated cervical retraction 2x10 (improving symptoms)   Objective measurements completed on examination: See above findings.    TREATMENT:   Therapeutic exercise: to centralize symptoms and improve ROM, strength, muscular endurance, and activity tolerance required for successful completion of functional activities.  - seated cervical spine retraction AROM, 1x10 - standing with towel roll behind thoracic spine at wall AROM, 1x10 - Education on diagnosis, prognosis, POC, anatomy and physiology of current condition.   Pt required multimodal cuing for proper technique and to  facilitate improved neuromuscular control, strength, range of motion, and functional ability resulting in improved performance and form.    PT Education -  05/19/21 1320     Education Details Exercise purpose/form. Self management techniques. Education on diagnosis, prognosis, POC, anatomy and physiology of current condition Education on HEP    Person(s) Educated Patient    Methods Explanation;Demonstration;Tactile cues;Verbal cues    Comprehension Verbalized understanding;Returned demonstration;Verbal cues required;Tactile cues required;Need further instruction              PT Short Term Goals - 05/19/21 1322       PT SHORT TERM GOAL #1   Title Be independent with initial home exercise program for self-management of symptoms.    Baseline Initial HEP provided at IE (05/19/2021);    Time 2    Period Weeks    Status New    Target Date 08/11/21               PT Long Term Goals - 05/19/21 1323       PT LONG TERM GOAL #1   Title Be independent with a long-term home exercise program for self-management of symptoms.    Baseline Initial HEP provided at IE (05/19/2021);    Time 12    Period Weeks    Status New   TARGET DATE FOR ALL LONG TERM GOALS: 08/11/2021     PT LONG TERM GOAL #2   Title Demonstrate improved FOTO to equal or greater than 70 by visit #11 to demonstrate improvement in overall condition and self-reported functional ability.    Baseline 61 (05/19/2021);    Time 12    Period Weeks    Status New      PT LONG TERM GOAL #3   Title Patient will demonstrate L UE strength equal or greater than R UE strength to improve her ability to complete manual tasks such as pushing and pulling at work without difficulty.    Baseline L UE as low as 1.5 grade less than R  UE (05/19/2021);    Time 12    Period Weeks    Status New      PT LONG TERM GOAL #4   Title Patient will demonstrate cervical spine AROM rotation to equal or greater than 60 degrees each way to improve  ability to view surroundings.    Baseline L = 55 (05/19/2021);    Time 12    Period Weeks    Status New      PT LONG TERM GOAL #5   Title Patient will be able to place a 25# box overhead without increased sympoms to improve her ability to complete work activites.    Baseline rated quite a bit of difficulty (05/19/2021);    Time 12    Period Weeks    Status New      Additional Long Term Goals   Additional Long Term Goals Yes      PT LONG TERM GOAL #6   Title Complete community, work and/or recreational activities without limitation due to current condition.    Baseline currently she has to rest after work but is milnimal limitations; used to bother her in prolonged sitting while doing schoolwork, had lifting restrictions at works, used to affect sleep (less than 1 hour at a time), restricted work duties or using left arm, dropping things, grooming/washing) (05/19/2021);    Time 12    Period Weeks    Status New                    Plan - 05/19/21 1334     Clinical Impression Statement Patient is  a 36 y.o. female referred to outpatient physical therapy with a medical diagnosis of cervical radiculopathy who presents with signs and symptoms consistent with left sided cervical radiculopathy affecting the C7 nerve root that is significantly improved from onset. Patient presents with significant ROM, pain, joint stiffness, muscle performance (strength/power/endurance) and activity tolerance impairments that are limiting ability to complete her usual activities including lifting a 25# box overhead, working all day without difficulty. It has improved significantly from initial onset but it has previously severely limited her with activities including prolonged sitting while doing schoolwork, had lifting restrictions at work, used to affect sleep (less than 1 hour at a time), restricted work duties or using left arm, dropping things, grooming/washing). Patient will benefit from skilled physical  therapy intervention to address current body structure impairments and activity limitations to improve function and work towards goals set in current POC in order to return to prior level of function or maximal functional improvement.    Personal Factors and Comorbidities Comorbidity 3+;Fitness;Past/Current Experience    Comorbidities Relevant past medical history and comorbidities include morbid obesity, PCOS, HLD, hypothyroidism, migraines, B achilles tendinopathy. Sx history includes B achilles tendon surgery in the last 2 years, tonsils, two surgeries on left arm (humerus - fell as a teenager - no longer bothers her), wisdom teeth, appendectomy.    Examination-Activity Limitations Hygiene/Grooming;Lift;Caring for Others;Reach Overhead;Carry;Sit;Dressing;Sleep    Examination-Participation Restrictions Cleaning;Laundry;School;Occupation;Community Activity;Interpersonal Relationship;Meal Prep   used to bother her in prolonged sitting while doing schoolwork, had lifting restrictions at works, used to affect sleep (less than 1 hour at a time), restricted work duties or using left arm, dropping things, grooming/washing).   Stability/Clinical Decision Making Stable/Uncomplicated    Clinical Decision Making Low    Rehab Potential Good    PT Frequency 2x / week    PT Duration 12 weeks    PT Treatment/Interventions ADLs/Self Care Home Management;Cryotherapy;Moist Heat;Electrical Stimulation;Neuromuscular re-education;Therapeutic exercise;Therapeutic activities;Patient/family education;Manual techniques;Dry needling;Passive range of motion;Spinal Manipulations;Joint Manipulations    PT Next Visit Plan update HEP as appropriate, manual therapy as appropriate, repeated motions and strengthening as tolerated    PT Home Exercise Plan cervical retraction    Consulted and Agree with Plan of Care Patient             Patient will benefit from skilled therapeutic intervention in order to improve the following  deficits and impairments:  Pain, Increased muscle spasms, Decreased activity tolerance, Decreased endurance, Decreased range of motion, Decreased strength, Hypomobility, Impaired perceived functional ability  Visit Diagnosis: Radiculopathy, cervical region  Muscle weakness (generalized)     Problem List Patient Active Problem List   Diagnosis Date Noted   Cervical disc herniation 04/07/2021   Chest pain 01/01/2021   Infertility associated with anovulation 06/23/2019   Major depression, single episode, in complete remission ([place]) 06/22/2019   Postpartum hypertension 11/18/2016   Gestational hypertension without significant proteinuria 11/01/2016   Morbid obesity ([place]) 07/27/2016   Hypothyroid 06/30/2016   Elevated LFTs 05/27/2016   HLD (hyperlipidemia)    Migraines    PCOS (polycystic ovarian syndrome)     Clarise Cruz R. Graylon Good, PT, DPT 05/19/21, 1:37 PM   [facility] [address] [place], [address] Phone: [phone]   Fax:  [phone]  Name: Cynthia George MRN: [id number] Date of Birth: 07/21/1985

## 2021-05-26 ENCOUNTER — Ambulatory Visit: Payer: No Typology Code available for payment source | Admitting: Physical Therapy

## 2021-06-01 ENCOUNTER — Ambulatory Visit: Payer: No Typology Code available for payment source | Attending: Neurosurgery | Admitting: Physical Therapy

## 2021-06-01 ENCOUNTER — Encounter: Payer: Self-pay | Admitting: Physical Therapy

## 2021-06-01 ENCOUNTER — Other Ambulatory Visit: Payer: Self-pay

## 2021-06-01 DIAGNOSIS — M5412 Radiculopathy, cervical region: Secondary | ICD-10-CM | POA: Insufficient documentation

## 2021-06-01 DIAGNOSIS — M6281 Muscle weakness (generalized): Secondary | ICD-10-CM | POA: Diagnosis present

## 2021-06-01 DIAGNOSIS — M79661 Pain in right lower leg: Secondary | ICD-10-CM | POA: Diagnosis present

## 2021-06-01 DIAGNOSIS — M79662 Pain in left lower leg: Secondary | ICD-10-CM | POA: Diagnosis present

## 2021-06-01 DIAGNOSIS — R262 Difficulty in walking, not elsewhere classified: Secondary | ICD-10-CM | POA: Insufficient documentation

## 2021-06-01 NOTE — Therapy (Signed)
[facility] [address], [place], [address] Phone: [phone]   Fax:  [phone]  Physical Therapy Treatment  Patient Details  Name: Cynthia George MRN: [id number] Date of Birth: 12/04/1985 Referring Provider (PT): Meade Maw, MD   Encounter Date: 06/01/2021   PT End of Session - 06/01/21 0947     Visit Number 2    Number of Visits 24    Date for PT Re-Evaluation 08/11/21    Authorization Type [place] [employer] reporting period from 05/19/2021    Progress Note Due on Visit 10    PT Start Time 0903    PT Stop Time 0941    PT Time Calculation (min) 38 min    Activity Tolerance Patient tolerated treatment well    Behavior During Therapy [facility] for tasks assessed/performed             Past Medical History:  Diagnosis Date   Anxiety    Depression    Hyperlipidemia    Migraines    Obesity    PCOS (polycystic ovarian syndrome)     Past Surgical History:  Procedure Laterality Date   APPENDECTOMY  2013   HUMERUS FRACTURE SURGERY Left 2002,2003   rod and screws placed and removed   TONSILLECTOMY AND ADENOIDECTOMY  2004   WISDOM TOOTH EXTRACTION  2011   All    There were no vitals filed for this visit.   Subjective Assessment - 06/01/21 0907     Subjective Patient reports she is feeling well. She has no pain upon arrival and states the last time she had pain was some burning in the right side of her neck after doing a lot of twisting and turning while cleaning and re-organizing her son's room yesterday. She states the exercises are going pretty good. She does has some trouble putting the towel against the wall for cervical retractions.. She is doing them about once a day.    Pertinent History Patient is a 36 y.o. female who presents to outpatient physical therapy with a referral for medical diagnosis cervical radiculopathy. This patient's chief complaints consist of neck pain with left UE pain  and paresthesia leading to the following functional deficits: currently she has to rest after work but is milnimal limitations. It used to bother her in prolonged sitting while doing schoolwork, had lifting restrictions at works, used to affect sleep (less than 1 hour at a time), restricted work duties or using left arm, dropping things, grooming/washing).  Relevant past medical history and comorbidities include morbid obesity, PCOS, HLD, hypothyroidism, migraines, B achilles tendinopathy. Sx history includes B achilles tendon surgery in the last 2 years, tonsils, two surgeries on left arm (humerus - fell as a teenager - no longer bothers her), wisdom teeth, appendectomy. Patient denies hx of cancer, diabetes.    Limitations House hold activities;Other (comment);Lifting    Diagnostic tests Cervical spine MRI report 03/30/2021: "IMPRESSION:  1. Bulky leftward disc extrusion at C6-C7. Mild to moderate spinal  stenosis with left hemi-cord mass effect, and severe involvement of  the proximal left neural foramen. Query Left C7 radiculitis. No  spinal cord signal abnormality. Recommend Spine Surgery  consultation.  2. C5-C6 disc and endplate degeneration with mild spinal stenosis.  And mild C4, mild to moderate right C5 neural foraminal stenosis  related to foraminal disc bulging and endplate spurring."    Currently in Pain? No/denies  TREATMENT:    Therapeutic exercise: to centralize symptoms and improve ROM, strength, muscular endurance, and activity tolerance required for successful completion of functional activities.  - Upper body ergometer at level 5 encourage joint nutrition, warm tissue, induce analgesic effect of aerobic exercise, improve muscular strength and endurance,  and prepare for remainder of session. 5 min, switching directions every one minute.  - standing with towel roll behind thoracic spine at wall cervical spine retraction AROM, 1x10 no hold, 1x10 with 5 second hold,  2x10 resistance with yellow theraband. (Increasing left pec pain).  - seated cervical SNAG rotation, 2x10 left side only, 5 second holds. (3 reps to the right caused burning to start over the neck so discontinued).  - thoracic extension with hands crossed over chest and chin tucked (other forms caused burning over lower cervical spine). 1x10 (eventually caused increased burning over neck).  - Standing cervical thoracic extension/BUE flexion and serratus anterior activation, lat stretch, with foam roller up wall, 5 second holds, cuing for technique. 1x10 (feels stretch in the shoulders).  - seated overhead press, 2x10 with 5#Dbs - seated row at omega machine with bar, chest supported, 2x10 at 20#.  - Education on HEP including handout    Pt required multimodal cuing for proper technique and to facilitate improved neuromuscular control, strength, range of motion, and functional ability resulting in improved   HOME EXERCISE PROGRAM Access Code: [email or web address] URL: [email or web address] Date: 06/01/2021 Prepared by: Rosita Kea  Exercises Seated Assisted Cervical Rotation with Towel (Mirrored) - 2 x daily - 2 sets - 10 reps - 5 seconds hold Cervical Retraction with Resistance - 2 x daily - 3 sets - 10 reps    PT Education - 06/01/21 0946     Education Details Exercise purpose/form. Self management techniques.    Person(s) Educated Patient    Methods Explanation;Demonstration;Tactile cues;Verbal cues;Handout    Comprehension Verbalized understanding;Returned demonstration;Verbal cues required;Tactile cues required;Need further instruction              PT Short Term Goals - 05/19/21 1322       PT SHORT TERM GOAL #1   Title Be independent with initial home exercise program for self-management of symptoms.    Baseline Initial HEP provided at IE (05/19/2021);    Time 2    Period Weeks    Status New    Target Date 08/11/21               PT Long Term Goals - 05/19/21  1323       PT LONG TERM GOAL #1   Title Be independent with a long-term home exercise program for self-management of symptoms.    Baseline Initial HEP provided at IE (05/19/2021);    Time 12    Period Weeks    Status New   TARGET DATE FOR ALL LONG TERM GOALS: 08/11/2021     PT LONG TERM GOAL #2   Title Demonstrate improved FOTO to equal or greater than 70 by visit #11 to demonstrate improvement in overall condition and self-reported functional ability.    Baseline 61 (05/19/2021);    Time 12    Period Weeks    Status New      PT LONG TERM GOAL #3   Title Patient will demonstrate L UE strength equal or greater than R UE strength to improve her ability to complete manual tasks such as pushing and pulling at work without difficulty.    Baseline L UE as low as 1.5 grade  less than R  UE (05/19/2021);    Time 12    Period Weeks    Status New      PT LONG TERM GOAL #4   Title Patient will demonstrate cervical spine AROM rotation to equal or greater than 60 degrees each way to improve ability to view surroundings.    Baseline L = 55 (05/19/2021);    Time 12    Period Weeks    Status New      PT LONG TERM GOAL #5   Title Patient will be able to place a 25# box overhead without increased sympoms to improve her ability to complete work activites.    Baseline rated quite a bit of difficulty (05/19/2021);    Time 12    Period Weeks    Status New      Additional Long Term Goals   Additional Long Term Goals Yes      PT LONG TERM GOAL #6   Title Complete community, work and/or recreational activities without limitation due to current condition.    Baseline currently she has to rest after work but is milnimal limitations; used to bother her in prolonged sitting while doing schoolwork, had lifting restrictions at works, used to affect sleep (less than 1 hour at a time), restricted work duties or using left arm, dropping things, grooming/washing) (05/19/2021);    Time 12    Period Weeks    Status  New                   Plan - 06/01/21 0946     Clinical Impression Statement Patient tolerated treatment well overall with mild increase in burning sensation at neck and/or L pec region with some exercises that resolved with rest. Updated HEP to include well tolerated rotational stretches and retraction progression. Patient would benefit from continued management of limiting condition by skilled physical therapist to address remaining impairments and functional limitations to work towards stated goals and return to PLOF or maximal functional independence.    Personal Factors and Comorbidities Comorbidity 3+;Fitness;Past/Current Experience    Comorbidities Relevant past medical history and comorbidities include morbid obesity, PCOS, HLD, hypothyroidism, migraines, B achilles tendinopathy. Sx history includes B achilles tendon surgery in the last 2 years, tonsils, two surgeries on left arm (humerus - fell as a teenager - no longer bothers her), wisdom teeth, appendectomy.    Examination-Activity Limitations Hygiene/Grooming;Lift;Caring for Others;Reach Overhead;Carry;Sit;Dressing;Sleep    Examination-Participation Restrictions Cleaning;Laundry;School;Occupation;Community Activity;Interpersonal Relationship;Meal Prep   used to bother her in prolonged sitting while doing schoolwork, had lifting restrictions at works, used to affect sleep (less than 1 hour at a time), restricted work duties or using left arm, dropping things, grooming/washing).   Stability/Clinical Decision Making Stable/Uncomplicated    Rehab Potential Good    PT Frequency 2x / week    PT Duration 12 weeks    PT Treatment/Interventions ADLs/Self Care Home Management;Cryotherapy;Moist Heat;Electrical Stimulation;Neuromuscular re-education;Therapeutic exercise;Therapeutic activities;Patient/family education;Manual techniques;Dry needling;Passive range of motion;Spinal Manipulations;Joint Manipulations    PT Next Visit Plan update  HEP as appropriate, manual therapy as appropriate, repeated motions and strengthening as tolerated    PT Home Exercise Plan [employer] Access Code: [email or web address]    Consulted and Agree with Plan of Care Patient             Patient will benefit from skilled therapeutic intervention in order to improve the following deficits and impairments:  Pain, Increased muscle spasms, Decreased activity tolerance, Decreased endurance, Decreased range of motion, Decreased strength,  Hypomobility, Impaired perceived functional ability  Visit Diagnosis: Radiculopathy, cervical region  Muscle weakness (generalized)     Problem List Patient Active Problem List   Diagnosis Date Noted   Cervical disc herniation 04/07/2021   Chest pain 01/01/2021   Infertility associated with anovulation 06/23/2019   Major depression, single episode, in complete remission ([place]) 06/22/2019   Postpartum hypertension 11/18/2016   Gestational hypertension without significant proteinuria 11/01/2016   Morbid obesity ([place]) 07/27/2016   Hypothyroid 06/30/2016   Elevated LFTs 05/27/2016   HLD (hyperlipidemia)    Migraines    PCOS (polycystic ovarian syndrome)     Everlean Alstrom. Graylon Good, PT, DPT 06/01/21, 9:48 AM   [facility] [address] [place], [address] Phone: [phone]   Fax:  [phone]  Name: NOYA GRIFFUS MRN: [id number] Date of Birth: 1985-11-12

## 2021-06-02 ENCOUNTER — Other Ambulatory Visit: Payer: Self-pay

## 2021-06-02 ENCOUNTER — Encounter: Payer: No Typology Code available for payment source | Admitting: Physical Therapy

## 2021-06-09 ENCOUNTER — Ambulatory Visit: Payer: No Typology Code available for payment source | Admitting: Physical Therapy

## 2021-06-09 ENCOUNTER — Other Ambulatory Visit: Payer: Self-pay

## 2021-06-09 ENCOUNTER — Encounter: Payer: Self-pay | Admitting: Physical Therapy

## 2021-06-09 DIAGNOSIS — M5412 Radiculopathy, cervical region: Secondary | ICD-10-CM | POA: Diagnosis not present

## 2021-06-09 DIAGNOSIS — M6281 Muscle weakness (generalized): Secondary | ICD-10-CM

## 2021-06-09 NOTE — Therapy (Signed)
[facility] [address] [place], [address] Phone: [phone]   Fax:  [phone]  Physical Therapy Treatment  Patient Details  Name: Cynthia George MRN: [id number] Date of Birth: 23-Sep-1985 Referring Provider (PT): Meade Maw, MD   Encounter Date: 06/09/2021   PT End of Session - 06/09/21 1917     Visit Number 3    Number of Visits 24    Date for PT Re-Evaluation 08/11/21    Authorization Type [place] [employer] reporting period from 05/19/2021    Progress Note Due on Visit 10    PT Start Time 1825    PT Stop Time 1905    PT Time Calculation (min) 40 min    Activity Tolerance Patient tolerated treatment well    Behavior During Therapy [facility] for tasks assessed/performed             Past Medical History:  Diagnosis Date   Anxiety    Depression    Hyperlipidemia    Migraines    Obesity    PCOS (polycystic ovarian syndrome)     Past Surgical History:  Procedure Laterality Date   APPENDECTOMY  2013   HUMERUS FRACTURE SURGERY Left 2002,2003   rod and screws placed and removed   TONSILLECTOMY AND ADENOIDECTOMY  2004   WISDOM TOOTH EXTRACTION  2011   All    There were no vitals filed for this visit.   Subjective Assessment - 06/09/21 1830     Subjective Patient reports she has 2/10 pain in the central cervical spine and it is a burning feeling. She states she felt okay after last PT session but then her pain increased in the same region. She did have pain in the left arm once since last PT session but it wasn't bad. She did not do her HEP due to having pain and being afraid to aggrevate it more. She thinks she is getting better at picking up her son who is 38#.    Pertinent History Patient is a 36 y.o. female who presents to outpatient physical therapy with a referral for medical diagnosis cervical radiculopathy. This patient's chief complaints consist of neck pain with left UE pain and  paresthesia leading to the following functional deficits: currently she has to rest after work but is milnimal limitations. It used to bother her in prolonged sitting while doing schoolwork, had lifting restrictions at works, used to affect sleep (less than 1 hour at a time), restricted work duties or using left arm, dropping things, grooming/washing).  Relevant past medical history and comorbidities include morbid obesity, PCOS, HLD, hypothyroidism, migraines, B achilles tendinopathy. Sx history includes B achilles tendon surgery in the last 2 years, tonsils, two surgeries on left arm (humerus - fell as a teenager - no longer bothers her), wisdom teeth, appendectomy. Patient denies hx of cancer, diabetes.    Limitations House hold activities;Other (comment);Lifting    Diagnostic tests Cervical spine MRI report 03/30/2021: "IMPRESSION:  1. Bulky leftward disc extrusion at C6-C7. Mild to moderate spinal  stenosis with left hemi-cord mass effect, and severe involvement of  the proximal left neural foramen. Query Left C7 radiculitis. No  spinal cord signal abnormality. Recommend Spine Surgery  consultation.  2. C5-C6 disc and endplate degeneration with mild spinal stenosis.  And mild C4, mild to moderate right C5 neural foraminal stenosis  related to foraminal disc bulging and endplate spurring."    Currently in Pain? Yes    Pain  Score 2     Pain Location Neck               TREATMENT:    Therapeutic exercise: to centralize symptoms and improve ROM, strength, muscular endurance, and activity tolerance required for successful completion of functional activities.  - Upper body ergometer at level 5 encourage joint nutrition, warm tissue, induce analgesic effect of aerobic exercise, improve muscular strength and endurance,  and prepare for remainder of session. 6 min, switching directions every one minute.  - supine chin tuck, 3x10 with 5 second hold (L arm better) - seated cervical SNAG rotation, 2x10  left side only, 5 second holds. - prone on elbows cervical spine retraction, 3x10 - standing scapular row with 10# cable, 3x10, cuing for form.   - Education on HEP including handout   Manual therapy: to reduce pain and tissue tension, improve range of motion, neuromodulation, in order to promote improved ability to complete functional activities. SUPINE - STM to posterior cervical spine musculature focusing on left UT where it is tight and tender (increasing pain down L UE so discontinued, not TTP at suboccipitals or right UT).  - manual traction 3x30 seconds (relieves symptoms)   Pt required multimodal cuing for proper technique and to facilitate improved neuromuscular control, strength, range of motion, and functional ability resulting in improved    HOME EXERCISE PROGRAM Access Code: [email or web address] URL: [email or web address] Date: 06/09/2021 Prepared by: Norton BlizzardSara Mickey Esguerra  Exercises Seated Assisted Cervical Rotation with Towel (Mirrored) - 2 x daily - 2 sets - 10 reps - 5 seconds hold Supine Chin Tuck - 2 x daily - 3 sets - 10 reps - 5 seconds hold Cervical Retraction Prone on Elbows - 2 x daily - 3 sets - 10 reps - 5 hold     PT Education - 06/09/21 1916     Education Details Exercise purpose/form. Self management techniques.    Person(s) Educated Patient    Methods Explanation;Demonstration;Tactile cues;Verbal cues    Comprehension Verbalized understanding;Returned demonstration;Verbal cues required;Tactile cues required;Need further instruction              PT Short Term Goals - 05/19/21 1322       PT SHORT TERM GOAL #1   Title Be independent with initial home exercise program for self-management of symptoms.    Baseline Initial HEP provided at IE (05/19/2021);    Time 2    Period Weeks    Status New    Target Date 08/11/21               PT Long Term Goals - 05/19/21 1323       PT LONG TERM GOAL #1   Title Be independent with a long-term home  exercise program for self-management of symptoms.    Baseline Initial HEP provided at IE (05/19/2021);    Time 12    Period Weeks    Status New   TARGET DATE FOR ALL LONG TERM GOALS: 08/11/2021     PT LONG TERM GOAL #2   Title Demonstrate improved FOTO to equal or greater than 70 by visit #11 to demonstrate improvement in overall condition and self-reported functional ability.    Baseline 61 (05/19/2021);    Time 12    Period Weeks    Status New      PT LONG TERM GOAL #3   Title Patient will demonstrate L UE strength equal or greater than R UE strength to improve her ability to complete manual tasks such as  pushing and pulling at work without difficulty.    Baseline L UE as low as 1.5 grade less than R  UE (05/19/2021);    Time 12    Period Weeks    Status New      PT LONG TERM GOAL #4   Title Patient will demonstrate cervical spine AROM rotation to equal or greater than 60 degrees each way to improve ability to view surroundings.    Baseline L = 55 (05/19/2021);    Time 12    Period Weeks    Status New      PT LONG TERM GOAL #5   Title Patient will be able to place a 25# box overhead without increased sympoms to improve her ability to complete work activites.    Baseline rated quite a bit of difficulty (05/19/2021);    Time 12    Period Weeks    Status New      Additional Long Term Goals   Additional Long Term Goals Yes      PT LONG TERM GOAL #6   Title Complete community, work and/or recreational activities without limitation due to current condition.    Baseline currently she has to rest after work but is milnimal limitations; used to bother her in prolonged sitting while doing schoolwork, had lifting restrictions at works, used to affect sleep (less than 1 hour at a time), restricted work duties or using left arm, dropping things, grooming/washing) (05/19/2021);    Time 12    Period Weeks    Status New                   Plan - 06/09/21 1920     Clinical  Impression Statement Patient tolerated treatment well overall with decrease in pain in back of neck and and increased achy pain over the left pec region by end of session. Manual traction temporarily eliminated all symptoms and STM to the left UT increased left arm symptoms. Encouraged patient to participate better in HEP to improve benefit of PT. Progressed to deep neck flexor strengthening. Plan to continue neck and postural strengthening progression next session as tolerated. Patient would benefit from continued management of limiting condition by skilled physical therapist to address remaining impairments and functional limitations to work towards stated goals and return to PLOF or maximal functional independence.    Personal Factors and Comorbidities Comorbidity 3+;Fitness;Past/Current Experience    Comorbidities Relevant past medical history and comorbidities include morbid obesity, PCOS, HLD, hypothyroidism, migraines, B achilles tendinopathy. Sx history includes B achilles tendon surgery in the last 2 years, tonsils, two surgeries on left arm (humerus - fell as a teenager - no longer bothers her), wisdom teeth, appendectomy.    Examination-Activity Limitations Hygiene/Grooming;Lift;Caring for Others;Reach Overhead;Carry;Sit;Dressing;Sleep    Examination-Participation Restrictions Cleaning;Laundry;School;Occupation;Community Activity;Interpersonal Relationship;Meal Prep   used to bother her in prolonged sitting while doing schoolwork, had lifting restrictions at works, used to affect sleep (less than 1 hour at a time), restricted work duties or using left arm, dropping things, grooming/washing).   Stability/Clinical Decision Making Stable/Uncomplicated    Rehab Potential Good    PT Frequency 2x / week    PT Duration 12 weeks    PT Treatment/Interventions ADLs/Self Care Home Management;Cryotherapy;Moist Heat;Electrical Stimulation;Neuromuscular re-education;Therapeutic exercise;Therapeutic  activities;Patient/family education;Manual techniques;Dry needling;Passive range of motion;Spinal Manipulations;Joint Manipulations    PT Next Visit Plan update HEP as appropriate, manual therapy as appropriate, repeated motions and strengthening as tolerated    PT Home Exercise Plan [place] Access Code:  [email or web address] and Agree with Plan of Care Patient             Patient will benefit from skilled therapeutic intervention in order to improve the following deficits and impairments:  Pain, Increased muscle spasms, Decreased activity tolerance, Decreased endurance, Decreased range of motion, Decreased strength, Hypomobility, Impaired perceived functional ability  Visit Diagnosis: Radiculopathy, cervical region  Muscle weakness (generalized)     Problem List Patient Active Problem List   Diagnosis Date Noted   Cervical disc herniation 04/07/2021   Chest pain 01/01/2021   Infertility associated with anovulation 06/23/2019   Major depression, single episode, in complete remission ([place]) 06/22/2019   Postpartum hypertension 11/18/2016   Gestational hypertension without significant proteinuria 11/01/2016   Morbid obesity ([place]) 07/27/2016   Hypothyroid 06/30/2016   Elevated LFTs 05/27/2016   HLD (hyperlipidemia)    Migraines    PCOS (polycystic ovarian syndrome)     Clarise Cruz R. Graylon Good, PT, DPT 06/09/21, 7:20 PM   [facility] [address] [place], [address] Phone: [phone]   Fax:  [phone]  Name: Cynthia George MRN: [id number] Date of Birth: 1986/02/26

## 2021-06-16 ENCOUNTER — Encounter: Payer: Self-pay | Admitting: Physical Therapy

## 2021-06-16 ENCOUNTER — Other Ambulatory Visit: Payer: Self-pay

## 2021-06-16 ENCOUNTER — Ambulatory Visit: Payer: No Typology Code available for payment source | Admitting: Physical Therapy

## 2021-06-16 DIAGNOSIS — M79661 Pain in right lower leg: Secondary | ICD-10-CM

## 2021-06-16 DIAGNOSIS — M6281 Muscle weakness (generalized): Secondary | ICD-10-CM

## 2021-06-16 DIAGNOSIS — R262 Difficulty in walking, not elsewhere classified: Secondary | ICD-10-CM

## 2021-06-16 DIAGNOSIS — M5412 Radiculopathy, cervical region: Secondary | ICD-10-CM | POA: Diagnosis not present

## 2021-06-16 NOTE — Therapy (Signed)
[facility] [address], [place], [address] Phone: [phone]   Fax:  [phone]  Physical Therapy Treatment  Patient Details  Name: Cynthia George MRN: [id number] Date of Birth: [birth year] Referring Provider (PT): Meade Maw, MD   Encounter Date: 06/16/2021   PT End of Session - 06/16/21 1826     Visit Number 4    Number of Visits 24    Date for PT Re-Evaluation 08/11/21    Authorization Type [place] FOCUS reporting period from 05/19/2021    Progress Note Due on Visit 10    PT Start Time 1821    PT Stop Time 1900    PT Time Calculation (min) 39 min    Activity Tolerance Patient tolerated treatment well    Behavior During Therapy [facility] for tasks assessed/performed             Past Medical History:  Diagnosis Date   Anxiety    Depression    Hyperlipidemia    Migraines    Obesity    PCOS (polycystic ovarian syndrome)     Past Surgical History:  Procedure Laterality Date   APPENDECTOMY  2013   HUMERUS FRACTURE SURGERY Left 2002,2003   rod and screws placed and removed   TONSILLECTOMY AND ADENOIDECTOMY  2004   WISDOM TOOTH EXTRACTION  2011   All    There were no vitals filed for this visit.   Subjective Assessment - 06/16/21 1822     Subjective Patient reports she has 2-3/10 pain at the central cervical spine radiating to the top of the left shoulder today and hasn't been doing much since she picked up her son yesterday and it sent a shooting pain to left hand. 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 She was careful with what she was doing with her left arm and it did not make her pain worse. Patient reports she felt fine after her last PT session. She reports her HEP is going well. The SNAG to the left hurts a little bit but she thinks she was doing it too hard.    Pertinent History Patient is a 36 y.o. female who presents to outpatient physical therapy  with a referral for medical diagnosis cervical radiculopathy. This patient's chief complaints consist of neck pain with left UE pain and paresthesia leading to the following functional deficits: currently she has to rest after work but is milnimal limitations. It used to bother her in prolonged sitting while doing schoolwork, had lifting restrictions at works, used to affect sleep (less than 1 hour at a time), restricted work duties or using left arm, dropping things, grooming/washing).  Relevant past medical history and comorbidities include morbid obesity, PCOS, HLD, hypothyroidism, migraines, B achilles tendinopathy. Sx history includes B achilles tendon surgery in the last 2 years, tonsils, two surgeries on left arm (humerus - fell as a teenager - no longer bothers her), wisdom teeth, appendectomy. Patient denies hx of cancer, diabetes.    Limitations House hold activities;Other (comment);Lifting    Diagnostic tests Cervical spine MRI report 03/30/2021: "IMPRESSION:  1. Bulky leftward disc extrusion at C6-C7. Mild to moderate spinal  stenosis with left hemi-cord mass effect, and severe involvement of  the proximal left neural foramen. Query Left C7 radiculitis. No  spinal cord signal abnormality. Recommend Spine Surgery  consultation.  2. C5-C6 disc and endplate degeneration with mild spinal stenosis.  And mild C4, mild to moderate  right C5 neural foraminal stenosis  related to foraminal disc bulging and endplate spurring."    Currently in Pain? Yes    Pain Score 3                TREATMENT:    Therapeutic exercise: to centralize symptoms and improve ROM, strength, muscular endurance, and activity tolerance required for successful completion of functional activities.  - Upper body ergometer at level 5 encourage joint nutrition, warm tissue, induce analgesic effect of aerobic exercise, improve muscular strength and endurance,  and prepare for remainder of session. 5 min, switching directions every  one minute.  - seated cervical SNAG rotation, 2x10 left side only, 5 second holds. - supine chin tuck with lift, 2x10 with 10 second hold - prone lat pull on [employer] at level 6, 2x10  - standing serratus wall slide with YTB loop around hands, 2x10  (left UE tingling and pain to the left shoulder) - standing scapular row with 15# cable, 1x10, cuing for form.  (L UE symptoms no better)  Manual therapy: to reduce pain and tissue tension, improve range of motion, neuromodulation, in order to promote improved ability to complete functional activities. SUPINE - cervical spine manual traction, 5x30 seconds (improved symptoms) - STM to upper cervical spine/suboccipital muscles  Pt required multimodal cuing for proper technique and to facilitate improved neuromuscular control, strength, range of motion, and functional ability resulting in improved    HOME EXERCISE PROGRAM Access Code: [email or web address] URL: [email or web address] Date: 06/09/2021 Prepared by: Rosita Kea   Exercises Seated Assisted Cervical Rotation with Towel (Mirrored) - 2 x daily - 2 sets - 10 reps - 5 seconds hold Supine Chin Tuck - 2 x daily - 3 sets - 10 reps - 5 seconds hold Cervical Retraction Prone on Elbows - 2 x daily - 3 sets - 10 reps - 5 hold      PT Education - 06/16/21 1826     Education Details Exercise purpose/form. Self management techniques.    Person(s) Educated Patient    Methods Explanation;Demonstration;Tactile cues;Verbal cues    Comprehension Verbalized understanding;Returned demonstration;Verbal cues required;Tactile cues required;Need further instruction              PT Short Term Goals - 05/19/21 1322       PT SHORT TERM GOAL #1   Title Be independent with initial home exercise program for self-management of symptoms.    Baseline Initial HEP provided at IE (05/19/2021);    Time 2    Period Weeks    Status New    Target Date 08/11/21               PT Long Term Goals  - 05/19/21 1323       PT LONG TERM GOAL #1   Title Be independent with a long-term home exercise program for self-management of symptoms.    Baseline Initial HEP provided at IE (05/19/2021);    Time 12    Period Weeks    Status New   TARGET DATE FOR ALL LONG TERM GOALS: 08/11/2021     PT LONG TERM GOAL #2   Title Demonstrate improved FOTO to equal or greater than 70 by visit #11 to demonstrate improvement in overall condition and self-reported functional ability.    Baseline 61 (05/19/2021);    Time 12    Period Weeks    Status New      PT LONG TERM GOAL #3   Title Patient will demonstrate L UE  strength equal or greater than R UE strength to improve her ability to complete manual tasks such as pushing and pulling at work without difficulty.    Baseline L UE as low as 1.5 grade less than R  UE (05/19/2021);    Time 12    Period Weeks    Status New      PT LONG TERM GOAL #4   Title Patient will demonstrate cervical spine AROM rotation to equal or greater than 60 degrees each way to improve ability to view surroundings.    Baseline L = 55 (05/19/2021);    Time 12    Period Weeks    Status New      PT LONG TERM GOAL #5   Title Patient will be able to place a 25# box overhead without increased sympoms to improve her ability to complete work activites.    Baseline rated quite a bit of difficulty (05/19/2021);    Time 12    Period Weeks    Status New      Additional Long Term Goals   Additional Long Term Goals Yes      PT LONG TERM GOAL #6   Title Complete community, work and/or recreational activities without limitation due to current condition.    Baseline currently she has to rest after work but is milnimal limitations; used to bother her in prolonged sitting while doing schoolwork, had lifting restrictions at works, used to affect sleep (less than 1 hour at a time), restricted work duties or using left arm, dropping things, grooming/washing) (05/19/2021);    Time 12    Period Weeks     Status New                   Plan - 06/16/21 1914     Clinical Impression Statement Patient tolerated treatment with some difficulty with increasing symptoms with postural strengthening. Symptoms decreased with manual traction to less than when she arrived. Patient continues to have deficit in function such as picking up her son, pulling, pushing. Plan to continue working on postural strengthening and using manual therapy for symptom relief as needed. Patient would benefit from continued management of limiting condition by skilled physical therapist to address remaining impairments and functional limitations to work towards stated goals and return to PLOF or maximal functional independence.    Personal Factors and Comorbidities Comorbidity 3+;Fitness;Past/Current Experience    Comorbidities Relevant past medical history and comorbidities include morbid obesity, PCOS, HLD, hypothyroidism, migraines, B achilles tendinopathy. Sx history includes B achilles tendon surgery in the last 2 years, tonsils, two surgeries on left arm (humerus - fell as a teenager - no longer bothers her), wisdom teeth, appendectomy.    Examination-Activity Limitations Hygiene/Grooming;Lift;Caring for Others;Reach Overhead;Carry;Sit;Dressing;Sleep    Examination-Participation Restrictions Cleaning;Laundry;School;Occupation;Community Activity;Interpersonal Relationship;Meal Prep   used to bother her in prolonged sitting while doing schoolwork, had lifting restrictions at works, used to affect sleep (less than 1 hour at a time), restricted work duties or using left arm, dropping things, grooming/washing).   Stability/Clinical Decision Making Stable/Uncomplicated    Rehab Potential Good    PT Frequency 2x / week    PT Duration 12 weeks    PT Treatment/Interventions ADLs/Self Care Home Management;Cryotherapy;Moist Heat;Electrical Stimulation;Neuromuscular re-education;Therapeutic exercise;Therapeutic  activities;Patient/family education;Manual techniques;Dry needling;Passive range of motion;Spinal Manipulations;Joint Manipulations    PT Next Visit Plan update HEP as appropriate, manual therapy as appropriate, repeated motions and strengthening as tolerated    PT Home Exercise Plan [place] Access Code: [email or web address]  Consulted and Agree with Plan of Care Patient             Patient will benefit from skilled therapeutic intervention in order to improve the following deficits and impairments:  Pain, Increased muscle spasms, Decreased activity tolerance, Decreased endurance, Decreased range of motion, Decreased strength, Hypomobility, Impaired perceived functional ability  Visit Diagnosis: Radiculopathy, cervical region  Muscle weakness (generalized)  Bilateral calf pain  Difficulty in walking, not elsewhere classified     Problem List Patient Active Problem List   Diagnosis Date Noted   Cervical disc herniation 04/07/2021   Chest pain 01/01/2021   Infertility associated with anovulation 06/23/2019   Major depression, single episode, in complete remission ([place]) 06/22/2019   Postpartum hypertension 11/18/2016   Gestational hypertension without significant proteinuria 11/01/2016   Morbid obesity ([place]) 07/27/2016   Hypothyroid 06/30/2016   Elevated LFTs 05/27/2016   HLD (hyperlipidemia)    Migraines    PCOS (polycystic ovarian syndrome)     Clarise Cruz R. Graylon Good, PT, DPT 06/16/21, 7:15 PM   [facility] [address], [place], [address] Phone: [phone]   Fax:  [phone]  Name: PETINA REYNEN MRN: [id number] Date of Birth: 24-Aug-1985

## 2021-06-23 ENCOUNTER — Ambulatory Visit: Payer: No Typology Code available for payment source | Admitting: Physical Therapy

## 2021-06-30 ENCOUNTER — Telehealth: Payer: Self-pay | Admitting: Physical Therapy

## 2021-06-30 ENCOUNTER — Ambulatory Visit: Payer: No Typology Code available for payment source | Attending: Neurosurgery | Admitting: Physical Therapy

## 2021-06-30 DIAGNOSIS — M5412 Radiculopathy, cervical region: Secondary | ICD-10-CM | POA: Insufficient documentation

## 2021-06-30 DIAGNOSIS — M6281 Muscle weakness (generalized): Secondary | ICD-10-CM | POA: Insufficient documentation

## 2021-06-30 NOTE — Telephone Encounter (Signed)
Called patient when she did not come to her appointment scheduled for 6:15pm today. Left VM requesting call back. ? ?Cynthia George. Graylon Good, PT, DPT ?06/30/21, 6:46 PM ? ?

## 2021-07-07 ENCOUNTER — Ambulatory Visit: Payer: No Typology Code available for payment source | Admitting: Physical Therapy

## 2021-07-07 ENCOUNTER — Encounter: Payer: Self-pay | Admitting: Physical Therapy

## 2021-07-07 ENCOUNTER — Other Ambulatory Visit: Payer: Self-pay

## 2021-07-07 DIAGNOSIS — M6281 Muscle weakness (generalized): Secondary | ICD-10-CM | POA: Diagnosis present

## 2021-07-07 DIAGNOSIS — M5412 Radiculopathy, cervical region: Secondary | ICD-10-CM

## 2021-07-07 NOTE — Therapy (Signed)
?OUTPATIENT PHYSICAL THERAPY TREATMENT NOTE ? ? ?Patient Name: Cynthia George ?MRN: [id number] ?DOB:07/03/1985, 36 y.o., female ?Today's Date: 07/07/2021 ? ?PCP: Valerie Roys, DO ?REFERRING PROVIDER: Meade Maw, MD ? ? PT End of Session - 07/07/21 1829   ? ? Visit Number 5   ? Number of Visits 24   ? Date for PT Re-Evaluation 08/11/21   ? Authorization Type [place] FOCUS reporting period from 05/19/2021   ? Progress Note Due on Visit 10   ? PT Start Time 1822   ? PT Stop Time 1900   ? PT Time Calculation (min) 38 min   ? Activity Tolerance Patient tolerated treatment well   ? Behavior During Therapy [facility] for tasks assessed/performed   ? ?  ?  ? ?  ? ? ?Past Medical History:  ?Diagnosis Date  ? Anxiety   ? Depression   ? Hyperlipidemia   ? Migraines   ? Obesity   ? PCOS (polycystic ovarian syndrome)   ? ?Past Surgical History:  ?Procedure Laterality Date  ? APPENDECTOMY  2013  ? HUMERUS FRACTURE SURGERY Left 2002,2003  ? rod and screws placed and removed  ? TONSILLECTOMY AND ADENOIDECTOMY  2004  ? [place] EXTRACTION  2011  ? All  ? ?Patient Active Problem List  ? Diagnosis Date Noted  ? Cervical disc herniation 04/07/2021  ? Chest pain 01/01/2021  ? Infertility associated with anovulation 06/23/2019  ? Major depression, single episode, in complete remission ([place]) 06/22/2019  ? Postpartum hypertension 11/18/2016  ? Gestational hypertension without significant proteinuria 11/01/2016  ? Morbid obesity ([place]) 07/27/2016  ? Hypothyroid 06/30/2016  ? Elevated LFTs 05/27/2016  ? HLD (hyperlipidemia)   ? Migraines   ? PCOS (polycystic ovarian syndrome)   ? ? ?REFERRING DIAG: cervical radiculopathy ? ?THERAPY DIAG:  ?Radiculopathy, cervical region ? ?Muscle weakness (generalized) ? ?PERTINENT HISTORY: Patient is a 36 y.o. female who presents to outpatient physical therapy with a referral for medical diagnosis cervical radiculopathy. This patient's chief complaints consist of neck pain with left UE pain and  paresthesia leading to the following functional deficits: currently she has to rest after work but is milnimal limitations. It used to bother her in prolonged sitting while doing schoolwork, had lifting restrictions at works, used to affect sleep (less than 1 hour at a time), restricted work duties or using left arm, dropping things, grooming/washing). Relevant past medical history and comorbidities include morbid obesity, PCOS, HLD, hypothyroidism, migraines, B achilles tendinopathy. Sx history includes B achilles tendon surgery in the last 2 years, tonsils, two surgeries on left arm (humerus - fell as a teenager - no longer bothers her), wisdom teeth, appendectomy. Patient denies hx of cancer, diabetes. ? ?PRECAUTIONS: was on 10# push/pull/lifting restriction but these expired ? ?SUBJECTIVE: Patient reports she missed PT the last two weeks first because she had a virus and last week got her days mixed up and slept through it. She has switched to night shift and she loves it. It works better with her son and his school and her school. She feels her neck and left arm have been better. She has been resting her arm a lot and avoiding know irritants to it at work. She picked her son up to chest height yesterday without a problem. She currently has no pain or paresthesia. She has been doing her HEP regularly.  ? ?PAIN:  ?Are you having pain? No ? ? OBJECTIVE ? ? SELF-REPORTED FUNCTION ? FOTO score: 68/100 (neck questionnaire) ? ?  TODAY'S TREATMENT:  ? ?Therapeutic exercise: to centralize symptoms and improve ROM, strength, muscular endurance, and activity tolerance required for successful completion of functional activities.  ?- Upper body ergometer at level 5 encourage joint nutrition, warm tissue, induce analgesic effect of aerobic exercise, improve muscular strength and endurance,  and prepare for remainder of session. 5 min, switching directions every one minute.  ?- standing scapular row with cable, 3x10 at  15/20/20#, cuing for form. ?- standing B shoulder extension with palms facing forwards, 3x10 with red theraband.  ?- standing serratus wall slide lift off with YTB loop around hands, 3x10  ?- prone lat pull on [employer] at level 8, 2x10 (reports increased discomfort by end of last set).  ? ?Pt required multimodal cuing for proper technique and to facilitate improved neuromuscular control, strength, range of motion, and functional ability resulting in improved  ?  ?HOME EXERCISE PROGRAM ?Access Code: [email or web address] ?URL: [email or web address] ?Date: 06/09/2021 ?Prepared by: Rosita Kea ?  ?Exercises ?Seated Assisted Cervical Rotation with Towel (Mirrored) - 2 x daily - 2 sets - 10 reps - 5 seconds hold ?Supine Chin Tuck - 2 x daily - 3 sets - 10 reps - 5 seconds hold ?Cervical Retraction Prone on Elbows - 2 x daily - 3 sets - 10 reps - 5 hold ?  ? ? ?PATIENT EDUCATION: ?Education details: Exercise purpose/form. Self management techniques.  ?Person educated: Patient ?Education method: Explanation and Demonstration ?Education comprehension: verbalized understanding, returned demonstration, verbal cues required, and tactile cues required ? ? ?HOME EXERCISE PROGRAM: ?Access Code: [email or web address] ?URL: [email or web address] ?Date: 06/09/2021 ?Prepared by: Rosita Kea ?  ?Exercises ?Seated Assisted Cervical Rotation with Towel (Mirrored) - 2 x daily - 2 sets - 10 reps - 5 seconds hold ?Supine Chin Tuck - 2 x daily - 3 sets - 10 reps - 5 seconds hold ?Cervical Retraction Prone on Elbows - 2 x daily - 3 sets - 10 reps - 5 hold ? ? ? PT Short Term Goals   ? ?  ? PT SHORT TERM GOAL #1  ? Title Be independent with initial home exercise program for self-management of symptoms.   ? Baseline Initial HEP provided at IE (05/19/2021);   ? Time 2   ? Period Weeks   ? Status Met  ? Target Date 08/11/21   ? ?  ?  ? ?  ? ? ? PT Long Term Goals   ? TARGET DATE FOR ALL LONG TERM GOALS: 08/11/2021 ?  ? PT LONG TERM GOAL #1  ?  Title Be independent with a long-term home exercise program for self-management of symptoms.   ? Baseline Initial HEP provided at IE (05/19/2021);   ? Time 12   ? Period Weeks   ? Status progressing  ?  ? PT LONG TERM GOAL #2  ? Title Demonstrate improved FOTO to equal or greater than 70 by visit #11 to demonstrate improvement in overall condition and self-reported functional ability.   ? Baseline 61 (05/19/2021); 68 at visit #5 (07/07/2021);   ? Time 12   ? Period Weeks   ? Status Progressing  ?  ? PT LONG TERM GOAL #3  ? Title Patient will demonstrate L UE strength equal or greater than R UE strength to improve her ability to complete manual tasks such as pushing and pulling at work without difficulty.   ? Baseline L UE as low as 1.5 grade less than R  UE (05/19/2021);   ? Time 12   ?  Period Weeks   ? Status ongoing  ?  ? PT LONG TERM GOAL #4  ? Title Patient will demonstrate cervical spine AROM rotation to equal or greater than 60 degrees each way to improve ability to view surroundings.   ? Baseline L = 55 (05/19/2021);   ? Time 12   ? Period Weeks   ? Status ongoing  ?  ? PT LONG TERM GOAL #5  ? Title Patient will be able to place a 25# box overhead without increased sympoms to improve her ability to complete work activites.   ? Baseline rated quite a bit of difficulty (05/19/2021);   ? Time 12   ? Period Weeks   ? Status ongoing  ?  ? Additional Long Term Goals  ? Additional Long Term Goals Yes   ?  ? PT LONG TERM GOAL #6  ? Title Complete community, work and/or recreational activities without limitation due to current condition.   ? Baseline currently she has to rest after work but is milnimal limitations; used to bother her in prolonged sitting while doing schoolwork, had lifting restrictions at works, used to affect sleep (less than 1 hour at a time), restricted work duties or using left arm, dropping things, grooming/washing) (05/19/2021);   ? Time 12   ? Period Weeks   ? Status Progressing  ? ?  ?  ? ?  ? ? ?  Plan   ? ? Clinical Impression Statement Patient tolerated treatment well with no increase in pain until end of session when she reported mild irritaiton in the left UE after prone lat pull exercise. Session focuse

## 2021-07-13 ENCOUNTER — Other Ambulatory Visit: Payer: Self-pay

## 2021-07-13 ENCOUNTER — Ambulatory Visit (INDEPENDENT_AMBULATORY_CARE_PROVIDER_SITE_OTHER): Payer: No Typology Code available for payment source | Admitting: Certified Nurse Midwife

## 2021-07-13 VITALS — BP 128/80 | HR 96 | Ht 63.0 in | Wt 236.3 lb

## 2021-07-13 DIAGNOSIS — N970 Female infertility associated with anovulation: Secondary | ICD-10-CM | POA: Diagnosis not present

## 2021-07-13 LAB — FETAL NONSTRESS TEST

## 2021-07-13 MED ORDER — CLOMID 50 MG PO TABS
50.0000 mg | ORAL_TABLET | Freq: Every day | ORAL | 0 refills | Status: AC
  Filled 2021-07-13: qty 5, 5d supply, fill #0

## 2021-07-13 NOTE — Progress Notes (Signed)
GYN ENCOUNTER NOTE ? ?Subjective:  ?    ? Cynthia George is a 36 y.o. G79P1001 female is here for gynecologic evaluation of the following issues:  ?1. Infertility. Pt has history of PCOS and infertility. She has been trying to conceive. Has used clomid in the past to conceive her first baby .  Pt state she has been having menses for the past several months.  ?  ?Gynecologic History ?No LMP recorded (lmp unknown). (Menstrual status: Irregular Periods). ?Contraception: none ?Last Pap: 11/24/2018. Results were: normal ?Last mammogram: n/a .  ? ?Obstetric History ?OB History  ?Gravida Para Term Preterm AB Living  ?_0 ?SAB IAB Ectopic Multiple Live Births  ?      0 1  ?  ?# Outcome Date GA Lbr Len/2nd Weight Sex Delivery Anes PTL Lv  ?1 Term 11/20/16 [redacted]w[redacted]d/ 02:39 7 lb 4.8 oz (3.31 kg) M Vag-Spont EPI  LIV  ? ? ?Past Medical History:  ?Diagnosis Date  ? Anxiety   ? Depression   ? Hyperlipidemia   ? Migraines   ? Obesity   ? PCOS (polycystic ovarian syndrome)   ? ? ?Past Surgical History:  ?Procedure Laterality Date  ? APPENDECTOMY  2013  ? HUMERUS FRACTURE SURGERY Left 2002,2003  ? rod and screws placed and removed  ? TONSILLECTOMY AND ADENOIDECTOMY  2004  [place]  ? All  ? ? ?Current Outpatient Medications on File Prior to Visit  ?Medication Sig Dispense Refill  ? levothyroxine (SYNTHROID) 100 MCG tablet TAKE 1 TABLET BY MOUTH DAILY BEFORE BREAKFAST. 90 tablet 3  ? baclofen (LIORESAL) 10 MG tablet Take 1 tablet (10 mg total) by mouth at bedtime as needed for muscle spasms. (Patient not taking: Reported on 07/13/2021) 30 each 0  ? cyclobenzaprine (FLEXERIL) 5 MG tablet Take 1 tablet twice a day by oral route as needed for 15 days. (Patient not taking: Reported on 07/13/2021) 30 tablet 0  ? gabapentin (NEURONTIN) 300 MG capsule Take 1 capsule (300 mg total) by mouth 3 (three) times daily. (Patient not taking: Reported on 07/13/2021) 90 capsule 3  ? lidocaine (LIDODERM) 5 % Place 1 patch onto  the skin daily. Remove & Discard patch within 12 hours or as directed by MD (Patient not taking: Reported on 07/13/2021) 30 patch 0  ? medroxyPROGESTERone (PROVERA) 10 MG tablet Take 1 tablet (10 mg total) by mouth daily. Use for ten days (Patient not taking: Reported on 07/13/2021) 10 tablet 2  ? meloxicam (MOBIC) 15 MG tablet Take 1 tablet every day by oral route for 30 days. (Patient not taking: Reported on 07/13/2021) 30 tablet 0  ? omeprazole (PRILOSEC) 20 MG capsule Take 1 capsule (20 mg total) by mouth daily. (Patient not taking: Reported on 07/13/2021) 30 capsule 1  ? ?No current facility-administered medications on file prior to visit.  ? ? ?Allergies  ?Allergen Reactions  ? Flagyl [Metronidazole] Rash  ? Keflex [Cephalexin] Rash  ? Septra [Sulfamethoxazole-Trimethoprim] Rash  ? ? ?Social History  ? ?Socioeconomic History  ? Marital status: Married  ?  Spouse name: Not on file  ? Number of children: Not on file  ? Years of education: Not on file  ? Highest education level: Not on file  ?Occupational History  ? Not on file  ?Tobacco Use  ? Smoking status: Never  ? Smokeless tobacco: Never  ?Vaping Use  ? Vaping Use: Never used  ?Substance  and Sexual Activity  ? Alcohol use: No  ? Drug use: No  ? Sexual activity: Yes  ?  Birth control/protection: None  ?Other Topics Concern  ? Not on file  ?Social History Narrative  ? Not on file  ? ?Social Determinants of Health  ? ?Financial Resource Strain: Not on file  ?Food Insecurity: Not on file  ?Transportation Needs: Not on file  ?Physical Activity: Not on file  ?Stress: Not on file  ?Social Connections: Not on file  ?Intimate Partner Violence: Not on file  ? ? ?Family History  ?Problem Relation Age of Onset  ? Heart disease Mother   ? Hypertension Mother   ? Hyperlipidemia Mother   ? Heart attack Mother   ? Hyperlipidemia Father   ? Hypertension Father   ? Diabetes Father   ? Asthma Father   ? Heart attack Father 22  ? Hyperlipidemia Sister   ? Asthma Sister   ?  Polycystic ovary syndrome Sister   ? Mental illness Sister   ?     Depression  ? Mental illness Brother   ?     Depression  ? Diabetes Maternal Grandmother   ? Hyperlipidemia Maternal Grandmother   ? Hypertension Maternal Grandmother   ? Diabetes Maternal Grandfather   ? Hypertension Maternal Grandfather   ? Heart attack Maternal Grandfather   ? Diabetes Paternal Grandmother   ? Heart attack Paternal Grandfather   ? ? ?The following portions of the patient's history were reviewed and updated as appropriate: allergies, current medications, past family history, past medical history, past social history, past surgical history and problem list. ? ?Review of Systems ?Review of Systems - Negative except as mentioned in HPI ?Review of Systems - General ROS: negative for - chills, fatigue, fever, hot flashes, malaise or night sweats ?Hematological and Lymphatic ROS: negative for - bleeding problems or swollen lymph nodes ?Gastrointestinal ROS: negative for - abdominal pain, blood in stools, change in bowel habits and nausea/vomiting ?Musculoskeletal ROS: negative for - joint pain, muscle pain or muscular weakness ?Genito-Urinary ROS: negative for - change in menstrual cycle, dysmenorrhea, dyspareunia, dysuria, genital discharge, genital ulcers, hematuria, incontinence, irregular/heavy menses, nocturia or pelvic pain ? ?Objective:  ? ?BP 128/80   Pulse 96   Ht _0  (1.6 m)   Wt 236 lb 4.8 oz (107.2 kg)   LMP  (LMP Unknown)   BMI 41.86 kg/m?  ?CONSTITUTIONAL: Well-developed, well-nourished female in no acute distress.  ?HENT:  Normocephalic, atraumatic.  ?NECK: Normal range of motion, supple, no masses.  Normal thyroid.  ?SKIN: Skin is warm and dry. No rash noted. Not diaphoretic. No erythema. No pallor. ?[place]: Alert and oriented to person, place, and time. PSYCHIATRIC: Normal mood and affect. Normal behavior. Normal judgment and thought content. ?CARDIOVASCULAR:Not Examined ?RESPIRATORY: Not Examined ?BREASTS: Not  Examined ?ABDOMEN: Soft, non distended; Non tender.  No Organomegaly. ?PELVIC:not indicated ?MUSCULOSKELETAL: Normal range of motion. No tenderness.  No cyanosis, clubbing, or edema. ? ? ? ? ?Assessment:  ? ?Infertility  ?PCOS ? ? ?Plan:  ? ?Discussed use of clomid and femara. Pt used clomid in the past and was successful in concieving on dose of 150 mg. Reviewed risks and benefits of use. Discussed how medicaiton is taking. Pt is on day 8 of her cycle. Will have to wait for next cycle. She verbalize understanding. Discussed progesterone level day 21 or 22, encouraged intercourse daily or every other day day 10-16. Also encouraged ovulation kit. She verbalizes and agrees. Discussed  encouraged weight loss, healthy diet, use of PNV. Pt to follow up with positive home pregnancy test or if she gets her period.  ? ?Face to face time 15 min.  ? ?Philip Aspen, CNM   ?

## 2021-07-13 NOTE — Patient Instructions (Signed)
Clomiphene Tablets ?What is this medication? ?CLOMIPHENE (KLOE mi feen) treats irregular or absent ovulation in people trying to get pregnant. It works by helping your body produce an egg (ovulation), which increases the chance of pregnancy. ?This medicine may be used for other purposes; ask your health care provider or pharmacist if you have questions. ?COMMON BRAND NAME(S): Clomid, Serophene ?What should I tell my care team before I take this medication? ?They need to know if you have any of these conditions: ?Adrenal gland disease ?Blood vessel disease or blood clots ?Cyst on the ovary ?Endometriosis ?Liver disease ?Ovarian cancer ?Pituitary gland disease ?Vaginal bleeding that has not been evaluated ?An unusual or allergic reaction to clomiphene, other medications, foods, dyes, or preservatives ?Pregnant (should not be used if you are already pregnant) ?Breast-feeding ?How should I use this medication? ?Take this medication by mouth with a glass of water. Follow the directions on the prescription label. Take exactly as directed for the exact number of days prescribed. Take your doses at regular intervals. Most women take this medication for a 5-day period, but the length of treatment may be adjusted. Your care team will give you a start date for this medication and will give you instructions on proper use. Do not take your medication more often than directed. ?Talk to your care team about the use of this medication in children. Special care may be needed. ?Overdosage: If you think you have taken too much of this medicine contact a poison control center or emergency room at once. ?NOTE: This medicine is only for you. Do not share this medicine with others. ?What if I miss a dose? ?If you miss a dose, take it as soon as you can. If it is almost time for your next dose, take only that dose. Do not take double or extra doses. ?What may interact with this medication? ?Herbal or dietary supplements, like blue cohosh,  black cohosh, chasteberry, or DHEA ?Prasterone ?This list may not describe all possible interactions. Give your health care provider a list of all the medicines, herbs, non-prescription drugs, or dietary supplements you use. Also tell them if you smoke, drink alcohol, or use illegal drugs. Some items may interact with your medicine. ?What should I watch for while using this medication? ?Make sure you understand how and when to use this medication. You need to know when you are ovulating and when to have sexual intercourse. This will increase the chance of a pregnancy. ?Visit your care team for regular checks on your progress. You may need tests to check the hormone levels in your blood or you may have to use home-urine tests to check for ovulation. Try to keep any appointments. ?Compared to other fertility treatments, this medication does not greatly increase your chances of having multiple babies. An increased chance of having twins may occur in roughly 5 out of every 100 women who take this medication. ?Stop taking this medication at once and contact your care team if you think you are pregnant. ?This medication is not for long-term use. Most women that benefit from this medication do so within the first three cycles (months). Your care team will monitor your condition. This medication is usually used for a total of 6 cycles of treatment. ?You may get drowsy or dizzy. Do not drive, use machinery, or do anything that needs mental alertness until you know how this medication affects you. Do not stand or sit up quickly. This reduces the risk of dizzy or fainting spells. ?Drinking   alcoholic beverages or smoking tobacco may decrease your chance of becoming pregnant. Limit or stop alcohol and tobacco use during your fertility treatments. ?What side effects may I notice from receiving this medication? ?Side effects that you should report to your care team as soon as possible: ?Allergic reactions--skin rash, itching,  hives, swelling of the face, lips, tongue, or throat ?Change in vision ?Ovarian hyperstimulation syndrome--stomach or pelvic pain, bloating, nausea, vomiting, diarrhea, weight gain ?Pancreatitis--severe stomach pain that spreads to your back or gets worse after eating or when touched, fever, nausea, vomiting ?Side effects that usually do not require medical attention (report to your care team if they continue or are bothersome): ?Breast pain or tenderness ?Headache ?Hot flashes ?Irregular menstrual cycles or spotting ?This list may not describe all possible side effects. Call your doctor for medical advice about side effects. You may report side effects to FDA at 1-800-FDA-1088. ?Where should I keep my medication? ?Keep out of the reach of children. ?Store at room temperature between 15 and 30 degrees C (59 and 86 degrees F). Protect from heat, light, and moisture. Throw away any unused medication after the expiration date. ?NOTE: This sheet is a summary. It may not cover all possible information. If you have questions about this medicine, talk to your doctor, pharmacist, or health care provider. ?? 2022 Elsevier/Gold Standard (2020-07-23 00:00:00) ? ?

## 2021-07-14 ENCOUNTER — Encounter: Payer: No Typology Code available for payment source | Admitting: Physical Therapy

## 2021-07-15 ENCOUNTER — Other Ambulatory Visit: Payer: Self-pay

## 2021-07-15 ENCOUNTER — Ambulatory Visit: Payer: No Typology Code available for payment source | Admitting: Physical Therapy

## 2021-07-15 ENCOUNTER — Encounter: Payer: Self-pay | Admitting: Physical Therapy

## 2021-07-15 DIAGNOSIS — M5412 Radiculopathy, cervical region: Secondary | ICD-10-CM

## 2021-07-15 DIAGNOSIS — M6281 Muscle weakness (generalized): Secondary | ICD-10-CM

## 2021-07-15 NOTE — Therapy (Signed)
?OUTPATIENT PHYSICAL THERAPY TREATMENT NOTE / DISCHARGE SUMMARY ?Dates of reporting from 05/19/2021 to 07/15/2021 ? ? ?Patient Name: Cynthia George ?MRN: [id number] ?DOB:1986/01/24, 36 y.o., female ?Today's Date: 07/15/2021 ? ?PCP: Valerie Roys, DO ?REFERRING PROVIDER: Meade Maw, MD ? ? PT End of Session - 07/15/21 0905   ? ? Visit Number 6   ? Number of Visits 24   ? Date for PT Re-Evaluation 08/11/21   ? Authorization Type [place] [facility] reporting period from 05/19/2021   ? Progress Note Due on Visit 10   ? PT Start Time 0900   ? PT Stop Time 0940   ? PT Time Calculation (min) 40 min   ? Activity Tolerance Patient tolerated treatment well   ? Behavior During Therapy [facility] for tasks assessed/performed   ? ?  ?  ? ?  ? ? ? ?Past Medical History:  ?Diagnosis Date  ? Anxiety   ? Depression   ? Hyperlipidemia   ? Migraines   ? Obesity   ? PCOS (polycystic ovarian syndrome)   ? ?Past Surgical History:  ?Procedure Laterality Date  ? APPENDECTOMY  2013  ? HUMERUS FRACTURE SURGERY Left 2002,2003  ? rod and screws placed and removed  ? TONSILLECTOMY AND ADENOIDECTOMY  2004  ? [place] EXTRACTION  2011  ? All  ? ?Patient Active Problem List  ? Diagnosis Date Noted  ? Cervical disc herniation 04/07/2021  ? Infertility associated with anovulation 06/23/2019  ? Major depression, single episode, in complete remission ([place]) 06/22/2019  ? Morbid obesity ([place]) 07/27/2016  ? Hypothyroid 06/30/2016  ? Migraines   ? PCOS (polycystic ovarian syndrome)   ? ? ?REFERRING DIAG: cervical radiculopathy ? ?THERAPY DIAG:  ?Radiculopathy, cervical region ? ?Muscle weakness (generalized) ? ?PERTINENT HISTORY: Patient is a 36 y.o. female who presents to outpatient physical therapy with a referral for medical diagnosis cervical radiculopathy. This patient's chief complaints consist of neck pain with left UE pain and paresthesia leading to the following functional deficits: currently she has to rest after work but is milnimal  limitations. It used to bother her in prolonged sitting while doing schoolwork, had lifting restrictions at works, used to affect sleep (less than 1 hour at a time), restricted work duties or using left arm, dropping things, grooming/washing). Relevant past medical history and comorbidities include morbid obesity, PCOS, HLD, hypothyroidism, migraines, B achilles tendinopathy. Sx history includes B achilles tendon surgery in the last 2 years, tonsils, two surgeries on left arm (humerus - fell as a teenager - no longer bothers her), wisdom teeth, appendectomy. Patient denies hx of cancer, diabetes. ? ?PRECAUTIONS: was on 10# push/pull/lifting restriction but these expired ? ?SUBJECTIVE: Patient reports she is feeling well with no pain or symptoms upon arrival. She states she feels comfortable discharging today and does not have anything in particular that she feels she needs help with. She states she had some soreness after last PT session that went away.  ? ?PAIN:  ?Are you having pain? No ? ?OBJECTIVE ? ?SELF-REPORTED FUNCTION ?FOTO score: 77/100 (neck questionnaire) ? ?SPINE MOTION ?Cervical Spine AROM ?*Indicates pain ?Flexion: 50 ?Extension: 65 ?Side Flexion:  ?      R 43 ?      L 45 ?Rotation:  ?R 70 ?L 70 ?  ?MUSCLE PERFORMANCE (MMT):  ?*Indicates pain 05/19/20 07/15/21 Date  ?Joint/Motion R/L R/L R/L  ?Shoulder        ?Flexion 5/5 5/5 /  ?Abduction (C5) 5/5* 5/5 /  ?External  rotation 5/3+ 5/5 /  ?Internal rotation 5/5 5/5 /  ?Extension 5/5 / /  ?Elbow        ?Flexion (C6) 4+/4+ 5/5 /  ?Extension (C7) 5/4+ 5/4+ /  ?Wrist        ?Flexion (C7) 5/4+* 4+/5 /  ?Extension (C6) 5/5 5/5 /  ?Radial deviation 4+/4 5/4+ /  ?Ulnar deviation (C8) 4/3+ 4/3+ /  ?Pronation 5/5 5/5 /  ?Supination 4+/5 4+/4+ /  ?Hand        ?Thumb extension (C8) 5/5 5/5 /  ?Finger abduction (T1) B WFL B WFL /  ? ?  ?FUNCTIONAL TESTS: ?Box lift waist to overhead: 25.5# one rep no problems.  ? ? ? TODAY'S TREATMENT:  ? ?Therapeutic exercise: to  centralize symptoms and improve ROM, strength, muscular endurance, and activity tolerance required for successful completion of functional activities.  ?- Upper body ergometer at level 5 encourage joint nutrition, warm tissue, induce analgesic effect of aerobic exercise, improve muscular strength and endurance,  and prepare for remainder of session. 5 min, switching directions every one minute.  ?- measurements to assess progress (see above).  ?- standing scapular row with cable, 3x10 at 20#, cuing for form. ?- seated lat pull on OMEGA machine  at 35# 3x10  ?- standing B shoulder extension with palms facing forwards, 3x10 with green theraband. ?- standing serratus wall slide lift off with YTB loop around hands, 3x10  ? ?Pt required multimodal cuing for proper technique and to facilitate improved neuromuscular control, strength, range of motion, and functional ability resulting in improved  ?  ? ? ?PATIENT EDUCATION: ?Education details: Exercise purpose/form. Self management techniques. Discharge recommendations.  ?Person educated: Patient ?Education method: Explanation and Demonstration ?Education comprehension: verbalized understanding, returned demonstration, verbal cues required, and tactile cues required ? ? ?HOME EXERCISE PROGRAM: ?Access Code: [email or web address] ?URL: [email or web address] ?Date: 06/09/2021 ?Prepared by: Rosita Kea ?  ?Exercises ?Seated Assisted Cervical Rotation with Towel (Mirrored) - 2 x daily - 2 sets - 10 reps - 5 seconds hold ?Supine Chin Tuck - 2 x daily - 3 sets - 10 reps - 5 seconds hold ?Cervical Retraction Prone on Elbows - 2 x daily - 3 sets - 10 reps - 5 hold ? ? ? PT Short Term Goals   ? ?  ? PT SHORT TERM GOAL #1  ? Title Be independent with initial home exercise program for self-management of symptoms.   ? Baseline Initial HEP provided at IE (05/19/2021);   ? Time 2   ? Period Weeks   ? Status Met  ? Target Date 08/11/21   ? ?  ?  ? ?  ? ? ? PT Long Term Goals   ? TARGET  DATE FOR ALL LONG TERM GOALS: 08/11/2021 ?  ? PT LONG TERM GOAL #1  ? Title Be independent with a long-term home exercise program for self-management of symptoms.   ? Baseline Initial HEP provided at IE (05/19/2021); participating as prescribed (07/15/2021);   ? Time 12   ? Period Weeks   ? Status Met 07/15/2021  ?  ? PT LONG TERM GOAL #2  ? Title Demonstrate improved FOTO to equal or greater than 70 by visit #11 to demonstrate improvement in overall condition and self-reported functional ability.   ? Baseline 61 (05/19/2021); 68 at visit #5 (07/07/2021); 77 at visit #6 (07/15/2021);   ? Time 12   ? Period Weeks   ? Status Achieved 07/15/2021  ?  ? PT LONG TERM  GOAL #3  ? Title Patient will demonstrate L UE strength equal or greater than R UE strength to improve her ability to complete manual tasks such as pushing and pulling at work without difficulty.   ? Baseline L UE as low as 1.5 grade less than R  UE (05/19/2021); L UE within 1/2 grade of R UE (07/15/2021);   ? Time 12   ? Period Weeks   ? Status Partially met  ?  ? PT LONG TERM GOAL #4  ? Title Patient will demonstrate cervical spine AROM rotation to equal or greater than 60 degrees each way to improve ability to view surroundings.   ? Baseline L = 55 (05/19/2021); 70 each side (07/15/2021);   ? Time 12   ? Period Weeks   ? Status Achieved 07/15/2021  ?  ? PT LONG TERM GOAL #5  ? Title Patient will be able to place a 25# box overhead without increased sympoms to improve her ability to complete work activites.   ? Baseline rated quite a bit of difficulty (05/19/2021);   ? Time 12   ? Period Weeks   ? Status Achieved 07/15/2021  ?  ? Additional Long Term Goals  ? Additional Long Term Goals Yes   ?  ? PT LONG TERM GOAL #6  ? Title Complete community, work and/or recreational activities without limitation due to current condition.   ? Baseline currently she has to rest after work but is milnimal limitations; used to bother her in prolonged sitting while doing schoolwork, had  lifting restrictions at works, used to affect sleep (less than 1 hour at a time), restricted work duties or using left arm, dropping things, grooming/washing) (05/19/2021);  no longer bothering her (07/15/2021);   ? Time

## 2021-07-21 ENCOUNTER — Encounter: Payer: No Typology Code available for payment source | Admitting: Physical Therapy

## 2021-07-30 ENCOUNTER — Other Ambulatory Visit: Payer: Self-pay

## 2021-07-30 ENCOUNTER — Encounter: Payer: Self-pay | Admitting: Family Medicine

## 2021-07-30 ENCOUNTER — Ambulatory Visit (INDEPENDENT_AMBULATORY_CARE_PROVIDER_SITE_OTHER): Payer: No Typology Code available for payment source | Admitting: Family Medicine

## 2021-07-30 VITALS — BP 123/77 | HR 68 | Temp 98.6°F | Ht 63.0 in | Wt 237.0 lb

## 2021-07-30 DIAGNOSIS — E039 Hypothyroidism, unspecified: Secondary | ICD-10-CM

## 2021-07-30 DIAGNOSIS — F325 Major depressive disorder, single episode, in full remission: Secondary | ICD-10-CM | POA: Diagnosis not present

## 2021-07-30 DIAGNOSIS — Z0000 Encounter for general adult medical examination without abnormal findings: Secondary | ICD-10-CM

## 2021-07-30 DIAGNOSIS — Z6841 Body Mass Index (BMI) 40.0 and over, adult: Secondary | ICD-10-CM

## 2021-07-30 DIAGNOSIS — E66813 Obesity, class 3: Secondary | ICD-10-CM

## 2021-07-30 LAB — URINALYSIS, ROUTINE W REFLEX MICROSCOPIC
Bilirubin, UA: NEGATIVE
Glucose, UA: NEGATIVE
Ketones, UA: NEGATIVE
Leukocytes,UA: NEGATIVE
Nitrite, UA: NEGATIVE
Protein,UA: NEGATIVE
Specific Gravity, UA: >1.03 — ABNORMAL HIGH (ref 1.005–1.030)
Urobilinogen, Ur: 0.2 mg/dL (ref 0.2–1.0)
pH, UA: 5.5 (ref 5.0–7.5)

## 2021-07-30 LAB — MICROSCOPIC EXAMINATION
Epithelial Cells (non renal): NONE SEEN /hpf (ref 0–10)
WBC, UA: NONE SEEN /hpf (ref 0–5)

## 2021-07-30 MED ORDER — HYDROXYZINE PAMOATE 25 MG PO CAPS
25.0000 mg | ORAL_CAPSULE | Freq: Three times a day (TID) | ORAL | 12 refills | Status: DC | PRN
  Filled 2021-07-30: qty 30, 10d supply, fill #0

## 2021-07-30 NOTE — Assessment & Plan Note (Signed)
Doing well, but having more anxiety. Will start hydroxyzine. Call with any concerns. Continue to monitor.  ?

## 2021-07-30 NOTE — Assessment & Plan Note (Signed)
Continue diet and exercise. Call with any concerns. Continue to monitor.  

## 2021-07-30 NOTE — Assessment & Plan Note (Signed)
Rechecking labs today. Await results. Treat as needed.  °

## 2021-07-30 NOTE — Progress Notes (Signed)
? ?BP 123/77   Pulse 68   Temp 98.6 ?F (37 ?C)   Ht 5\' 3"  (1.6 m)   Wt 237 lb (107.5 kg)   LMP  (LMP Unknown)   SpO2 97%   BMI 41.98 kg/m?   ? ?Subjective:  ? ? Patient ID: Cynthia George, female    DOB: 1985/09/29, 36 y.o.   MRN: [id number] ? ?HPI: ?Cynthia George is a 36 y.o. female presenting on 07/30/2021 for comprehensive medical examination. Current medical complaints include: ? ?HYPOTHYROIDISM ?Thyroid control status:controlled ?Satisfied with current treatment? yes ?Medication side effects: no ?Medication compliance: excellent compliance ?Recent dose adjustment:no ?Fatigue: yes ?Cold intolerance: no ?Heat intolerance: no ?Weight gain: no ?Weight loss: no ?Constipation: no ?Diarrhea/loose stools: no ?Palpitations: no ?Lower extremity edema: no ?Anxiety/depressed mood: yes ? ?ANXIETY/STRESS ?Duration: chronic ?Status:worse ?Anxious mood: yes  ?Excessive worrying: yes ?Irritability: yes  ?Sweating: no ?Nausea: no ?Palpitations:no ?Hyperventilation: no ?Panic attacks: no ?Agoraphobia: no  ?Obscessions/compulsions: no ?Depressed mood: no ? ?  07/30/2021  ? 10:16 AM 04/07/2021  ? 10:42 AM 03/17/2021  ?  8:57 AM 03/03/2021  ?  3:25 PM 07/24/2020  ? 10:14 AM  ?Depression screen PHQ 2/9  ?Decreased Interest 0 0 0 0 0  ?Down, Depressed, Hopeless 0 0 0 0 0  ?PHQ - 2 Score 0 0 0 0 0  ?Altered sleeping 0 0 0 0 0  ?Tired, decreased energy 0 0 0 0 0  ?Change in appetite 0 0 0 0 0  ?Feeling bad or failure about yourself  0 0 0 0 0  ?Trouble concentrating 0 0 0 0 0  ?Moving slowly or fidgety/restless 0 0 0 0 0  ?Suicidal thoughts 0 0 0 0 0  ?PHQ-9 Score 0 0 0 0 0  ? ?Anhedonia: no ?Weight changes: no ?Insomnia: no   ?Hypersomnia: no ?Fatigue/loss of energy: no ?Feelings of worthlessness: no ?Feelings of guilt: no ?Impaired concentration/indecisiveness: no ?Suicidal ideations: no  ?Crying spells: no ?Recent Stressors/Life Changes: yes ?  Relationship problems: no ?  Family stress: no   ?  Financial stress: no  ?  Job  stress: yes  ?  Recent death/loss: no ? ?She currently lives with: husband and son ?Menopausal Symptoms: no ? ?Depression Screen done today and results listed below:  ? ?  07/30/2021  ? 10:16 AM 04/07/2021  ? 10:42 AM 03/17/2021  ?  8:57 AM 03/03/2021  ?  3:25 PM 07/24/2020  ? 10:14 AM  ?Depression screen PHQ 2/9  ?Decreased Interest 0 0 0 0 0  ?Down, Depressed, Hopeless 0 0 0 0 0  ?PHQ - 2 Score 0 0 0 0 0  ?Altered sleeping 0 0 0 0 0  ?Tired, decreased energy 0 0 0 0 0  ?Change in appetite 0 0 0 0 0  ?Feeling bad or failure about yourself  0 0 0 0 0  ?Trouble concentrating 0 0 0 0 0  ?Moving slowly or fidgety/restless 0 0 0 0 0  ?Suicidal thoughts 0 0 0 0 0  ?PHQ-9 Score 0 0 0 0 0  ? ? ?Past Medical History:  ?Past Medical History:  ?Diagnosis Date  ? Anxiety   ? Depression   ? Hyperlipidemia   ? Migraines   ? Obesity   ? PCOS (polycystic ovarian syndrome)   ? ? ?Surgical History:  ?Past Surgical History:  ?Procedure Laterality Date  ? APPENDECTOMY  2013  ? HUMERUS FRACTURE SURGERY Left 2002,2003  ? rod and screws  placed and removed  ? TONSILLECTOMY AND ADENOIDECTOMY  2004  ? WISDOM TOOTH EXTRACTION  2011  ? All  ? ? ?Medications:  ?Current Outpatient Medications on File Prior to Visit  ?Medication Sig  ? clomiPHENE (CLOMID) 50 MG tablet Take by mouth daily.  ? levothyroxine (SYNTHROID) 100 MCG tablet TAKE 1 TABLET BY MOUTH DAILY BEFORE BREAKFAST.  ? ?No current facility-administered medications on file prior to visit.  ? ? ?Allergies:  ?Allergies  ?Allergen Reactions  ? Flagyl [Metronidazole] Rash  ? Keflex [Cephalexin] Rash  ? Septra [Sulfamethoxazole-Trimethoprim] Rash  ? ? ?Social History:  ?Social History  ? ?Socioeconomic History  ? Marital status: Married  ?  Spouse name: Not on file  ? Number of children: Not on file  ? Years of education: Not on file  ? Highest education level: Not on file  ?Occupational History  ? Not on file  ?Tobacco Use  ? Smoking status: Never  ? Smokeless tobacco: Never  ?Vaping Use  ?  Vaping Use: Never used  ?Substance and Sexual Activity  ? Alcohol use: No  ? Drug use: No  ? Sexual activity: Yes  ?  Birth control/protection: None  ?Other Topics Concern  ? Not on file  ?Social History Narrative  ? Not on file  ? ?Social Determinants of Health  ? ?Financial Resource Strain: Not on file  ?Food Insecurity: Not on file  ?Transportation Needs: Not on file  ?Physical Activity: Not on file  ?Stress: Not on file  ?Social Connections: Not on file  ?Intimate Partner Violence: Not on file  ? ?Social History  ? ?Tobacco Use  ?Smoking Status Never  ?Smokeless Tobacco Never  ? ?Social History  ? ?Substance and Sexual Activity  ?Alcohol Use No  ? ? ?Family History:  ?Family History  ?Problem Relation Age of Onset  ? Heart disease Mother   ? Hypertension Mother   ? Hyperlipidemia Mother   ? Heart attack Mother   ? Hyperlipidemia Father   ? Hypertension Father   ? Diabetes Father   ? Asthma Father   ? Heart attack Father [id number]  ? Hyperlipidemia Sister   ? Asthma Sister   ? Polycystic ovary syndrome Sister   ? Mental illness Sister   ?     Depression  ? Mental illness Brother   ?     Depression  ? Diabetes Maternal Grandmother   ? Hyperlipidemia Maternal Grandmother   ? Hypertension Maternal Grandmother   ? Diabetes Maternal Grandfather   ? Hypertension Maternal Grandfather   ? Heart attack Maternal Grandfather   ? Diabetes Paternal Grandmother   ? Heart attack Paternal Grandfather   ? ? ?Past medical history, surgical history, medications, allergies, family history and social history reviewed with patient today and changes made to appropriate areas of the chart.  ? ?Review of Systems  ?Constitutional: Negative.   ?HENT: Negative.    ?Eyes: Negative.   ?Respiratory: Negative.    ?Cardiovascular: Negative.   ?Gastrointestinal: Negative.   ?Genitourinary: Negative.   ?Musculoskeletal: Negative.   ?Skin: Negative.   ?Neurological: Negative.   ?Endo/Heme/Allergies: Negative.   ?Psychiatric/Behavioral: Negative.    ?All  other ROS negative except what is listed above and in the HPI.  ? ?   ?Objective:  ?  ?BP 123/77   Pulse 68   Temp 98.6 ?F (37 ?C)   Ht 5\' 3"  (1.6 m)   Wt 237 lb (107.5 kg)   LMP  (LMP Unknown)   SpO2  97%   BMI 41.98 kg/m?   ?Wt Readings from Last 3 Encounters:  ?07/30/21 237 lb (107.5 kg)  ?07/13/21 236 lb 4.8 oz (107.2 kg)  ?04/10/21 225 lb (102.1 kg)  ?  ?Physical Exam ?Vitals and nursing note reviewed.  ?Constitutional:   ?   General: She is not in acute distress. ?   Appearance: Normal appearance. She is not ill-appearing, toxic-appearing or diaphoretic.  ?HENT:  ?   Head: Normocephalic and atraumatic.  ?   Right Ear: Tympanic membrane, ear canal and external ear normal. There is no impacted cerumen.  ?   Left Ear: Tympanic membrane, ear canal and external ear normal. There is no impacted cerumen.  ?   Nose: Nose normal. No congestion or rhinorrhea.  ?   Mouth/Throat:  ?   Mouth: Mucous membranes are moist.  ?   Pharynx: Oropharynx is clear. No oropharyngeal exudate or posterior oropharyngeal erythema.  ?Eyes:  ?   General: No scleral icterus.    ?   Right eye: No discharge.     ?   Left eye: No discharge.  ?   Extraocular Movements: Extraocular movements intact.  ?   Conjunctiva/sclera: Conjunctivae normal.  ?   Pupils: Pupils are equal, round, and reactive to light.  ?Neck:  ?   Vascular: No carotid bruit.  ?Cardiovascular:  ?   Rate and Rhythm: Normal rate and regular rhythm.  ?   Pulses: Normal pulses.  ?   Heart sounds: No murmur heard. ?  No friction rub. No gallop.  ?Pulmonary:  ?   Effort: Pulmonary effort is normal. No respiratory distress.  ?   Breath sounds: Normal breath sounds. No stridor. No wheezing, rhonchi or rales.  ?Chest:  ?   Chest wall: No tenderness.  ?Abdominal:  ?   General: Abdomen is flat. Bowel sounds are normal. There is no distension.  ?   Palpations: Abdomen is soft. There is no mass.  ?   Tenderness: There is no abdominal tenderness. There is no right CVA tenderness, left  CVA tenderness, guarding or rebound.  ?   Hernia: No hernia is present.  ?Genitourinary: ?   Comments: Breast and pelvic exams deferred with shared decision making ?Musculoskeletal:     ?   General: No swelling, tendernes

## 2021-07-31 LAB — HEPATITIS C ANTIBODY: Hep C Virus Ab: NONREACTIVE

## 2021-07-31 LAB — COMPREHENSIVE METABOLIC PANEL
ALT: 20 IU/L (ref 0–32)
AST: 15 IU/L (ref 0–40)
Albumin/Globulin Ratio: 1.7 (ref 1.2–2.2)
Albumin: 4.5 g/dL (ref 3.8–4.8)
Alkaline Phosphatase: 54 IU/L (ref 44–121)
BUN/Creatinine Ratio: 14 (ref 9–23)
BUN: 10 mg/dL (ref 6–20)
Bilirubin Total: 0.4 mg/dL (ref 0.0–1.2)
CO2: 23 mmol/L (ref 20–29)
Calcium: 9.2 mg/dL (ref 8.7–10.2)
Chloride: 106 mmol/L (ref 96–106)
Creatinine, Ser: 0.73 mg/dL (ref 0.57–1.00)
Globulin, Total: 2.7 g/dL (ref 1.5–4.5)
Glucose: 93 mg/dL (ref 70–99)
Potassium: 4.7 mmol/L (ref 3.5–5.2)
Sodium: 143 mmol/L (ref 134–144)
Total Protein: 7.2 g/dL (ref 6.0–8.5)
eGFR: 110 mL/min/{1.73_m2} (ref >59)

## 2021-07-31 LAB — LIPID PANEL W/O CHOL/HDL RATIO
Cholesterol, Total: 206 mg/dL — ABNORMAL HIGH (ref 100–199)
HDL: 33 mg/dL — ABNORMAL LOW (ref >39)
LDL Chol Calc (NIH): 145 mg/dL — ABNORMAL HIGH (ref 0–99)
Triglycerides: 153 mg/dL — ABNORMAL HIGH (ref 0–149)
VLDL Cholesterol Cal: 28 mg/dL (ref 5–40)

## 2021-07-31 LAB — CBC WITH DIFFERENTIAL/PLATELET
Basophils Absolute: 0.1 10*3/uL (ref 0.0–0.2)
Basos: 2 %
EOS (ABSOLUTE): 0.3 10*3/uL (ref 0.0–0.4)
Eos: 5 %
Hematocrit: 41.4 % (ref 34.0–46.6)
Hemoglobin: 12.4 g/dL (ref 11.1–15.9)
Immature Grans (Abs): 0 10*3/uL (ref 0.0–0.1)
Immature Granulocytes: 0 %
Lymphocytes Absolute: 1.7 10*3/uL (ref 0.7–3.1)
Lymphs: 28 %
MCH: 24.9 pg — ABNORMAL LOW (ref 26.6–33.0)
MCHC: 30 g/dL — ABNORMAL LOW (ref 31.5–35.7)
MCV: 83 fL (ref 79–97)
Monocytes Absolute: 0.5 10*3/uL (ref 0.1–0.9)
Monocytes: 9 %
Neutrophils Absolute: 3.4 10*3/uL (ref 1.4–7.0)
Neutrophils: 56 %
Platelets: 300 10*3/uL (ref 150–450)
RBC: 4.98 x10E6/uL (ref 3.77–5.28)
RDW: 13.7 % (ref 11.7–15.4)
WBC: 6.1 10*3/uL (ref 3.4–10.8)

## 2021-07-31 LAB — TSH: TSH: 2.17 u[IU]/mL (ref 0.450–4.500)

## 2021-08-14 ENCOUNTER — Other Ambulatory Visit: Payer: No Typology Code available for payment source

## 2021-08-14 DIAGNOSIS — N970 Female infertility associated with anovulation: Secondary | ICD-10-CM

## 2021-08-15 LAB — PROGESTERONE: Progesterone: 0.8 ng/mL

## 2021-08-25 ENCOUNTER — Other Ambulatory Visit: Payer: Self-pay

## 2021-08-25 ENCOUNTER — Other Ambulatory Visit: Payer: Self-pay | Admitting: Certified Nurse Midwife

## 2021-08-25 ENCOUNTER — Encounter: Payer: Self-pay | Admitting: Certified Nurse Midwife

## 2021-08-25 MED ORDER — CLOMIPHENE CITRATE 50 MG PO TABS
50.0000 mg | ORAL_TABLET | Freq: Every day | ORAL | 0 refills | Status: DC
  Filled 2021-08-25: qty 5, 5d supply, fill #0

## 2021-09-14 ENCOUNTER — Other Ambulatory Visit: Payer: Self-pay | Admitting: Family Medicine

## 2021-09-14 DIAGNOSIS — E039 Hypothyroidism, unspecified: Secondary | ICD-10-CM

## 2021-09-15 ENCOUNTER — Other Ambulatory Visit: Payer: Self-pay | Admitting: Family Medicine

## 2021-09-15 ENCOUNTER — Other Ambulatory Visit: Payer: Self-pay

## 2021-09-15 DIAGNOSIS — E039 Hypothyroidism, unspecified: Secondary | ICD-10-CM

## 2021-09-15 NOTE — Telephone Encounter (Signed)
Pt called back to check status of request, says she will be completely out of her current supply by tomorrow.

## 2021-09-15 NOTE — Telephone Encounter (Unsigned)
Copied from [place] [id number]. Topic: General - Other >> Sep 15, 2021  1:13 PM Tessa Lerner A wrote: Reason for CRM: Medication Refill - Medication: Rx #: [id number]  levothyroxine (SYNTHROID) 100 MCG tablet [id number] ENDED  Has the patient contacted their pharmacy? Yes.   (Agent: If no, request that the patient contact the pharmacy for the refill. If patient does not wish to contact the pharmacy document the reason why and proceed with request.) (Agent: If yes, when and what did the pharmacy advise?)  Preferred Pharmacy (with phone number or street name): [facility] [place] [address] Phone: [phone] Fax: [phone] Hours: M-F 7:30a-5:30p  Has the patient been seen for an appointment in the last year OR does the patient have an upcoming appointment? Yes.    Agent: Please be advised that RX refills may take up to 3 business days. We ask that you follow-up with your pharmacy.

## 2021-09-16 NOTE — Telephone Encounter (Signed)
Requested medication (s) are due for refill today: expired medication  Requested medication (s) are on the active medication list: yes  Last refill:  07/25/20-09/02/21 #90 3 refills  Future visit scheduled: yes in 10 months  Notes to clinic:  expired medication . Patient out of medication      Requested Prescriptions  Pending Prescriptions Disp Refills   levothyroxine (SYNTHROID) 100 MCG tablet 90 tablet 3    Sig: TAKE 1 TABLET BY MOUTH DAILY BEFORE BREAKFAST.     Endocrinology:  Hypothyroid Agents Passed - 09/15/2021  3:25 PM      Passed - TSH in normal range and within 360 days    TSH  Date Value Ref Range Status  07/30/2021 2.170 0.450 - 4.500 uIU/mL Final         Passed - Valid encounter within last 12 months    Recent Outpatient Visits           1 month ago Routine general medical examination at a health care facility   [facility], [place] P, DO   5 months ago Cervical disc herniation   [facility], [place] P, DO   6 months ago Radicular pain in left arm   [place], Megan P, DO   6 months ago Lateral epicondylitis of left elbow   [place], [place] P, DO   8 months ago Chest pain, unspecified type   [facility], Scheryl Darter, NP       Future Appointments             In 10 months Johnson, Barb Merino, DO [place], [facility]

## 2021-09-16 NOTE — Telephone Encounter (Signed)
Duplicate request. Requested Prescriptions  Refused Prescriptions Disp Refills  . levothyroxine (SYNTHROID) 100 MCG tablet 90 tablet 3    Sig: TAKE 1 TABLET BY MOUTH DAILY BEFORE BREAKFAST.     Endocrinology:  Hypothyroid Agents Passed - 09/15/2021  3:25 PM      Passed - TSH in normal range and within 360 days    TSH  Date Value Ref Range Status  07/30/2021 2.170 0.450 - 4.500 uIU/mL Final         Passed - Valid encounter within last 12 months    Recent Outpatient Visits          1 month ago Routine general medical examination at a health care facility   [facility], [place] P, DO   5 months ago Cervical disc herniation   [facility], [place] P, DO   6 months ago Radicular pain in left arm   [place], Megan P, DO   6 months ago Lateral epicondylitis of left elbow   [place], Megan P, DO   8 months ago Chest pain, unspecified type   [facility], Scheryl Darter, NP      Future Appointments            In 10 months Johnson, Barb Merino, DO [place], [facility]

## 2021-09-17 ENCOUNTER — Encounter: Payer: Self-pay | Admitting: Family Medicine

## 2021-09-17 ENCOUNTER — Other Ambulatory Visit: Payer: Self-pay

## 2021-09-17 DIAGNOSIS — E039 Hypothyroidism, unspecified: Secondary | ICD-10-CM

## 2021-09-17 MED ORDER — LEVOTHYROXINE SODIUM 100 MCG PO TABS
ORAL_TABLET | Freq: Every day | ORAL | 3 refills | Status: DC
Start: 2021-09-17 — End: 2022-08-03
  Filled 2021-09-17: qty 90, 90d supply, fill #0
  Filled 2021-12-22 (×2): qty 90, 90d supply, fill #1
  Filled 2022-04-15: qty 90, 90d supply, fill #2

## 2021-09-18 ENCOUNTER — Other Ambulatory Visit: Payer: Self-pay

## 2021-12-17 ENCOUNTER — Encounter: Payer: Self-pay | Admitting: Family Medicine

## 2021-12-22 ENCOUNTER — Other Ambulatory Visit: Payer: Self-pay

## 2021-12-23 ENCOUNTER — Other Ambulatory Visit: Payer: Self-pay

## 2021-12-24 ENCOUNTER — Other Ambulatory Visit: Payer: Self-pay

## 2021-12-27 MED ORDER — MEDROXYPROGESTERONE ACETATE 10 MG PO TABS
10.0000 mg | ORAL_TABLET | Freq: Every day | ORAL | 0 refills | Status: DC
  Filled 2021-12-27: qty 5, 5d supply, fill #0

## 2021-12-28 ENCOUNTER — Other Ambulatory Visit: Payer: Self-pay

## 2022-01-01 ENCOUNTER — Other Ambulatory Visit: Payer: Self-pay | Admitting: Family Medicine

## 2022-01-03 ENCOUNTER — Other Ambulatory Visit: Payer: Self-pay | Admitting: Family Medicine

## 2022-01-03 ENCOUNTER — Other Ambulatory Visit: Payer: Self-pay

## 2022-01-04 ENCOUNTER — Other Ambulatory Visit: Payer: Self-pay

## 2022-01-04 NOTE — Telephone Encounter (Signed)
Requested medication (s) are due for refill today: na  Requested medication (s) are on the active medication list: yes   Last refill:  12/27/21 #5 0 refills   Future visit scheduled: yes in 7 months   Notes to clinic:  do you want to refill more than for #5 tablets?     Requested Prescriptions  Pending Prescriptions Disp Refills   medroxyPROGESTERone (PROVERA) 10 MG tablet 5 tablet 0    Sig: Take 1 tablet (10 mg total) by mouth daily.     OB/GYN:  Progestins Passed - 01/03/2022  9:04 PM      Passed - Last BP in normal range    BP Readings from Last 1 Encounters:  07/30/21 123/77         Passed - Valid encounter within last 12 months    Recent Outpatient Visits           5 months ago Routine general medical examination at a health care facility   [facility], [place] P, DO   9 months ago Cervical disc herniation   [facility], Megan P, DO   9 months ago Radicular pain in left arm   [place], Megan P, DO   10 months ago Lateral epicondylitis of left elbow   [place], DO   1 year ago Chest pain, unspecified type   [facility], Scheryl Darter, NP       Future Appointments             In 7 months Johnson, Barb Merino, DO [place], [place] - Patient is not a smoker

## 2022-01-05 ENCOUNTER — Other Ambulatory Visit: Payer: Self-pay

## 2022-01-13 ENCOUNTER — Other Ambulatory Visit: Payer: Self-pay

## 2022-01-13 MED ORDER — MEDROXYPROGESTERONE ACETATE 10 MG PO TABS
10.0000 mg | ORAL_TABLET | Freq: Every day | ORAL | 0 refills | Status: DC
  Filled 2022-01-13: qty 10, 10d supply, fill #0

## 2022-02-05 ENCOUNTER — Other Ambulatory Visit: Payer: Self-pay

## 2022-02-05 MED ORDER — CLOMIPHENE CITRATE 50 MG PO TABS
ORAL_TABLET | ORAL | 0 refills | Status: DC
  Filled 2022-02-05: qty 5, 5d supply, fill #0

## 2022-03-09 ENCOUNTER — Ambulatory Visit (INDEPENDENT_AMBULATORY_CARE_PROVIDER_SITE_OTHER): Payer: No Typology Code available for payment source | Admitting: Physician Assistant

## 2022-03-09 ENCOUNTER — Encounter: Payer: Self-pay | Admitting: Physician Assistant

## 2022-03-09 ENCOUNTER — Other Ambulatory Visit: Payer: Self-pay

## 2022-03-09 VITALS — BP 104/71 | HR 87 | Temp 99.6°F | Wt 241.0 lb

## 2022-03-09 DIAGNOSIS — J029 Acute pharyngitis, unspecified: Secondary | ICD-10-CM

## 2022-03-09 DIAGNOSIS — J020 Streptococcal pharyngitis: Secondary | ICD-10-CM

## 2022-03-09 LAB — RAPID STREP SCREEN (MED CTR MEBANE ONLY): Strep Gp A Ag, IA W/Reflex: POSITIVE — AB

## 2022-03-09 LAB — VERITOR FLU A/B WAIVED
Influenza A: NEGATIVE
Influenza B: NEGATIVE

## 2022-03-09 MED ORDER — MEDROXYPROGESTERONE ACETATE 10 MG PO TABS
ORAL_TABLET | ORAL | 0 refills | Status: DC
  Filled 2022-03-09: qty 10, 10d supply, fill #0

## 2022-03-09 MED ORDER — AMOXICILLIN 500 MG PO CAPS
500.0000 mg | ORAL_CAPSULE | Freq: Two times a day (BID) | ORAL | 0 refills | Status: AC
  Filled 2022-03-09: qty 20, 10d supply, fill #0

## 2022-03-09 NOTE — Progress Notes (Signed)
Acute Office Visit   Patient: Cynthia George   DOB: 1985-11-05   36 y.o. Female  MRN: [id number] Visit Date: 03/09/2022  Today's healthcare provider: Oswaldo Conroy Klaire Court, PA-C  Introduced myself to the patient as a Secondary school teacher and provided education on APPs in clinical practice.    Chief Complaint  Patient presents with   Sore Throat    Patient states she's had a sore throat since yesterday. Feels like she has chills and body aches. Took COVID test and it was negative.    Subjective    HPI HPI     Sore Throat    Additional comments: Patient states she's had a sore throat since yesterday. Feels like she has chills and body aches. Took COVID test and it was negative.       Last edited by Rolley Sims, CMA on 03/09/2022 10:59 AM.       Sore throat Onset: Sudden  Duration: started yesterday  Reports sore throat, and thinks she is having some swelling in her lymph nodes Denies fever but states she had chills and body aches She denies recent sick contacts   Interventions: she took Tylenol extra-strength,  She has tested for COVID at home- negative result after symptoms started    Medications: Outpatient Medications Prior to Visit  Medication Sig   clomiPHENE (CLOMID) 50 MG tablet Take 1 tablet (50 mg total) by mouth daily. 1 tablet daily on days 3-5 of cycle   clomiPHENE (CLOMID) 50 MG tablet Take 1 tablet (50 mg total) by mouth once daily for 5 days starting on the 5th day of her cycle   levothyroxine (SYNTHROID) 100 MCG tablet TAKE 1 TABLET BY MOUTH DAILY BEFORE BREAKFAST.   medroxyPROGESTERone (PROVERA) 10 MG tablet Take 1 tablet (10 mg total) by mouth once daily for 10 days   hydrOXYzine (VISTARIL) 25 MG capsule Take 1 capsule (25 mg total) by mouth every 8 (eight) hours as needed. (Patient not taking: Reported on 03/09/2022)   No facility-administered medications prior to visit.    Review of Systems  Constitutional:  Positive for chills.  HENT:  Positive for sore  throat and trouble swallowing (due to pain). Negative for congestion, ear pain, postnasal drip, rhinorrhea, sinus pressure and sinus pain.   Respiratory:  Negative for cough, choking, shortness of breath and wheezing.   Gastrointestinal:  Negative for diarrhea, nausea and vomiting.  Musculoskeletal:  Positive for myalgias.  Neurological:  Negative for dizziness and headaches.       Objective    BP 104/71   Pulse 87   Temp 99.6 F (37.6 C)   Wt 241 lb (109.3 kg)   SpO2 96%   BMI 42.69 kg/m    Physical Exam Vitals reviewed.  Constitutional:      General: She is awake.     Appearance: Normal appearance. She is well-developed and well-groomed.  HENT:     Head: Normocephalic and atraumatic.     Right Ear: Tympanic membrane, ear canal and external ear normal.     Left Ear: Tympanic membrane is erythematous and bulging. Tympanic membrane is not injected, scarred, perforated or retracted.     Mouth/Throat:     Pharynx: Uvula midline. Posterior oropharyngeal erythema present. No pharyngeal swelling, oropharyngeal exudate or uvula swelling.     Tonsils: No tonsillar exudate or tonsillar abscesses.  Pulmonary:     Effort: Pulmonary effort is normal.     Breath sounds: Normal breath sounds.  No decreased air movement. No decreased breath sounds, wheezing, rhonchi or rales.  Musculoskeletal:     Cervical back: Normal range of motion.  Lymphadenopathy:     Head:     Right side of head: Submandibular adenopathy present. No submental, tonsillar or preauricular adenopathy.     Left side of head: Submandibular adenopathy present. No submental, tonsillar or preauricular adenopathy.     Cervical:     Right cervical: No superficial or posterior cervical adenopathy.    Left cervical: No superficial or posterior cervical adenopathy.  Neurological:     Mental Status: She is alert.  Psychiatric:        Behavior: Behavior is cooperative.       No results found for any visits on 03/09/22.   Assessment & Plan      No follow-ups on file.       Problem List Items Addressed This Visit   None Visit Diagnoses     Strep pharyngitis    -  Primary Acute, new concern Rapid strep testing was positive today in office PE revealed erythema in posterior oropharynx and exquisite tenderness to submandibular lymph nodes  Will send in script for Amoxicillin 500 mg PO BID x 10 days for management Reviewed using OTC pain medications as needed for pain and discomfort as well as warm teas, lozenges, lidocaine spray,etc Provided instructions for preventing reinfection via AVS Follow up as needed for persistent or progressing symptoms     Relevant Medications   amoxicillin (AMOXIL) 500 MG capsule   Sore throat       Relevant Orders   Rapid Strep screen([facility])   Influenza A & B (STAT)   Mononucleosis screen        No follow-ups on file.   I, Daylene Vandenbosch E Jaedan Huttner, PA-C, have reviewed all documentation for this visit. The documentation on 03/09/22 for the exam, diagnosis, procedures, and orders are all accurate and complete.   Jacquelin Hawking, MHS, PA-C [facility]

## 2022-03-09 NOTE — Patient Instructions (Signed)
Your rapid Group A Strep test came back positive / your Strep culture came back positive This indicates an active Strep Pharyngitis or strep throat infection which will need an antibiotic to resolve to prevent further complications  I have sent in a prescription for Amoxicillin 500 mg to be taken by mouth twice per day for 10 days / FINISH THE ENTIRE COURSE unless you are instructed to stop or develop an allergic reaction  Stay well hydrated, I usually recommend consuming about 75 oz or more of water and hydrating beverages per day while recovering from such an infection.   You can use over the counter Ibuprofen and Tylenol (alternating every 4 hours) as needed to assist with fever and pain/discomfort  I recommend discarding and replacing anything that you have used in your mouth in the last 72 hours prior to your symptoms - this includes your toothbrush, straws, mouth guards, etc. Unless you have a way of sanitizing them to reduce risk of reinfection   Do not share drinks or food with anyone until your antibiotic is complete  If you have further concerns or your symptoms seem like they are getting worse, please let us know

## 2022-03-11 LAB — MONONUCLEOSIS SCREEN: Mono Screen: NEGATIVE

## 2022-03-31 ENCOUNTER — Encounter: Payer: Self-pay | Admitting: Family Medicine

## 2022-03-31 ENCOUNTER — Other Ambulatory Visit: Payer: Self-pay

## 2022-03-31 ENCOUNTER — Telehealth (INDEPENDENT_AMBULATORY_CARE_PROVIDER_SITE_OTHER): Payer: No Typology Code available for payment source | Admitting: Family Medicine

## 2022-03-31 DIAGNOSIS — F419 Anxiety disorder, unspecified: Secondary | ICD-10-CM

## 2022-03-31 MED ORDER — BUSPIRONE HCL 5 MG PO TABS
5.0000 mg | ORAL_TABLET | Freq: Two times a day (BID) | ORAL | 1 refills | Status: DC
  Filled 2022-03-31: qty 60, 30d supply, fill #0

## 2022-03-31 MED ORDER — LORAZEPAM 0.5 MG PO TABS
0.5000 mg | ORAL_TABLET | Freq: Two times a day (BID) | ORAL | 1 refills | Status: DC | PRN
  Filled 2022-03-31: qty 30, 15d supply, fill #0

## 2022-03-31 NOTE — Progress Notes (Signed)
There were no vitals taken for this visit.   Subjective:    Patient ID: Cynthia George, female    DOB: 1985/10/02, 36 y.o.   MRN: [id number]  HPI: Cynthia George is a 36 y.o. female  Chief Complaint  Patient presents with   Anxiety    Patient says she would like to discuss her Anxiety and have had a lot of recent triggers.    ANXIETY/STRESS- going through a separation Duration: new onset Status:exacerbated Anxious mood: yes  Excessive worrying: yes Irritability: yes  Sweating: no Nausea: yes Palpitations:yes Hyperventilation: no Panic attacks: yes Agoraphobia: yes  Obscessions/compulsions: no Depressed mood: no    03/31/2022   10:57 AM 07/30/2021   10:16 AM 04/07/2021   10:42 AM 03/17/2021    8:57 AM 03/03/2021    3:25 PM  Depression screen PHQ 2/9  Decreased Interest 2 0 0 0 0  Down, Depressed, Hopeless 1 0 0 0 0  PHQ - 2 Score 3 0 0 0 0  Altered sleeping 0 0 0 0 0  Tired, decreased energy 0 0 0 0 0  Change in appetite 3 0 0 0 0  Feeling bad or failure about yourself  1 0 0 0 0  Trouble concentrating 0 0 0 0 0  Moving slowly or fidgety/restless 0 0 0 0 0  Suicidal thoughts 0 0 0 0 0  PHQ-9 Score 7 0 0 0 0  Difficult doing work/chores Very difficult          03/31/2022   11:00 AM 07/30/2021   10:19 AM 04/07/2021   10:42 AM 03/17/2021    8:58 AM  GAD 7 : Generalized Anxiety Score  Nervous, Anxious, on Edge 3 2 1 2   Control/stop worrying 3 3 1 2   Worry too much - different things 3 3 1 2   Trouble relaxing  1 2 3   Restless  2 0 3  Easily annoyed or irritable 3 1 1 3   Afraid - awful might happen 2 1 1 2   Total GAD 7 Score  13 7 17   Anxiety Difficulty Very difficult  Somewhat difficult Somewhat difficult   Anhedonia: no Weight changes: yes Insomnia: no   Hypersomnia: no Fatigue/loss of energy: yes Feelings of worthlessness: yes Feelings of guilt: yes Impaired concentration/indecisiveness: no Suicidal ideations: no  Crying spells: yes Recent  Stressors/Life Changes: yes   Relationship problems: yes   Family stress: yes     Financial stress: no    Job stress: no    Recent death/loss: yes   Relevant past medical, surgical, family and social history reviewed and updated as indicated. Interim medical history since our last visit reviewed. Allergies and medications reviewed and updated.  Review of Systems  Constitutional: Negative.   Respiratory: Negative.    Cardiovascular: Negative.   Gastrointestinal: Negative.   Musculoskeletal: Negative.   Skin: Negative.   Psychiatric/Behavioral:  Negative for agitation, behavioral problems, confusion, decreased concentration, dysphoric mood, hallucinations, self-injury, sleep disturbance and suicidal ideas. The patient is nervous/anxious. The patient is not hyperactive.     Per HPI unless specifically indicated above     Objective:    There were no vitals taken for this visit.  Wt Readings from Last 3 Encounters:  03/09/22 241 lb (109.3 kg)  07/30/21 237 lb (107.5 kg)  07/13/21 236 lb 4.8 oz (107.2 kg)    Physical Exam Vitals and nursing note reviewed.  Constitutional:      General: She is not in acute  distress.    Appearance: Normal appearance. She is not ill-appearing, toxic-appearing or diaphoretic.  HENT:     Head: Normocephalic and atraumatic.     Right Ear: External ear normal.     Left Ear: External ear normal.     Nose: Nose normal.     Mouth/Throat:     Mouth: Mucous membranes are moist.     Pharynx: Oropharynx is clear.  Eyes:     General: No scleral icterus.       Right eye: No discharge.        Left eye: No discharge.     Conjunctiva/sclera: Conjunctivae normal.     Pupils: Pupils are equal, round, and reactive to light.  Pulmonary:     Effort: Pulmonary effort is normal. No respiratory distress.     Comments: Speaking in full sentences Musculoskeletal:        General: Normal range of motion.     Cervical back: Normal range of motion.  Skin:     Coloration: Skin is not jaundiced or pale.     Findings: No bruising, erythema, lesion or rash.  Neurological:     Mental Status: She is alert and oriented to person, place, and time. Mental status is at baseline.  Psychiatric:        Mood and Affect: Mood normal.        Behavior: Behavior normal.        Thought Content: Thought content normal.        Judgment: Judgment normal.     Results for orders placed or performed in visit on 03/09/22  Rapid Strep screen([facility])   Specimen: Other   Other  Result Value Ref Range   Strep Gp A Ag, IA W/Reflex Positive (A) Negative  Influenza A & B (STAT)  Result Value Ref Range   Influenza A Negative Negative   Influenza B Negative Negative  Mononucleosis screen  Result Value Ref Range   Mono Screen Negative Negative      Assessment & Plan:   Problem List Items Addressed This Visit   None Visit Diagnoses     Acute anxiety    -  Primary   Going through a separtation. Having panic attacks. Not doing well. Will start buspar and give lorazepam for panic attacks. Recheck 1 month.   Relevant Medications   busPIRone (BUSPAR) 5 MG tablet   LORazepam (ATIVAN) 0.5 MG tablet        Follow up plan: Return in about 1 month (around 05/04/2022) for 4:20- son coming at at 4, OK to put her right after.   This visit was completed via video visit through MyChart due to the restrictions of the COVID-19 pandemic. All issues as above were discussed and addressed. Physical exam was done as above through visual confirmation on video through MyChart. If it was felt that the patient should be evaluated in the office, they were directed there. The patient verbally consented to this visit. Location of the patient: home Location of the provider: work Those involved with this call:  Provider: Olevia Perches, DO CMA: Malen Gauze, CMA Front Desk/Registration: [employer]  Time spent on call:  15 minutes with patient face to face via video  conference. More than 50% of this time was spent in counseling and coordination of care. 23 minutes total spent in review of patient's record and preparation of their chart.

## 2022-03-31 NOTE — Progress Notes (Signed)
Scheduled patient for 4:20 pm on 05/04/2022.

## 2022-03-31 NOTE — Telephone Encounter (Signed)
Can you see if she can do a virtual today? I have an opening

## 2022-04-05 ENCOUNTER — Telehealth: Payer: No Typology Code available for payment source | Admitting: Family Medicine

## 2022-05-04 ENCOUNTER — Other Ambulatory Visit: Payer: Self-pay

## 2022-05-04 ENCOUNTER — Ambulatory Visit (INDEPENDENT_AMBULATORY_CARE_PROVIDER_SITE_OTHER): Payer: 59 | Admitting: Family Medicine

## 2022-05-04 ENCOUNTER — Encounter: Payer: Self-pay | Admitting: Family Medicine

## 2022-05-04 VITALS — BP 125/78 | HR 59 | Temp 98.3°F | Ht 63.0 in | Wt 225.4 lb

## 2022-05-04 DIAGNOSIS — F419 Anxiety disorder, unspecified: Secondary | ICD-10-CM | POA: Diagnosis not present

## 2022-05-04 DIAGNOSIS — E039 Hypothyroidism, unspecified: Secondary | ICD-10-CM

## 2022-05-04 DIAGNOSIS — F325 Major depressive disorder, single episode, in full remission: Secondary | ICD-10-CM

## 2022-05-04 MED ORDER — ESCITALOPRAM OXALATE 5 MG PO TABS
ORAL_TABLET | ORAL | 3 refills | Status: DC
  Filled 2022-05-04: qty 30, 30d supply, fill #0

## 2022-05-04 NOTE — Progress Notes (Signed)
BP 125/78   Pulse (!) 59   Temp 98.3 F (36.8 C) (Oral)   Ht 5\' 3"  (1.6 m)   Wt 225 lb 6.4 oz (102.2 kg)   SpO2 99%   BMI 39.93 kg/m    Subjective:    Patient ID: Cynthia George, female    DOB: [birth year], 37 y.o.   MRN: [id number]  HPI: Cynthia George is a 37 y.o. female  Chief Complaint  Patient presents with   Anxiety   ANXIETY/STRESS Duration: about a month and a half Status:uncontrolled Anxious mood: yes  Excessive worrying: yes Irritability: yes  Sweating: no Nausea: no Palpitations:no Hyperventilation: no Panic attacks: yes Agoraphobia: no  Obscessions/compulsions: no Depressed mood: yes    05/04/2022    4:08 PM 03/31/2022   10:57 AM 07/30/2021   10:16 AM 04/07/2021   10:42 AM 03/17/2021    8:57 AM  Depression screen PHQ 2/9  Decreased Interest 1 2 0 0 0  Down, Depressed, Hopeless 0 1 0 0 0  PHQ - 2 Score 1 3 0 0 0  Altered sleeping 1 0 0 0 0  Tired, decreased energy 1 0 0 0 0  Change in appetite 1 3 0 0 0  Feeling bad or failure about yourself  1 1 0 0 0  Trouble concentrating 1 0 0 0 0  Moving slowly or fidgety/restless 2 0 0 0 0  Suicidal thoughts 0 0 0 0 0  PHQ-9 Score 8 7 0 0 0  Difficult doing work/chores Somewhat difficult Very difficult      Anhedonia: no Weight changes: yes Insomnia: no   Hypersomnia: no Fatigue/loss of energy: yes Feelings of worthlessness: yes Feelings of guilt: yes Impaired concentration/indecisiveness: yes Suicidal ideations: no  Crying spells: yes Recent Stressors/Life Changes: yes   Relationship problems: yes   Family stress: yes     Financial stress: no    Job stress: no    Recent death/loss: yes  Relevant past medical, surgical, family and social history reviewed and updated as indicated. Interim medical history since our last visit reviewed. Allergies and medications reviewed and updated.  Review of Systems  Constitutional: Negative.   Respiratory: Negative.    Cardiovascular: Negative.    Gastrointestinal: Negative.   Musculoskeletal: Negative.   Psychiatric/Behavioral:  Positive for dysphoric mood. Negative for agitation, behavioral problems, confusion, decreased concentration, hallucinations, self-injury, sleep disturbance and suicidal ideas. The patient is nervous/anxious. The patient is not hyperactive.     Per HPI unless specifically indicated above     Objective:    BP 125/78   Pulse (!) 59   Temp 98.3 F (36.8 C) (Oral)   Ht 5\' 3"  (1.6 m)   Wt 225 lb 6.4 oz (102.2 kg)   SpO2 99%   BMI 39.93 kg/m   Wt Readings from Last 3 Encounters:  05/04/22 225 lb 6.4 oz (102.2 kg)  03/09/22 241 lb (109.3 kg)  07/30/21 237 lb (107.5 kg)    Physical Exam Vitals and nursing note reviewed.  Constitutional:      General: She is not in acute distress.    Appearance: Normal appearance. She is not ill-appearing, toxic-appearing or diaphoretic.  HENT:     Head: Normocephalic and atraumatic.     Right Ear: External ear normal.     Left Ear: External ear normal.     Nose: Nose normal.     Mouth/Throat:     Mouth: Mucous membranes are moist.     Pharynx:  Oropharynx is clear.  Eyes:     General: No scleral icterus.       Right eye: No discharge.        Left eye: No discharge.     Extraocular Movements: Extraocular movements intact.     Conjunctiva/sclera: Conjunctivae normal.     Pupils: Pupils are equal, round, and reactive to light.  Cardiovascular:     Rate and Rhythm: Normal rate and regular rhythm.     Pulses: Normal pulses.     Heart sounds: Normal heart sounds. No murmur heard.    No friction rub. No gallop.  Pulmonary:     Effort: Pulmonary effort is normal. No respiratory distress.     Breath sounds: Normal breath sounds. No stridor. No wheezing, rhonchi or rales.  Chest:     Chest wall: No tenderness.  Musculoskeletal:        General: Normal range of motion.     Cervical back: Normal range of motion and neck supple.  Skin:    General: Skin is warm and  dry.     Capillary Refill: Capillary refill takes less than 2 seconds.     Coloration: Skin is not jaundiced or pale.     Findings: No bruising, erythema, lesion or rash.  Neurological:     General: No focal deficit present.     Mental Status: She is alert and oriented to person, place, and time. Mental status is at baseline.  Psychiatric:        Mood and Affect: Mood normal.        Behavior: Behavior normal.        Thought Content: Thought content normal.        Judgment: Judgment normal.     Results for orders placed or performed in visit on 03/09/22  Rapid Strep screen([facility])   Specimen: Other   Other  Result Value Ref Range   Strep Gp A Ag, IA W/Reflex Positive (A) Negative  Influenza A & B (STAT)  Result Value Ref Range   Influenza A Negative Negative   Influenza B Negative Negative  Mononucleosis screen  Result Value Ref Range   Mono Screen Negative Negative      Assessment & Plan:   Problem List Items Addressed This Visit       Endocrine   Hypothyroid    Will recheck labs today. Await results. Treat as needed.       Relevant Orders   TSH     Other   Major depression, single episode, in complete remission ([place]) - Primary    Acting up again with her separation. Will start her on lexapro. Recheck 1 month. Call with any concerns.       Relevant Medications   escitalopram (LEXAPRO) 5 MG tablet   Other Visit Diagnoses     Acute anxiety       Acting up again with her separation. Will start her on lexapro. Recheck 1 month. Call with any concerns.   Relevant Medications   escitalopram (LEXAPRO) 5 MG tablet        Follow up plan: Return in about 4 weeks (around 06/01/2022) for virtual OK.

## 2022-05-04 NOTE — Assessment & Plan Note (Signed)
Will recheck labs today. Await results. Treat as needed.

## 2022-05-04 NOTE — Assessment & Plan Note (Signed)
Acting up again with her separation. Will start her on lexapro. Recheck 1 month. Call with any concerns.

## 2022-05-05 LAB — TSH: TSH: 1.46 u[IU]/mL (ref 0.450–4.500)

## 2022-05-12 ENCOUNTER — Other Ambulatory Visit: Payer: Self-pay

## 2022-05-12 MED ORDER — MEDROXYPROGESTERONE ACETATE 10 MG PO TABS
10.0000 mg | ORAL_TABLET | Freq: Every day | ORAL | 0 refills | Status: DC
  Filled 2022-05-12: qty 10, 10d supply, fill #0

## 2022-05-13 ENCOUNTER — Other Ambulatory Visit: Payer: Self-pay

## 2022-06-04 ENCOUNTER — Telehealth: Payer: 59 | Admitting: Family Medicine

## 2022-06-11 ENCOUNTER — Telehealth (INDEPENDENT_AMBULATORY_CARE_PROVIDER_SITE_OTHER): Payer: 59 | Admitting: Family Medicine

## 2022-06-11 ENCOUNTER — Encounter: Payer: Self-pay | Admitting: Family Medicine

## 2022-06-11 DIAGNOSIS — F325 Major depressive disorder, single episode, in full remission: Secondary | ICD-10-CM

## 2022-06-11 NOTE — Assessment & Plan Note (Signed)
Doing much better! Not on any medicine right now. Feeling well. Will call with any concerns. Continue to monitor.

## 2022-06-11 NOTE — Progress Notes (Signed)
LMP 06/02/2022 (Exact Date)    Subjective:    Patient ID: Cynthia George, female    DOB: [birth year], 37 y.o.   MRN: [id number]  HPI: Cynthia George is a 37 y.o. female  Chief Complaint  Patient presents with   Anxiety   Depression   ANXIETY/STRESS- did not take her lexapro, things have been better, so she didn't feel like she needed it.  Duration: weeks Status:better Anxious mood: yes  Excessive worrying: no Irritability: no  Sweating: no Nausea: no Palpitations:no Hyperventilation: no Panic attacks: no Agoraphobia: no  Obscessions/compulsions: no Depressed mood: no    06/11/2022   11:09 AM 05/04/2022    4:08 PM 03/31/2022   10:57 AM 07/30/2021   10:16 AM 04/07/2021   10:42 AM  Depression screen PHQ 2/9  Decreased Interest 0 1 2 0 0  Down, Depressed, Hopeless 0 0 1 0 0  PHQ - 2 Score 0 1 3 0 0  Altered sleeping 0 1 0 0 0  Tired, decreased energy 0 1 0 0 0  Change in appetite 0 1 3 0 0  Feeling bad or failure about yourself  0 1 1 0 0  Trouble concentrating 0 1 0 0 0  Moving slowly or fidgety/restless 0 2 0 0 0  Suicidal thoughts 0 0 0 0 0  PHQ-9 Score 0 8 7 0 0  Difficult doing work/chores Not difficult at all Somewhat difficult Very difficult     Anhedonia: no Weight changes: no Insomnia: no   Hypersomnia: no Fatigue/loss of energy: no Feelings of worthlessness: no Feelings of guilt: no Impaired concentration/indecisiveness: no Suicidal ideations: no  Crying spells: no Recent Stressors/Life Changes: yes   Relationship problems: yes   Family stress: yes     Financial stress: no    Job stress: yes    Recent death/loss: no  Relevant past medical, surgical, family and social history reviewed and updated as indicated. Interim medical history since our last visit reviewed. Allergies and medications reviewed and updated.  Review of Systems  Constitutional: Negative.   Respiratory: Negative.    Cardiovascular: Negative.   Musculoskeletal: Negative.    Neurological: Negative.   Psychiatric/Behavioral: Negative.      Per HPI unless specifically indicated above     Objective:    LMP 06/02/2022 (Exact Date)   Wt Readings from Last 3 Encounters:  05/04/22 225 lb 6.4 oz (102.2 kg)  03/09/22 241 lb (109.3 kg)  07/30/21 237 lb (107.5 kg)    Physical Exam Vitals and nursing note reviewed.  Constitutional:      General: She is not in acute distress.    Appearance: Normal appearance. She is not ill-appearing, toxic-appearing or diaphoretic.  HENT:     Head: Normocephalic and atraumatic.     Right Ear: External ear normal.     Left Ear: External ear normal.     Nose: Nose normal.     Mouth/Throat:     Mouth: Mucous membranes are moist.     Pharynx: Oropharynx is clear.  Eyes:     General: No scleral icterus.       Right eye: No discharge.        Left eye: No discharge.     Conjunctiva/sclera: Conjunctivae normal.     Pupils: Pupils are equal, round, and reactive to light.  Pulmonary:     Effort: Pulmonary effort is normal. No respiratory distress.     Comments: Speaking in full sentences Musculoskeletal:  General: Normal range of motion.     Cervical back: Normal range of motion.  Skin:    Coloration: Skin is not jaundiced or pale.     Findings: No bruising, erythema, lesion or rash.  Neurological:     Mental Status: She is alert and oriented to person, place, and time. Mental status is at baseline.  Psychiatric:        Mood and Affect: Mood normal.        Behavior: Behavior normal.        Thought Content: Thought content normal.        Judgment: Judgment normal.     Results for orders placed or performed in visit on 05/04/22  TSH  Result Value Ref Range   TSH 1.460 0.450 - 4.500 uIU/mL      Assessment & Plan:   Problem List Items Addressed This Visit       Other   Major depression, single episode, in complete remission ([place]) - Primary    Doing much better! Not on any medicine right now. Feeling well.  Will call with any concerns. Continue to monitor.         Follow up plan: Return As scheduled.   This visit was completed via video visit through MyChart due to the restrictions of the COVID-19 pandemic. All issues as above were discussed and addressed. Physical exam was done as above through visual confirmation on video through MyChart. If it was felt that the patient should be evaluated in the office, they were directed there. The patient verbally consented to this visit. Location of the patient: home Location of the provider: work Those involved with this call:  Provider: Park Liter, DO CMA: Yvonna Alanis, [place] Desk/Registration: [employer]  Time spent on call:  15 minutes with patient face to face via video conference. More than 50% of this time was spent in counseling and coordination of care. 23 minutes total spent in review of patient's record and preparation of their chart.

## 2022-06-28 ENCOUNTER — Telehealth: Payer: 59 | Admitting: Physician Assistant

## 2022-06-28 ENCOUNTER — Other Ambulatory Visit: Payer: Self-pay

## 2022-06-28 DIAGNOSIS — B3731 Acute candidiasis of vulva and vagina: Secondary | ICD-10-CM | POA: Diagnosis not present

## 2022-06-28 MED ORDER — FLUCONAZOLE 150 MG PO TABS
150.0000 mg | ORAL_TABLET | ORAL | 0 refills | Status: DC | PRN
  Filled 2022-06-28: qty 2, 6d supply, fill #0

## 2022-06-28 NOTE — Progress Notes (Signed)

## 2022-08-02 ENCOUNTER — Ambulatory Visit (INDEPENDENT_AMBULATORY_CARE_PROVIDER_SITE_OTHER): Payer: 59 | Admitting: Family Medicine

## 2022-08-02 ENCOUNTER — Encounter: Payer: Self-pay | Admitting: Family Medicine

## 2022-08-02 VITALS — BP 114/72 | HR 64 | Temp 98.4°F | Ht 62.0 in | Wt 223.3 lb

## 2022-08-02 DIAGNOSIS — E039 Hypothyroidism, unspecified: Secondary | ICD-10-CM | POA: Diagnosis not present

## 2022-08-02 DIAGNOSIS — F325 Major depressive disorder, single episode, in full remission: Secondary | ICD-10-CM | POA: Diagnosis not present

## 2022-08-02 DIAGNOSIS — Z0000 Encounter for general adult medical examination without abnormal findings: Secondary | ICD-10-CM | POA: Diagnosis not present

## 2022-08-02 LAB — URINALYSIS, ROUTINE W REFLEX MICROSCOPIC
Bilirubin, UA: NEGATIVE
Glucose, UA: NEGATIVE
Ketones, UA: NEGATIVE
Leukocytes,UA: NEGATIVE
Nitrite, UA: NEGATIVE
Protein,UA: NEGATIVE
RBC, UA: NEGATIVE
Specific Gravity, UA: 1.02 (ref 1.005–1.030)
Urobilinogen, Ur: 0.2 mg/dL (ref 0.2–1.0)
pH, UA: 5.5 (ref 5.0–7.5)

## 2022-08-02 NOTE — Assessment & Plan Note (Signed)
Rechecking labs today. Await results. Treat as needed.  °

## 2022-08-02 NOTE — Assessment & Plan Note (Signed)
Congratulated patient on weight loss- much due to stress, so she will monitor. Call with any concerns. Continue to monitor.

## 2022-08-02 NOTE — Progress Notes (Signed)
BP 114/72   Pulse 64   Temp 98.4 F (36.9 C) (Oral)   Ht  (1.575 m)   Wt 223 lb 4.8 oz (101.3 kg)   LMP 07/08/2022 (Exact Date)   SpO2 99%   BMI 40.84 kg/m    Subjective:    Patient ID: Cynthia George, female    DOB: 06/09/1985, 37 y.o.   MRN: [id number]  HPI: Cynthia George is a 37 y.o. female presenting on 08/02/2022 for comprehensive medical examination. Current medical complaints include:  HYPOTHYROIDISM Thyroid control status:stable Satisfied with current treatment? yes Medication side effects: no Medication compliance: excellent compliance Etiology of hypothyroidism:  Recent dose adjustment:no Fatigue: yes Cold intolerance: yes Heat intolerance: no Weight gain: no Weight loss: no Constipation: no Diarrhea/loose stools: no Palpitations: no Lower extremity edema: no Anxiety/depressed mood: no  DEPRESSION Mood status:  fluctuating Satisfied with current treatment?: yes Symptom severity: moderate  Duration of current treatment : not on anything Side effects: N/A Medication compliance: not on anything Psychotherapy/counseling: yes current Previous psychiatric medications: none Depressed mood: no Anxious mood: no Anhedonia: no Significant weight loss or gain: no Insomnia: no  Fatigue: yes Feelings of worthlessness or guilt: no Impaired concentration/indecisiveness: yes Suicidal ideations: no Hopelessness: no Crying spells: no    08/02/2022    9:04 AM 06/11/2022   11:09 AM 05/04/2022    4:08 PM 03/31/2022   10:57 AM 07/30/2021   10:16 AM  Depression screen PHQ 2/9  Decreased Interest 1 0 1 2 0  Down, Depressed, Hopeless 1 0 0 1 0  PHQ - 2 Score 2 0 1 3 0  Altered sleeping 1 0 1 0 0  Tired, decreased energy 1 0 1 0 0  Change in appetite 1 0 1 3 0  Feeling bad or failure about yourself  1 0 1 1 0  Trouble concentrating 2 0 1 0 0  Moving slowly or fidgety/restless 2 0 2 0 0  Suicidal thoughts 0 0 0 0 0  PHQ-9 Score 10 0 8 7 0  Difficult doing  work/chores Somewhat difficult Not difficult at all Somewhat difficult Very difficult     She currently lives with: son Menopausal Symptoms: no  Depression Screen done today and results listed below:     08/02/2022    9:04 AM 06/11/2022   11:09 AM 05/04/2022    4:08 PM 03/31/2022   10:57 AM 07/30/2021   10:16 AM  Depression screen PHQ 2/9  Decreased Interest 1 0 1 2 0  Down, Depressed, Hopeless 1 0 0 1 0  PHQ - 2 Score 2 0 1 3 0  Altered sleeping 1 0 1 0 0  Tired, decreased energy 1 0 1 0 0  Change in appetite 1 0 1 3 0  Feeling bad or failure about yourself  1 0 1 1 0  Trouble concentrating 2 0 1 0 0  Moving slowly or fidgety/restless 2 0 2 0 0  Suicidal thoughts 0 0 0 0 0  PHQ-9 Score 10 0 8 7 0  Difficult doing work/chores Somewhat difficult Not difficult at all Somewhat difficult Very difficult     Past Medical History:  Past Medical History:  Diagnosis Date   Anxiety    Depression    Hyperlipidemia    Migraines    Obesity    PCOS (polycystic ovarian syndrome)     Surgical History:  Past Surgical History:  Procedure Laterality Date   APPENDECTOMY  2013  HUMERUS FRACTURE SURGERY Left 2002,2003   rod and screws placed and removed   TONSILLECTOMY AND ADENOIDECTOMY  2004   WISDOM TOOTH EXTRACTION  2011   All    Medications:  Current Outpatient Medications on File Prior to Visit  Medication Sig   levothyroxine (SYNTHROID) 100 MCG tablet TAKE 1 TABLET BY MOUTH DAILY BEFORE BREAKFAST.   medroxyPROGESTERone (PROVERA) 10 MG tablet Take 1 tablet (10 mg total) by mouth daily.   escitalopram (LEXAPRO) 5 MG tablet Take 0.5 tablets (2.5 mg total) by mouth daily for 7 days, THEN 1 tablet (5 mg total) daily   No current facility-administered medications on file prior to visit.    Allergies:  Allergies  Allergen Reactions   Flagyl [Metronidazole] Rash   Keflex [Cephalexin] Rash   Septra [Sulfamethoxazole-Trimethoprim] Rash    Social History:  Social History    Socioeconomic History   Marital status: Married    Spouse name: Not on file   Number of children: Not on file   Years of education: Not on file   Highest education level: Not on file  Occupational History   Not on file  Tobacco Use   Smoking status: Never   Smokeless tobacco: Never  Vaping Use   Vaping Use: Never used  Substance and Sexual Activity   Alcohol use: No   Drug use: No   Sexual activity: Yes    Birth control/protection: None  Other Topics Concern   Not on file  Social History Narrative   Not on file   Social Determinants of Health   Financial Resource Strain: Not on file  Food Insecurity: Not on file  Transportation Needs: Not on file  Physical Activity: Not on file  Stress: Not on file  Social Connections: Not on file  Intimate Partner Violence: Not on file   Social History   Tobacco Use  Smoking Status Never  Smokeless Tobacco Never   Social History   Substance and Sexual Activity  Alcohol Use No    Family History:  Family History  Problem Relation Age of Onset   Heart disease Mother    Hypertension Mother    Hyperlipidemia Mother    Heart attack Mother    Hyperlipidemia Father    Hypertension Father    Diabetes Father    Asthma Father    Heart attack Father 40   Hyperlipidemia Sister    Asthma Sister    Polycystic ovary syndrome Sister    Mental illness Sister        Depression   Mental illness Brother        Depression   Diabetes Maternal Grandmother    Hyperlipidemia Maternal Grandmother    Hypertension Maternal Grandmother    Diabetes Maternal Grandfather    Hypertension Maternal Grandfather    Heart attack Maternal Grandfather    Diabetes Paternal Grandmother    Heart attack Paternal Grandfather     Past medical history, surgical history, medications, allergies, family history and social history reviewed with patient today and changes made to appropriate areas of the chart.   Review of Systems  Constitutional:  Negative.   HENT: Negative.    Eyes: Negative.   Respiratory: Negative.    Cardiovascular: Negative.   Gastrointestinal: Negative.   Genitourinary: Negative.   Musculoskeletal: Negative.   Skin: Negative.   Neurological: Negative.   Endo/Heme/Allergies: Negative.   Psychiatric/Behavioral: Negative.     All other ROS negative except what is listed above and in the HPI.  Objective:    BP 114/72   Pulse 64   Temp 98.4 F (36.9 C) (Oral)   Ht 5\' 2"  (1.575 m)   Wt 223 lb 4.8 oz (101.3 kg)   LMP 07/08/2022 (Exact Date)   SpO2 99%   BMI 40.84 kg/m   Wt Readings from Last 3 Encounters:  08/02/22 223 lb 4.8 oz (101.3 kg)  05/04/22 225 lb 6.4 oz (102.2 kg)  03/09/22 241 lb (109.3 kg)    Physical Exam Vitals and nursing note reviewed.  Constitutional:      General: She is not in acute distress.    Appearance: Normal appearance. She is obese. She is not ill-appearing, toxic-appearing or diaphoretic.  HENT:     Head: Normocephalic and atraumatic.     Right Ear: Tympanic membrane, ear canal and external ear normal. There is no impacted cerumen.     Left Ear: Tympanic membrane, ear canal and external ear normal. There is no impacted cerumen.     Nose: Nose normal. No congestion or rhinorrhea.     Mouth/Throat:     Mouth: Mucous membranes are moist.     Pharynx: Oropharynx is clear. No oropharyngeal exudate or posterior oropharyngeal erythema.  Eyes:     General: No scleral icterus.       Right eye: No discharge.        Left eye: No discharge.     Extraocular Movements: Extraocular movements intact.     Conjunctiva/sclera: Conjunctivae normal.     Pupils: Pupils are equal, round, and reactive to light.  Neck:     Vascular: No carotid bruit.  Cardiovascular:     Rate and Rhythm: Normal rate and regular rhythm.     Pulses: Normal pulses.     Heart sounds: No murmur heard.    No friction rub. No gallop.  Pulmonary:     Effort: Pulmonary effort is normal. No respiratory  distress.     Breath sounds: Normal breath sounds. No stridor. No wheezing, rhonchi or rales.  Chest:     Chest wall: No tenderness.  Abdominal:     General: Abdomen is flat. Bowel sounds are normal. There is no distension.     Palpations: Abdomen is soft. There is no mass.     Tenderness: There is no abdominal tenderness. There is no right CVA tenderness, left CVA tenderness, guarding or rebound.     Hernia: No hernia is present.  Genitourinary:    Comments: Breast and pelvic exams deferred with shared decision making Musculoskeletal:        General: No swelling, tenderness, deformity or signs of injury.     Cervical back: Normal range of motion and neck supple. No rigidity. No muscular tenderness.     Right lower leg: No edema.     Left lower leg: No edema.  Lymphadenopathy:     Cervical: No cervical adenopathy.  Skin:    General: Skin is warm and dry.     Capillary Refill: Capillary refill takes less than 2 seconds.     Coloration: Skin is not jaundiced or pale.     Findings: No bruising, erythema, lesion or rash.  Neurological:     General: No focal deficit present.     Mental Status: She is alert and oriented to person, place, and time. Mental status is at baseline.     Cranial Nerves: No cranial nerve deficit.     Sensory: No sensory deficit.     Motor: No weakness.  Coordination: Coordination normal.     Gait: Gait normal.     Deep Tendon Reflexes: Reflexes normal.  Psychiatric:        Mood and Affect: Mood normal.        Behavior: Behavior normal.        Thought Content: Thought content normal.        Judgment: Judgment normal.     Results for orders placed or performed in visit on 05/04/22  TSH  Result Value Ref Range   TSH 1.460 0.450 - 4.500 uIU/mL      Assessment & Plan:   Problem List Items Addressed This Visit       Endocrine   Hypothyroid    Rechecking labs today. Await results. Treat as needed.         Other   Morbid obesity     Congratulated patient on weight loss- much due to stress, so she will monitor. Call with any concerns. Continue to monitor.       Major depression, single episode, in complete remission    Does not want to start medicine right now. She does have it if she changes her mind. Continue to monitor.       Other Visit Diagnoses     Routine general medical examination at a health care facility    -  Primary   Vaccines up to date. Screening labs checked today. Pap through GYN. Continue diet and exercise. Call with any concerns.   Relevant Orders   CBC with Differential/Platelet   Comprehensive metabolic panel   Lipid Panel w/o Chol/HDL Ratio   Urinalysis, Routine w reflex microscopic   TSH        Follow up plan: Return in about 6 months (around 02/01/2023).   LABORATORY TESTING:  - Pap smear: done elsewhere  IMMUNIZATIONS:   - Tdap: Tetanus vaccination status reviewed: last tetanus booster within 10 years. - Influenza: Up to date - Pneumovax: Not applicable - Prevnar: Not applicable - COVID: Up to date - HPV: Refused  PATIENT COUNSELING:   Advised to take 1 mg of folate supplement per day if capable of pregnancy.   Sexuality: Discussed sexually transmitted diseases, partner selection, use of condoms, avoidance of unintended pregnancy  and contraceptive alternatives.   Advised to avoid cigarette smoking.  I discussed with the patient that most people either abstain from alcohol or drink within safe limits (<=14/week and <=4 drinks/occasion for males, <=7/weeks and <= 3 drinks/occasion for females) and that the risk for alcohol disorders and other health effects rises proportionally with the number of drinks per week and how often a drinker exceeds daily limits.  Discussed cessation/primary prevention of drug use and availability of treatment for abuse.   Diet: Encouraged to adjust caloric intake to maintain  or achieve ideal body weight, to reduce intake of dietary saturated fat  and total fat, to limit sodium intake by avoiding high sodium foods and not adding table salt, and to maintain adequate dietary potassium and calcium preferably from fresh fruits, vegetables, and low-fat dairy products.    stressed the importance of regular exercise  Injury prevention: Discussed safety belts, safety helmets, smoke detector, smoking near bedding or upholstery.   Dental health: Discussed importance of regular tooth brushing, flossing, and dental visits.    NEXT PREVENTATIVE PHYSICAL DUE IN 1 YEAR. Return in about 6 months (around 02/01/2023).

## 2022-08-02 NOTE — Assessment & Plan Note (Signed)
Does not want to start medicine right now. She does have it if she changes her mind. Continue to monitor.

## 2022-08-03 ENCOUNTER — Other Ambulatory Visit: Payer: Self-pay | Admitting: Family Medicine

## 2022-08-03 ENCOUNTER — Other Ambulatory Visit: Payer: Self-pay

## 2022-08-03 DIAGNOSIS — E039 Hypothyroidism, unspecified: Secondary | ICD-10-CM

## 2022-08-03 LAB — COMPREHENSIVE METABOLIC PANEL
ALT: 16 IU/L (ref 0–32)
AST: 12 IU/L (ref 0–40)
Albumin/Globulin Ratio: 1.5 (ref 1.2–2.2)
Albumin: 4.4 g/dL (ref 3.9–4.9)
Alkaline Phosphatase: 56 IU/L (ref 44–121)
BUN/Creatinine Ratio: 22 (ref 9–23)
BUN: 15 mg/dL (ref 6–20)
Bilirubin Total: 0.8 mg/dL (ref 0.0–1.2)
CO2: 22 mmol/L (ref 20–29)
Calcium: 9.3 mg/dL (ref 8.7–10.2)
Chloride: 102 mmol/L (ref 96–106)
Creatinine, Ser: 0.67 mg/dL (ref 0.57–1.00)
Globulin, Total: 2.9 g/dL (ref 1.5–4.5)
Glucose: 82 mg/dL (ref 70–99)
Potassium: 4.3 mmol/L (ref 3.5–5.2)
Sodium: 137 mmol/L (ref 134–144)
Total Protein: 7.3 g/dL (ref 6.0–8.5)
eGFR: 116 mL/min/{1.73_m2} (ref >59)

## 2022-08-03 LAB — CBC WITH DIFFERENTIAL/PLATELET
Basophils Absolute: 0.1 10*3/uL (ref 0.0–0.2)
Basos: 1 %
EOS (ABSOLUTE): 0.3 10*3/uL (ref 0.0–0.4)
Eos: 3 %
Hematocrit: 40.6 % (ref 34.0–46.6)
Hemoglobin: 13 g/dL (ref 11.1–15.9)
Immature Grans (Abs): 0 10*3/uL (ref 0.0–0.1)
Immature Granulocytes: 0 %
Lymphocytes Absolute: 1.7 10*3/uL (ref 0.7–3.1)
Lymphs: 20 %
MCH: 28.3 pg (ref 26.6–33.0)
MCHC: 32 g/dL (ref 31.5–35.7)
MCV: 89 fL (ref 79–97)
Monocytes Absolute: 0.7 10*3/uL (ref 0.1–0.9)
Monocytes: 8 %
Neutrophils Absolute: 5.7 10*3/uL (ref 1.4–7.0)
Neutrophils: 68 %
Platelets: 300 10*3/uL (ref 150–450)
RBC: 4.59 x10E6/uL (ref 3.77–5.28)
RDW: 13.1 % (ref 11.7–15.4)
WBC: 8.5 10*3/uL (ref 3.4–10.8)

## 2022-08-03 LAB — LIPID PANEL W/O CHOL/HDL RATIO
Cholesterol, Total: 210 mg/dL — ABNORMAL HIGH (ref 100–199)
HDL: 40 mg/dL (ref >39)
LDL Chol Calc (NIH): 137 mg/dL — ABNORMAL HIGH (ref 0–99)
Triglycerides: 181 mg/dL — ABNORMAL HIGH (ref 0–149)
VLDL Cholesterol Cal: 33 mg/dL (ref 5–40)

## 2022-08-03 LAB — TSH: TSH: 1.76 u[IU]/mL (ref 0.450–4.500)

## 2022-08-03 MED ORDER — LEVOTHYROXINE SODIUM 100 MCG PO TABS
ORAL_TABLET | Freq: Every day | ORAL | 3 refills | Status: DC
  Filled 2022-08-03: qty 90, 90d supply, fill #0
  Filled 2022-12-21: qty 90, 90d supply, fill #1
  Filled 2023-05-16: qty 90, 90d supply, fill #2

## 2023-01-20 ENCOUNTER — Other Ambulatory Visit: Payer: Self-pay

## 2023-01-20 DIAGNOSIS — E66812 Obesity, class 2: Secondary | ICD-10-CM | POA: Diagnosis not present

## 2023-01-20 DIAGNOSIS — Z1339 Encounter for screening examination for other mental health and behavioral disorders: Secondary | ICD-10-CM | POA: Diagnosis not present

## 2023-01-20 DIAGNOSIS — Z124 Encounter for screening for malignant neoplasm of cervix: Secondary | ICD-10-CM | POA: Diagnosis not present

## 2023-01-20 DIAGNOSIS — Z1331 Encounter for screening for depression: Secondary | ICD-10-CM | POA: Diagnosis not present

## 2023-01-20 DIAGNOSIS — Z01419 Encounter for gynecological examination (general) (routine) without abnormal findings: Secondary | ICD-10-CM | POA: Diagnosis not present

## 2023-01-20 DIAGNOSIS — Z6839 Body mass index (BMI) 39.0-39.9, adult: Secondary | ICD-10-CM | POA: Diagnosis not present

## 2023-01-20 DIAGNOSIS — E6609 Other obesity due to excess calories: Secondary | ICD-10-CM | POA: Diagnosis not present

## 2023-01-20 DIAGNOSIS — R87610 Atypical squamous cells of undetermined significance on cytologic smear of cervix (ASC-US): Secondary | ICD-10-CM | POA: Diagnosis not present

## 2023-01-20 DIAGNOSIS — Z01411 Encounter for gynecological examination (general) (routine) with abnormal findings: Secondary | ICD-10-CM | POA: Diagnosis not present

## 2023-01-20 MED ORDER — PHENTERMINE HCL 37.5 MG PO TABS
37.5000 mg | ORAL_TABLET | Freq: Every morning | ORAL | 0 refills | Status: DC
  Filled 2023-01-20: qty 30, 30d supply, fill #0

## 2023-02-03 ENCOUNTER — Ambulatory Visit: Payer: 59 | Admitting: Family Medicine

## 2023-02-03 ENCOUNTER — Encounter: Payer: Self-pay | Admitting: Family Medicine

## 2023-02-03 VITALS — BP 123/79 | HR 61 | Ht 62.0 in | Wt 210.4 lb

## 2023-02-03 DIAGNOSIS — E039 Hypothyroidism, unspecified: Secondary | ICD-10-CM | POA: Diagnosis not present

## 2023-02-03 DIAGNOSIS — F325 Major depressive disorder, single episode, in full remission: Secondary | ICD-10-CM | POA: Diagnosis not present

## 2023-02-03 NOTE — Assessment & Plan Note (Signed)
Rechecking labs today. Await results. Treat as needed.  °

## 2023-02-03 NOTE — Assessment & Plan Note (Signed)
Doing well. Not on medicine. Continue to monitor. Call with any concerns.

## 2023-02-03 NOTE — Progress Notes (Signed)
BP 123/79   Pulse 61   Ht 5\' 2"  (1.575 m)   Wt 210 lb 6.4 oz (95.4 kg)   LMP 01/27/2023 (Exact Date)   SpO2 99%   BMI 38.48 kg/m    Subjective:    Patient ID: Cynthia George, female    DOB: 07-24-1985, 37 y.o.   MRN: [id number]  HPI: Cynthia George is a 37 y.o. female  Chief Complaint  Patient presents with   Depression   Hypothyroidism   Flu Vaccine    Patient offered flu vaccine. Patient says she will have it performed at her job.    HYPOTHYROIDISM Thyroid control status:stable Satisfied with current treatment? yes Medication side effects: no Medication compliance: excellent compliance Etiology of hypothyroidism:  Recent dose adjustment:no Fatigue: no Cold intolerance: yes Heat intolerance: no Weight gain: no Weight loss: yes Constipation: no Diarrhea/loose stools: no Palpitations: no Lower extremity edema: no Anxiety/depressed mood: no  DEPRESSION Mood status: controlled Satisfied with current treatment?: yes Symptom severity: mild  Duration of current treatment : not on anything Psychotherapy/counseling: yes  Previous psychiatric medications: none Depressed mood: no Anxious mood: no Anhedonia: no Significant weight loss or gain: no Insomnia: no  Fatigue: no Feelings of worthlessness or guilt: no Impaired concentration/indecisiveness: no Suicidal ideations: no Hopelessness: no Crying spells: no    02/03/2023    8:35 AM 08/02/2022    9:04 AM 06/11/2022   11:09 AM 05/04/2022    4:08 PM 03/31/2022   10:57 AM  Depression screen PHQ 2/9  Decreased Interest 0 1 0 1 2  Down, Depressed, Hopeless 0 1 0 0 1  PHQ - 2 Score 0 2 0 1 3  Altered sleeping 0 1 0 1 0  Tired, decreased energy 0 1 0 1 0  Change in appetite 0 1 0 1 3  Feeling bad or failure about yourself  0 1 0 1 1  Trouble concentrating 0 2 0 1 0  Moving slowly or fidgety/restless 0 2 0 2 0  Suicidal thoughts 0 0 0 0 0  PHQ-9 Score 0 10 0 8 7  Difficult doing work/chores Not difficult at  all Somewhat difficult Not difficult at all Somewhat difficult Very difficult    Relevant past medical, surgical, family and social history reviewed and updated as indicated. Interim medical history since our last visit reviewed. Allergies and medications reviewed and updated.  Review of Systems  Constitutional: Negative.   Respiratory: Negative.    Cardiovascular: Negative.   Gastrointestinal: Negative.   Musculoskeletal: Negative.   Neurological: Negative.   Psychiatric/Behavioral: Negative.      Per HPI unless specifically indicated above     Objective:    BP 123/79   Pulse 61   Ht 5\' 2"  (1.575 m)   Wt 210 lb 6.4 oz (95.4 kg)   LMP 01/27/2023 (Exact Date)   SpO2 99%   BMI 38.48 kg/m   Wt Readings from Last 3 Encounters:  02/03/23 210 lb 6.4 oz (95.4 kg)  08/02/22 223 lb 4.8 oz (101.3 kg)  05/04/22 225 lb 6.4 oz (102.2 kg)    Physical Exam Vitals and nursing note reviewed.  Constitutional:      General: She is not in acute distress.    Appearance: Normal appearance. She is not ill-appearing, toxic-appearing or diaphoretic.  HENT:     Head: Normocephalic and atraumatic.     Right Ear: External ear normal.     Left Ear: External ear normal.  Nose: Nose normal.     Mouth/Throat:     Mouth: Mucous membranes are moist.     Pharynx: Oropharynx is clear.  Eyes:     General: No scleral icterus.       Right eye: No discharge.        Left eye: No discharge.     Extraocular Movements: Extraocular movements intact.     Conjunctiva/sclera: Conjunctivae normal.     Pupils: Pupils are equal, round, and reactive to light.  Cardiovascular:     Rate and Rhythm: Normal rate and regular rhythm.     Pulses: Normal pulses.     Heart sounds: Normal heart sounds. No murmur heard.    No friction rub. No gallop.  Pulmonary:     Effort: Pulmonary effort is normal. No respiratory distress.     Breath sounds: Normal breath sounds. No stridor. No wheezing, rhonchi or rales.   Chest:     Chest wall: No tenderness.  Musculoskeletal:        General: Normal range of motion.     Cervical back: Normal range of motion and neck supple.  Skin:    General: Skin is warm and dry.     Capillary Refill: Capillary refill takes less than 2 seconds.     Coloration: Skin is not jaundiced or pale.     Findings: No bruising, erythema, lesion or rash.  Neurological:     General: No focal deficit present.     Mental Status: She is alert and oriented to person, place, and time. Mental status is at baseline.  Psychiatric:        Mood and Affect: Mood normal.        Behavior: Behavior normal.        Thought Content: Thought content normal.        Judgment: Judgment normal.     Results for orders placed or performed in visit on 08/02/22  CBC with Differential/Platelet  Result Value Ref Range   WBC 8.5 3.4 - 10.8 x10E3/uL   RBC 4.59 3.77 - 5.28 x10E6/uL   Hemoglobin 13.0 11.1 - 15.9 g/dL   Hematocrit 60.1 09.3 - 46.6 %   MCV 89 79 - 97 fL   MCH 28.3 26.6 - 33.0 pg   MCHC 32.0 31.5 - 35.7 g/dL   RDW 23.5 57.3 - 22.0 %   Platelets 300 150 - 450 x10E3/uL   Neutrophils 68 Not Estab. %   Lymphs 20 Not Estab. %   Monocytes 8 Not Estab. %   Eos 3 Not Estab. %   Basos 1 Not Estab. %   Neutrophils Absolute 5.7 1.4 - 7.0 x10E3/uL   Lymphocytes Absolute 1.7 0.7 - 3.1 x10E3/uL   Monocytes Absolute 0.7 0.1 - 0.9 x10E3/uL   EOS (ABSOLUTE) 0.3 0.0 - 0.4 x10E3/uL   Basophils Absolute 0.1 0.0 - 0.2 x10E3/uL   Immature Granulocytes 0 Not Estab. %   Immature Grans (Abs) 0.0 0.0 - 0.1 x10E3/uL  Comprehensive metabolic panel  Result Value Ref Range   Glucose 82 70 - 99 mg/dL   BUN 15 6 - 20 mg/dL   Creatinine, Ser 2.54 0.57 - 1.00 mg/dL   eGFR 270 >62 BJ/SEG/3.15   BUN/Creatinine Ratio 22 9 - 23   Sodium 137 134 - 144 mmol/L   Potassium 4.3 3.5 - 5.2 mmol/L   Chloride 102 96 - 106 mmol/L   CO2 22 20 - 29 mmol/L   Calcium 9.3 8.7 - 10.2 mg/dL  Total Protein 7.3 6.0 - 8.5 g/dL    Albumin 4.4 3.9 - 4.9 g/dL   Globulin, Total 2.9 1.5 - 4.5 g/dL   Albumin/Globulin Ratio 1.5 1.2 - 2.2   Bilirubin Total 0.8 0.0 - 1.2 mg/dL   Alkaline Phosphatase 56 44 - 121 IU/L   AST 12 0 - 40 IU/L   ALT 16 0 - 32 IU/L  Lipid Panel w/o Chol/HDL Ratio  Result Value Ref Range   Cholesterol, Total 210 (H) 100 - 199 mg/dL   Triglycerides 578 (H) 0 - 149 mg/dL   HDL 40 >46 mg/dL   VLDL Cholesterol Cal 33 5 - 40 mg/dL   LDL Chol Calc (NIH) 962 (H) 0 - 99 mg/dL  Urinalysis, Routine w reflex microscopic  Result Value Ref Range   Specific Gravity, UA 1.020 1.005 - 1.030   pH, UA 5.5 5.0 - 7.5   Color, UA Yellow Yellow   Appearance Ur Cloudy (A) Clear   Leukocytes,UA Negative Negative   Protein,UA Negative Negative/Trace   Glucose, UA Negative Negative   Ketones, UA Negative Negative   RBC, UA Negative Negative   Bilirubin, UA Negative Negative   Urobilinogen, Ur 0.2 0.2 - 1.0 mg/dL   Nitrite, UA Negative Negative   Microscopic Examination Comment   TSH  Result Value Ref Range   TSH 1.760 0.450 - 4.500 uIU/mL      Assessment & Plan:   Problem List Items Addressed This Visit       Endocrine   Hypothyroid - Primary    Rechecking labs today. Await results. Treat as needed.       Relevant Orders   TSH     Other   Major depression, single episode, in complete remission (HCC)    Doing well. Not on medicine. Continue to monitor. Call with any concerns.         Follow up plan: Return in about 6 months (around 08/04/2023) for physical.

## 2023-02-05 LAB — TSH: TSH: 0.565 u[IU]/mL (ref 0.450–4.500)

## 2023-02-18 ENCOUNTER — Other Ambulatory Visit: Payer: Self-pay

## 2023-02-20 ENCOUNTER — Other Ambulatory Visit: Payer: Self-pay

## 2023-02-21 ENCOUNTER — Other Ambulatory Visit: Payer: Self-pay

## 2023-02-22 ENCOUNTER — Other Ambulatory Visit: Payer: Self-pay

## 2023-02-24 ENCOUNTER — Other Ambulatory Visit: Payer: Self-pay

## 2023-02-24 MED ORDER — PHENTERMINE HCL 37.5 MG PO TABS
37.5000 mg | ORAL_TABLET | Freq: Every day | ORAL | 0 refills | Status: DC
  Filled 2023-02-24: qty 30, 30d supply, fill #0

## 2023-03-29 ENCOUNTER — Other Ambulatory Visit: Payer: Self-pay

## 2023-03-29 MED ORDER — CLOMIPHENE CITRATE 50 MG PO TABS
50.0000 mg | ORAL_TABLET | Freq: Every day | ORAL | 0 refills | Status: DC
  Filled 2023-03-29: qty 5, 5d supply, fill #0

## 2023-03-31 ENCOUNTER — Other Ambulatory Visit: Payer: Self-pay

## 2023-04-19 DIAGNOSIS — N970 Female infertility associated with anovulation: Secondary | ICD-10-CM | POA: Diagnosis not present

## 2023-05-16 ENCOUNTER — Other Ambulatory Visit: Payer: Self-pay

## 2023-06-23 ENCOUNTER — Telehealth: Payer: Self-pay | Admitting: Family Medicine

## 2023-06-23 ENCOUNTER — Other Ambulatory Visit: Payer: Self-pay

## 2023-06-23 MED ORDER — OSELTAMIVIR PHOSPHATE 75 MG PO CAPS
75.0000 mg | ORAL_CAPSULE | Freq: Every day | ORAL | 0 refills | Status: DC
  Filled 2023-06-23: qty 10, 10d supply, fill #0

## 2023-06-23 NOTE — Telephone Encounter (Signed)
 Son tested positive for Flu A today. Prophylactic tamiflu sent to pharmacy

## 2023-07-01 ENCOUNTER — Other Ambulatory Visit: Payer: Self-pay

## 2023-07-01 MED ORDER — CLOMIPHENE CITRATE 50 MG PO TABS
ORAL_TABLET | ORAL | 0 refills | Status: DC
  Filled 2023-07-01: qty 5, 30d supply, fill #0

## 2023-07-04 ENCOUNTER — Other Ambulatory Visit: Payer: Self-pay

## 2023-07-22 DIAGNOSIS — N970 Female infertility associated with anovulation: Secondary | ICD-10-CM | POA: Diagnosis not present

## 2023-09-16 ENCOUNTER — Other Ambulatory Visit: Payer: Self-pay

## 2023-09-16 ENCOUNTER — Other Ambulatory Visit: Payer: Self-pay | Admitting: Family Medicine

## 2023-09-16 ENCOUNTER — Encounter: Payer: Self-pay | Admitting: Family Medicine

## 2023-09-16 ENCOUNTER — Ambulatory Visit (INDEPENDENT_AMBULATORY_CARE_PROVIDER_SITE_OTHER): Admitting: Family Medicine

## 2023-09-16 VITALS — BP 130/88 | HR 62 | Temp 98.8°F | Ht 62.0 in | Wt 215.6 lb

## 2023-09-16 DIAGNOSIS — E039 Hypothyroidism, unspecified: Secondary | ICD-10-CM

## 2023-09-16 DIAGNOSIS — Z833 Family history of diabetes mellitus: Secondary | ICD-10-CM

## 2023-09-16 DIAGNOSIS — Z0000 Encounter for general adult medical examination without abnormal findings: Secondary | ICD-10-CM

## 2023-09-16 LAB — BAYER DCA HB A1C WAIVED: HB A1C (BAYER DCA - WAIVED): 5.1 % (ref 4.8–5.6)

## 2023-09-16 NOTE — Progress Notes (Signed)
 BP 130/88 (BP Location: Left Arm, Patient Position: Sitting, Cuff Size: Large)   Pulse 62   Temp 98.8 F (37.1 C) (Oral)   Ht 5\' 2"  (1.575 m)   Wt 215 lb 9.6 oz (97.8 kg)   LMP 09/02/2023 (Exact Date)   SpO2 98%   BMI 39.43 kg/m    Subjective:    Patient ID: Cynthia George, female    DOB: 12/07/1985, 38 y.o.   MRN: [id number]  HPI: ARIS MOMAN is a 38 y.o. female presenting on 09/16/2023 for comprehensive medical examination. Current medical complaints include:  HYPOTHYROIDISM Thyroid  control status:controlled Satisfied with current treatment? yes Medication side effects: no Medication compliance: excellent compliance Recent dose adjustment:no Fatigue: no Cold intolerance: no Heat intolerance: no Weight gain: no Weight loss: no Constipation: no Diarrhea/loose stools: no Palpitations: no Lower extremity edema: no Anxiety/depressed mood: no  She currently lives with: son Menopausal Symptoms: no  Depression Screen done today and results listed below:     09/16/2023    8:38 AM 02/03/2023    8:35 AM 08/02/2022    9:04 AM 06/11/2022   11:09 AM 05/04/2022    4:08 PM  Depression screen PHQ 2/9  Decreased Interest 0 0 1 0 1  Down, Depressed, Hopeless 0 0 1 0 0  PHQ - 2 Score 0 0 2 0 1  Altered sleeping 0 0 1 0 1  Tired, decreased energy 0 0 1 0 1  Change in appetite 0 0 1 0 1  Feeling bad or failure about yourself  0 0 1 0 1  Trouble concentrating 0 0 2 0 1  Moving slowly or fidgety/restless 0 0 2 0 2  Suicidal thoughts 0 0 0 0 0  PHQ-9 Score 0 0 10 0 8  Difficult doing work/chores  Not difficult at all Somewhat difficult Not difficult at all Somewhat difficult    Past Medical History:  Past Medical History:  Diagnosis Date   Anxiety    Depression    Hyperlipidemia    Migraines    Obesity    PCOS (polycystic ovarian syndrome)     Surgical History:  Past Surgical History:  Procedure Laterality Date   APPENDECTOMY  2013   HUMERUS FRACTURE SURGERY Left  2002,2003   rod and screws placed and removed   TONSILLECTOMY AND ADENOIDECTOMY  2004   WISDOM TOOTH EXTRACTION  2011   All    Medications:  Current Outpatient Medications on File Prior to Visit  Medication Sig   clomiPHENE  (CLOMID ) 50 MG tablet Take 1 tablet (50 mg total) by mouth once daily for 5 days starting on the 5th day of her cycle   levothyroxine  (SYNTHROID ) 100 MCG tablet TAKE 1 TABLET BY MOUTH DAILY BEFORE BREAKFAST.   No current facility-administered medications on file prior to visit.    Allergies:  Allergies  Allergen Reactions   Flagyl [Metronidazole] Rash   Keflex [Cephalexin] Rash   Septra [Sulfamethoxazole-Trimethoprim] Rash    Social History:  Social History   Socioeconomic History   Marital status: Married    Spouse name: Not on file   Number of children: Not on file   Years of education: Not on file   Highest education level: Not on file  Occupational History   Not on file  Tobacco Use   Smoking status: Never   Smokeless tobacco: Never  Vaping Use   Vaping status: Never Used  Substance and Sexual Activity   Alcohol use: No   Drug  use: No   Sexual activity: Yes    Birth control/protection: None  Other Topics Concern   Not on file  Social History Narrative   Not on file   Social Drivers of Health   Financial Resource Strain: Low Risk  (09/16/2023)   Overall Financial Resource Strain (CARDIA)    Difficulty of Paying Living Expenses: Not hard at all  Food Insecurity: No Food Insecurity (09/16/2023)   Hunger Vital Sign    Worried About Running Out of Food in the Last Year: Never true    Ran Out of Food in the Last Year: Never true  Transportation Needs: No Transportation Needs (09/16/2023)   PRAPARE - Administrator, Civil Service (Medical): No    Lack of Transportation (Non-Medical): No  Physical Activity: Sufficiently Active (09/16/2023)   Exercise Vital Sign    Days of Exercise per Week: 7 days    Minutes of Exercise per  Session: 30 min  Stress: No Stress Concern Present (09/16/2023)   [employer] of Occupational Health - Occupational Stress Questionnaire    Feeling of Stress : Not at all  Social Connections: Moderately Integrated (09/16/2023)   Social Connection and Isolation Panel [NHANES]    Frequency of Communication with Friends and Family: More than three times a week    Frequency of Social Gatherings with Friends and Family: Twice a week    Attends Religious Services: Never    Database administrator or Organizations: Yes    Attends Engineer, structural: More than 4 times per year    Marital Status: Married  Catering manager Violence: Not At Risk (09/16/2023)   Humiliation, Afraid, Rape, and Kick questionnaire    Fear of Current or Ex-Partner: No    Emotionally Abused: No    Physically Abused: No    Sexually Abused: No   Social History   Tobacco Use  Smoking Status Never  Smokeless Tobacco Never   Social History   Substance and Sexual Activity  Alcohol Use No    Family History:  Family History  Problem Relation Age of Onset   Heart disease Mother    Hypertension Mother    Hyperlipidemia Mother    Heart attack Mother    Hyperlipidemia Father    Hypertension Father    Diabetes Father    Asthma Father    Heart attack Father 66   Hyperlipidemia Sister    Asthma Sister    Polycystic ovary syndrome Sister    Mental illness Sister        Depression   Mental illness Brother        Depression   Diabetes Maternal Grandmother    Hyperlipidemia Maternal Grandmother    Hypertension Maternal Grandmother    Diabetes Maternal Grandfather    Hypertension Maternal Grandfather    Heart attack Maternal Grandfather    Diabetes Paternal Grandmother    Heart attack Paternal Grandfather     Past medical history, surgical history, medications, allergies, family history and social history reviewed with patient today and changes made to appropriate areas of the chart.   Review of  Systems  Constitutional: Negative.   HENT: Negative.    Eyes: Negative.   Respiratory: Negative.    Cardiovascular: Negative.   Gastrointestinal: Negative.   Genitourinary: Negative.   Musculoskeletal: Negative.   Skin: Negative.   Neurological: Negative.   Endo/Heme/Allergies: Negative.   Psychiatric/Behavioral: Negative.     All other ROS negative except what is listed above and in  the HPI.      Objective:     BP 130/88 (BP Location: Left Arm, Patient Position: Sitting, Cuff Size: Large)   Pulse 62   Temp 98.8 F (37.1 C) (Oral)   Ht 5\' 2"  (1.575 m)   Wt 215 lb 9.6 oz (97.8 kg)   LMP 09/02/2023 (Exact Date)   SpO2 98%   BMI 39.43 kg/m   Wt Readings from Last 3 Encounters:  09/16/23 215 lb 9.6 oz (97.8 kg)  02/03/23 210 lb 6.4 oz (95.4 kg)  08/02/22 223 lb 4.8 oz (101.3 kg)    Physical Exam Vitals and nursing note reviewed.  Constitutional:      General: She is not in acute distress.    Appearance: Normal appearance. She is not ill-appearing, toxic-appearing or diaphoretic.  HENT:     Head: Normocephalic and atraumatic.     Right Ear: Tympanic membrane, ear canal and external ear normal. There is no impacted cerumen.     Left Ear: Tympanic membrane, ear canal and external ear normal. There is no impacted cerumen.     Nose: Nose normal. No congestion or rhinorrhea.     Mouth/Throat:     Mouth: Mucous membranes are moist.     Pharynx: Oropharynx is clear. No oropharyngeal exudate or posterior oropharyngeal erythema.  Eyes:     General: No scleral icterus.       Right eye: No discharge.        Left eye: No discharge.     Extraocular Movements: Extraocular movements intact.     Conjunctiva/sclera: Conjunctivae normal.     Pupils: Pupils are equal, round, and reactive to light.  Neck:     Vascular: No carotid bruit.  Cardiovascular:     Rate and Rhythm: Normal rate and regular rhythm.     Pulses: Normal pulses.     Heart sounds: No murmur heard.    No friction  rub. No gallop.  Pulmonary:     Effort: Pulmonary effort is normal. No respiratory distress.     Breath sounds: Normal breath sounds. No stridor. No wheezing, rhonchi or rales.  Chest:     Chest wall: No tenderness.  Abdominal:     General: Abdomen is flat. Bowel sounds are normal. There is no distension.     Palpations: Abdomen is soft. There is no mass.     Tenderness: There is no abdominal tenderness. There is no right CVA tenderness, left CVA tenderness, guarding or rebound.     Hernia: No hernia is present.  Genitourinary:    Comments: Breast and pelvic exams deferred with shared decision making Musculoskeletal:        General: No swelling, tenderness, deformity or signs of injury.     Cervical back: Normal range of motion and neck supple. No rigidity. No muscular tenderness.     Right lower leg: No edema.     Left lower leg: No edema.  Lymphadenopathy:     Cervical: No cervical adenopathy.  Skin:    General: Skin is warm and dry.     Capillary Refill: Capillary refill takes less than 2 seconds.     Coloration: Skin is not jaundiced or pale.     Findings: No bruising, erythema, lesion or rash.  Neurological:     General: No focal deficit present.     Mental Status: She is alert and oriented to person, place, and time. Mental status is at baseline.     Cranial Nerves: No cranial nerve deficit.  Sensory: No sensory deficit.     Motor: No weakness.     Coordination: Coordination normal.     Gait: Gait normal.     Deep Tendon Reflexes: Reflexes normal.  Psychiatric:        Mood and Affect: Mood normal.        Behavior: Behavior normal.        Thought Content: Thought content normal.        Judgment: Judgment normal.     Results for orders placed or performed in visit on 02/03/23  TSH   Collection Time: 02/03/23  8:52 AM  Result Value Ref Range   TSH 0.565 0.450 - 4.500 uIU/mL      Assessment & Plan:   Problem List Items Addressed This Visit       Endocrine    Hypothyroid   Rechecking labs today. Await results. Treat as needed.       Other Visit Diagnoses       Routine general medical examination at a health care facility    -  Primary   Vaccines up to date. Screening labs checked today. Pap up to date. Continue diet and exercise. Call with any concerns.   Relevant Orders   CBC with Differential/Platelet   Comprehensive metabolic panel with GFR   Lipid Panel w/o Chol/HDL Ratio   TSH     Hypothyroidism (acquired)         Family history of diabetes mellitus (DM)       A1c drawn today.   Relevant Orders   Bayer DCA Hb A1c Waived        Follow up plan: Return in about 1 year (around 09/15/2024) for physical.   LABORATORY TESTING:  - Pap smear: up to date  IMMUNIZATIONS:   - Tdap: Tetanus vaccination status reviewed: last tetanus booster within 10 years. - Influenza: Up to date - Pneumovax: Not applicable - Prevnar: Not applicable - COVID: Refused - HPV: Not applicable - Shingrix vaccine: Not applicable  PATIENT COUNSELING:   Advised to take 1 mg of folate supplement per day if capable of pregnancy.   Sexuality: Discussed sexually transmitted diseases, partner selection, use of condoms, avoidance of unintended pregnancy  and contraceptive alternatives.   Advised to avoid cigarette smoking.  I discussed with the patient that most people either abstain from alcohol or drink within safe limits (<=14/week and <=4 drinks/occasion for males, <=7/weeks and <= 3 drinks/occasion for females) and that the risk for alcohol disorders and other health effects rises proportionally with the number of drinks per week and how often a drinker exceeds daily limits.  Discussed cessation/primary prevention of drug use and availability of treatment for abuse.   Diet: Encouraged to adjust caloric intake to maintain  or achieve ideal body weight, to reduce intake of dietary saturated fat and total fat, to limit sodium intake by avoiding high sodium  foods and not adding table salt, and to maintain adequate dietary potassium and calcium preferably from fresh fruits, vegetables, and low-fat dairy products.    stressed the importance of regular exercise  Injury prevention: Discussed safety belts, safety helmets, smoke detector, smoking near bedding or upholstery.   Dental health: Discussed importance of regular tooth brushing, flossing, and dental visits.    NEXT PREVENTATIVE PHYSICAL DUE IN 1 YEAR. Return in about 1 year (around 09/15/2024) for physical.

## 2023-09-16 NOTE — Assessment & Plan Note (Signed)
 Rechecking labs today. Await results. Treat as needed.

## 2023-09-17 LAB — CBC WITH DIFFERENTIAL/PLATELET
Basophils Absolute: 0.1 10*3/uL (ref 0.0–0.2)
Basos: 2 %
EOS (ABSOLUTE): 0.3 10*3/uL (ref 0.0–0.4)
Eos: 4 %
Hematocrit: 41 % (ref 34.0–46.6)
Hemoglobin: 13.3 g/dL (ref 11.1–15.9)
Immature Grans (Abs): 0 10*3/uL (ref 0.0–0.1)
Immature Granulocytes: 0 %
Lymphocytes Absolute: 1.6 10*3/uL (ref 0.7–3.1)
Lymphs: 24 %
MCH: 29.1 pg (ref 26.6–33.0)
MCHC: 32.4 g/dL (ref 31.5–35.7)
MCV: 90 fL (ref 79–97)
Monocytes Absolute: 0.5 10*3/uL (ref 0.1–0.9)
Monocytes: 9 %
Neutrophils Absolute: 3.9 10*3/uL (ref 1.4–7.0)
Neutrophils: 61 %
Platelets: 293 10*3/uL (ref 150–450)
RBC: 4.57 x10E6/uL (ref 3.77–5.28)
RDW: 13 % (ref 11.7–15.4)
WBC: 6.4 10*3/uL (ref 3.4–10.8)

## 2023-09-17 LAB — COMPREHENSIVE METABOLIC PANEL WITH GFR
ALT: 13 IU/L (ref 0–32)
AST: 11 IU/L (ref 0–40)
Albumin: 4.4 g/dL (ref 3.9–4.9)
Alkaline Phosphatase: 55 IU/L (ref 44–121)
BUN/Creatinine Ratio: 15 (ref 9–23)
BUN: 11 mg/dL (ref 6–20)
Bilirubin Total: 0.7 mg/dL (ref 0.0–1.2)
CO2: 22 mmol/L (ref 20–29)
Calcium: 9.2 mg/dL (ref 8.7–10.2)
Chloride: 102 mmol/L (ref 96–106)
Creatinine, Ser: 0.71 mg/dL (ref 0.57–1.00)
Globulin, Total: 2.5 g/dL (ref 1.5–4.5)
Glucose: 87 mg/dL (ref 70–99)
Potassium: 4.3 mmol/L (ref 3.5–5.2)
Sodium: 139 mmol/L (ref 134–144)
Total Protein: 6.9 g/dL (ref 6.0–8.5)
eGFR: 112 mL/min/{1.73_m2} (ref >59)

## 2023-09-17 LAB — LIPID PANEL W/O CHOL/HDL RATIO
Cholesterol, Total: 222 mg/dL — ABNORMAL HIGH (ref 100–199)
HDL: 30 mg/dL — ABNORMAL LOW (ref >39)
LDL Chol Calc (NIH): 150 mg/dL — ABNORMAL HIGH (ref 0–99)
Triglycerides: 226 mg/dL — ABNORMAL HIGH (ref 0–149)
VLDL Cholesterol Cal: 42 mg/dL — ABNORMAL HIGH (ref 5–40)

## 2023-09-17 LAB — TSH: TSH: 2.11 u[IU]/mL (ref 0.450–4.500)

## 2023-09-19 ENCOUNTER — Other Ambulatory Visit: Payer: Self-pay

## 2023-09-19 NOTE — Telephone Encounter (Signed)
 Requested medications are due for refill today.  yes  Requested medications are on the active medications list.  yes  Last refill. 08/03/2022 #90 3 rf  Future visit scheduled.   yes  Notes to clinic.  Rx was written to expire 08/15/2023 - rx is expired.    Requested Prescriptions  Pending Prescriptions Disp Refills   levothyroxine  (SYNTHROID ) 100 MCG tablet 90 tablet 3    Sig: TAKE 1 TABLET BY MOUTH DAILY BEFORE BREAKFAST.     Endocrinology:  Hypothyroid Agents Failed - 09/19/2023 11:18 AM      Failed - Valid encounter within last 12 months    Recent Outpatient Visits           3 days ago Routine general medical examination at a health care facility   [facility], [place] P, DO       Future Appointments             In 12 months Lincoln Renshaw, Jerilee Montane, DO [facility]            Passed - TSH in normal range and within 360 days    TSH  Date Value Ref Range Status  09/16/2023 2.110 0.450 - 4.500 uIU/mL Final

## 2023-09-20 ENCOUNTER — Other Ambulatory Visit: Payer: Self-pay

## 2023-09-21 ENCOUNTER — Other Ambulatory Visit: Payer: Self-pay

## 2023-09-22 ENCOUNTER — Other Ambulatory Visit: Payer: Self-pay

## 2023-09-22 ENCOUNTER — Telehealth: Payer: Self-pay | Admitting: Family Medicine

## 2023-09-22 MED FILL — Levothyroxine Sodium Tab 100 MCG: ORAL | 90 days supply | Qty: 90 | Fill #0 | Status: AC

## 2023-09-23 ENCOUNTER — Other Ambulatory Visit: Payer: Self-pay

## 2023-09-26 ENCOUNTER — Ambulatory Visit: Payer: Self-pay | Admitting: Family Medicine

## 2023-09-27 NOTE — Telephone Encounter (Signed)
 error

## 2023-10-18 ENCOUNTER — Encounter: Admitting: Family Medicine

## 2023-12-30 ENCOUNTER — Ambulatory Visit: Admitting: Family Medicine

## 2023-12-30 ENCOUNTER — Other Ambulatory Visit: Payer: Self-pay

## 2023-12-30 VITALS — BP 132/84 | HR 97 | Temp 98.6°F | Ht 62.0 in | Wt 216.0 lb

## 2023-12-30 DIAGNOSIS — G43709 Chronic migraine without aura, not intractable, without status migrainosus: Secondary | ICD-10-CM

## 2023-12-30 MED ORDER — RIZATRIPTAN BENZOATE 10 MG PO TABS
10.0000 mg | ORAL_TABLET | ORAL | 12 refills | Status: AC | PRN
  Filled 2023-12-30: qty 10, 30d supply, fill #0
  Filled 2024-03-01: qty 10, 30d supply, fill #1

## 2023-12-30 MED ORDER — ONDANSETRON 4 MG PO TBDP
4.0000 mg | ORAL_TABLET | Freq: Three times a day (TID) | ORAL | 6 refills | Status: AC | PRN
  Filled 2023-12-30: qty 20, 7d supply, fill #0
  Filled 2024-03-01: qty 20, 7d supply, fill #1

## 2023-12-30 MED FILL — Levothyroxine Sodium Tab 100 MCG: ORAL | 90 days supply | Qty: 90 | Fill #1 | Status: AC

## 2023-12-30 NOTE — Progress Notes (Signed)
 BP 132/84 (BP Location: Left Arm, Patient Position: Sitting, Cuff Size: Large)   Pulse 97   Temp 98.6 F (37 C) (Oral)   Ht 5' 2 (1.575 m)   Wt 216 lb (98 kg)   SpO2 97%   BMI 39.51 kg/m    Subjective:    Patient ID: Dwayne JONETTA Jubilee, female    DOB: 1985/05/13, 38 y.o.   MRN: [id number]  HPI: Cynthia George is a 38 y.o. female  Chief Complaint  Patient presents with   Migraine    Onset about a month ago. Occurring about every day for the last 4 weeks. OTC meds no relief. Lasting all day and waking up from sleep with them. Hx of migraines in childhood. Is seeing eye dr in 2 weeks.    MIGRAINES- had migraines as a kid and as a teenager, got better- having a couple of times a year got a lot worse in the past month, now happening almost daily Onset: sudden Severity: moderate Quality: pressure, sharp, throbbing Frequency: daily Location: behind her eyes Headache duration: all day for a couple of days Radiation: no Time of day headache occurs: at random Alleviating factors: nothing Aggravating factors: nothing Headache status at time of visit: current headache Treatments attempted: tylenol , ibuprofen , aleve, imitrex as a kid   Aura: no Nausea:  yes Vomiting: no Photophobia:  yes Phonophobia:  no Effect on social functioning:  yes Numbers of missed days of school/work each month: 0 Confusion:  no Gait disturbance/ataxia:  no Behavioral changes:  no Fevers:  no  Relevant past medical, surgical, family and social history reviewed and updated as indicated. Interim medical history since our last visit reviewed. Allergies and medications reviewed and updated.  Review of Systems  Constitutional: Negative.   Respiratory: Negative.    Cardiovascular: Negative.   Neurological:  Positive for headaches. Negative for dizziness, tremors, seizures, syncope, facial asymmetry, speech difficulty, weakness, light-headedness and numbness.  Psychiatric/Behavioral: Negative.      Per  HPI unless specifically indicated above     Objective:    BP 132/84 (BP Location: Left Arm, Patient Position: Sitting, Cuff Size: Large)   Pulse 97   Temp 98.6 F (37 C) (Oral)   Ht 5' 2 (1.575 m)   Wt 216 lb (98 kg)   SpO2 97%   BMI 39.51 kg/m   Wt Readings from Last 3 Encounters:  12/30/23 216 lb (98 kg)  09/16/23 215 lb 9.6 oz (97.8 kg)  02/03/23 210 lb 6.4 oz (95.4 kg)    Physical Exam Vitals and nursing note reviewed.  Constitutional:      General: She is not in acute distress.    Appearance: Normal appearance. She is obese. She is not ill-appearing, toxic-appearing or diaphoretic.  HENT:     Head: Normocephalic and atraumatic.     Right Ear: External ear normal.     Left Ear: External ear normal.     Nose: Nose normal.     Mouth/Throat:     Mouth: Mucous membranes are moist.     Pharynx: Oropharynx is clear.  Eyes:     General: No scleral icterus.       Right eye: No discharge.        Left eye: No discharge.     Extraocular Movements: Extraocular movements intact.     Conjunctiva/sclera: Conjunctivae normal.     Pupils: Pupils are equal, round, and reactive to light.  Cardiovascular:     Rate and Rhythm: Normal  rate and regular rhythm.     Pulses: Normal pulses.     Heart sounds: Normal heart sounds. No murmur heard.    No friction rub. No gallop.  Pulmonary:     Effort: Pulmonary effort is normal. No respiratory distress.     Breath sounds: Normal breath sounds. No stridor. No wheezing, rhonchi or rales.  Chest:     Chest wall: No tenderness.  Musculoskeletal:        General: Normal range of motion.     Cervical back: Normal range of motion and neck supple.  Skin:    General: Skin is warm and dry.     Capillary Refill: Capillary refill takes less than 2 seconds.     Coloration: Skin is not jaundiced or pale.     Findings: No bruising, erythema, lesion or rash.  Neurological:     General: No focal deficit present.     Mental Status: She is alert and  oriented to person, place, and time. Mental status is at baseline.  Psychiatric:        Mood and Affect: Mood normal.        Behavior: Behavior normal.        Thought Content: Thought content normal.        Judgment: Judgment normal.     Results for orders placed or performed in visit on 09/16/23  CBC with Differential/Platelet   Collection Time: 09/16/23  8:19 AM  Result Value Ref Range   WBC 6.4 3.4 - 10.8 x10E3/uL   RBC 4.57 3.77 - 5.28 x10E6/uL   Hemoglobin 13.3 11.1 - 15.9 g/dL   Hematocrit 58.9 65.9 - 46.6 %   MCV 90 79 - 97 fL   MCH 29.1 26.6 - 33.0 pg   MCHC 32.4 31.5 - 35.7 g/dL   RDW 86.9 88.2 - 84.5 %   Platelets 293 150 - 450 x10E3/uL   Neutrophils 61 Not Estab. %   Lymphs 24 Not Estab. %   Monocytes 9 Not Estab. %   Eos 4 Not Estab. %   Basos 2 Not Estab. %   Neutrophils Absolute 3.9 1.4 - 7.0 x10E3/uL   Lymphocytes Absolute 1.6 0.7 - 3.1 x10E3/uL   Monocytes Absolute 0.5 0.1 - 0.9 x10E3/uL   EOS (ABSOLUTE) 0.3 0.0 - 0.4 x10E3/uL   Basophils Absolute 0.1 0.0 - 0.2 x10E3/uL   Immature Granulocytes 0 Not Estab. %   Immature Grans (Abs) 0.0 0.0 - 0.1 x10E3/uL  Comprehensive metabolic panel with GFR   Collection Time: 09/16/23  8:19 AM  Result Value Ref Range   Glucose 87 70 - 99 mg/dL   BUN 11 6 - 20 mg/dL   Creatinine, Ser 9.28 0.57 - 1.00 mg/dL   eGFR 887 >40 fO/fpw/8.26   BUN/Creatinine Ratio 15 9 - 23   Sodium 139 134 - 144 mmol/L   Potassium 4.3 3.5 - 5.2 mmol/L   Chloride 102 96 - 106 mmol/L   CO2 22 20 - 29 mmol/L   Calcium 9.2 8.7 - 10.2 mg/dL   Total Protein 6.9 6.0 - 8.5 g/dL   Albumin 4.4 3.9 - 4.9 g/dL   Globulin, Total 2.5 1.5 - 4.5 g/dL   Bilirubin Total 0.7 0.0 - 1.2 mg/dL   Alkaline Phosphatase 55 44 - 121 IU/L   AST 11 0 - 40 IU/L   ALT 13 0 - 32 IU/L  Lipid Panel w/o Chol/HDL Ratio   Collection Time: 09/16/23  8:19 AM  Result Value Ref  Range   Cholesterol, Total 222 (H) 100 - 199 mg/dL   Triglycerides 773 (H) 0 - 149 mg/dL   HDL  30 (L) >60 mg/dL   VLDL Cholesterol Cal 42 (H) 5 - 40 mg/dL   LDL Chol Calc (NIH) 849 (H) 0 - 99 mg/dL  TSH   Collection Time: 09/16/23  8:19 AM  Result Value Ref Range   TSH 2.110 0.450 - 4.500 uIU/mL  Bayer DCA Hb A1c Waived   Collection Time: 09/16/23  8:52 AM  Result Value Ref Range   HB A1C (BAYER DCA - WAIVED) 5.1 4.8 - 5.6 %      Assessment & Plan:   Problem List Items Addressed This Visit       Cardiovascular and Mediastinum   Migraines - Primary   In exacerbation and doing very poorly. Will start her on PRN maxalt  and obtain MRI given significant change in her migraine pattern. Recheck 1 month. Call with any concerns.       Relevant Medications   rizatriptan  (MAXALT ) 10 MG tablet   Other Relevant Orders   MR Brain Wo Contrast     Follow up plan: Return in about 4 weeks (around 01/27/2024).

## 2023-12-30 NOTE — Assessment & Plan Note (Signed)
 In exacerbation and doing very poorly. Will start her on PRN maxalt  and obtain MRI given significant change in her migraine pattern. Recheck 1 month. Call with any concerns.

## 2024-02-02 ENCOUNTER — Encounter: Payer: Self-pay | Admitting: Family Medicine

## 2024-02-02 ENCOUNTER — Ambulatory Visit (INDEPENDENT_AMBULATORY_CARE_PROVIDER_SITE_OTHER): Admitting: Family Medicine

## 2024-02-02 ENCOUNTER — Other Ambulatory Visit: Payer: Self-pay

## 2024-02-02 VITALS — BP 117/75 | HR 64 | Temp 97.9°F | Wt 227.0 lb

## 2024-02-02 DIAGNOSIS — G43709 Chronic migraine without aura, not intractable, without status migrainosus: Secondary | ICD-10-CM | POA: Diagnosis not present

## 2024-02-02 DIAGNOSIS — F325 Major depressive disorder, single episode, in full remission: Secondary | ICD-10-CM

## 2024-02-02 MED ORDER — BUPROPION HCL ER (SR) 150 MG PO TB12
ORAL_TABLET | ORAL | 2 refills | Status: DC
  Filled 2024-02-02 – 2024-02-17 (×2): qty 60, 37d supply, fill #0

## 2024-02-02 NOTE — Progress Notes (Signed)
 BP 117/75   Pulse 64   Temp 97.9 F (36.6 C) (Oral)   Wt 227 lb (103 kg)   LMP 01/07/2024 (Exact Date)   SpO2 99%   BMI 41.52 kg/m    Subjective:    Patient ID: Cynthia George, female    DOB: Oct 25, 1985, 38 y.o.   MRN: [id number]  HPI: Cynthia George is a 38 y.o. female  Chief Complaint  Patient presents with   Migraine   Feeling much better. Has not had a migraine since she was here last time. Did not need to take her maxalt - has it at home. No other concerns.   ANXIETY/STRESS Duration: off and on for months Status:off and on Anxious mood: yes  Excessive worrying: yes Irritability: no  Sweating: no Nausea: no Palpitations:no Hyperventilation: no Panic attacks: no Agoraphobia: no  Obscessions/compulsions: no Depressed mood: no    02/02/2024    3:32 PM 09/16/2023    8:38 AM 02/03/2023    8:35 AM 08/02/2022    9:04 AM 06/11/2022   11:09 AM  Depression screen PHQ 2/9  Decreased Interest 0 0 0 1 0  Down, Depressed, Hopeless 0 0 0 1 0  PHQ - 2 Score 0 0 0 2 0  Altered sleeping 0 0 0 1 0  Tired, decreased energy 0 0 0 1 0  Change in appetite 3 0 0 1 0  Feeling bad or failure about yourself  0 0 0 1 0  Trouble concentrating 1 0 0 2 0  Moving slowly or fidgety/restless 0 0 0 2 0  Suicidal thoughts 0 0 0 0 0  PHQ-9 Score 4 0 0 10 0  Difficult doing work/chores Somewhat difficult  Not difficult at all Somewhat difficult Not difficult at all      02/02/2024    3:32 PM 09/16/2023    8:39 AM 02/03/2023    8:35 AM 08/02/2022    9:05 AM  GAD 7 : Generalized Anxiety Score  Nervous, Anxious, on Edge 1 0 1 1  Control/stop worrying 1 0 1 1  Worry too much - different things 1 0 1 1  Trouble relaxing 1 0 1 2  Restless 1 0 2 2  Easily annoyed or irritable 1 0 1 3  Afraid - awful might happen 1 0 0 1  Total GAD 7 Score 7 0 7 11  Anxiety Difficulty Somewhat difficult  Not difficult at all Somewhat difficult   Anhedonia: no Weight changes: yes Insomnia: no    Hypersomnia: no Fatigue/loss of energy: yes Feelings of worthlessness: no Feelings of guilt: no Impaired concentration/indecisiveness: no Suicidal ideations: no  Crying spells: no Recent Stressors/Life Changes: yes   Relationship problems: no   Family stress: yes     Financial stress: no    Job stress: no    Recent death/loss: no   Relevant past medical, surgical, family and social history reviewed and updated as indicated. Interim medical history since our last visit reviewed. Allergies and medications reviewed and updated.  Review of Systems  Constitutional: Negative.   Respiratory: Negative.    Cardiovascular: Negative.   Musculoskeletal: Negative.   Skin: Negative.   Neurological: Negative.   Psychiatric/Behavioral:  Negative for behavioral problems, confusion, decreased concentration, dysphoric mood, hallucinations, self-injury, sleep disturbance and suicidal ideas. The patient is nervous/anxious. The patient is not hyperactive.     Per HPI unless specifically indicated above     Objective:    BP 117/75   Pulse 64  Temp 97.9 F (36.6 C) (Oral)   Wt 227 lb (103 kg)   LMP 01/07/2024 (Exact Date)   SpO2 99%   BMI 41.52 kg/m   Wt Readings from Last 3 Encounters:  02/02/24 227 lb (103 kg)  12/30/23 216 lb (98 kg)  09/16/23 215 lb 9.6 oz (97.8 kg)    Physical Exam Vitals and nursing note reviewed.  Constitutional:      General: She is not in acute distress.    Appearance: Normal appearance. She is not ill-appearing, toxic-appearing or diaphoretic.  HENT:     Head: Normocephalic and atraumatic.     Right Ear: External ear normal.     Left Ear: External ear normal.     Nose: Nose normal.     Mouth/Throat:     Mouth: Mucous membranes are moist.     Pharynx: Oropharynx is clear.  Eyes:     General: No scleral icterus.       Right eye: No discharge.        Left eye: No discharge.     Extraocular Movements: Extraocular movements intact.      Conjunctiva/sclera: Conjunctivae normal.     Pupils: Pupils are equal, round, and reactive to light.  Cardiovascular:     Rate and Rhythm: Normal rate and regular rhythm.     Pulses: Normal pulses.     Heart sounds: Normal heart sounds. No murmur heard.    No friction rub. No gallop.  Pulmonary:     Effort: Pulmonary effort is normal. No respiratory distress.     Breath sounds: Normal breath sounds. No stridor. No wheezing, rhonchi or rales.  Chest:     Chest wall: No tenderness.  Musculoskeletal:        General: Normal range of motion.     Cervical back: Normal range of motion and neck supple.  Skin:    General: Skin is warm and dry.     Capillary Refill: Capillary refill takes less than 2 seconds.     Coloration: Skin is not jaundiced or pale.     Findings: No bruising, erythema, lesion or rash.  Neurological:     General: No focal deficit present.     Mental Status: She is alert and oriented to person, place, and time. Mental status is at baseline.  Psychiatric:        Mood and Affect: Mood normal.        Behavior: Behavior normal.        Thought Content: Thought content normal.        Judgment: Judgment normal.     Results for orders placed or performed in visit on 09/16/23  CBC with Differential/Platelet   Collection Time: 09/16/23  8:19 AM  Result Value Ref Range   WBC 6.4 3.4 - 10.8 x10E3/uL   RBC 4.57 3.77 - 5.28 x10E6/uL   Hemoglobin 13.3 11.1 - 15.9 g/dL   Hematocrit 58.9 65.9 - 46.6 %   MCV 90 79 - 97 fL   MCH 29.1 26.6 - 33.0 pg   MCHC 32.4 31.5 - 35.7 g/dL   RDW 86.9 88.2 - 84.5 %   Platelets 293 150 - 450 x10E3/uL   Neutrophils 61 Not Estab. %   Lymphs 24 Not Estab. %   Monocytes 9 Not Estab. %   Eos 4 Not Estab. %   Basos 2 Not Estab. %   Neutrophils Absolute 3.9 1.4 - 7.0 x10E3/uL   Lymphocytes Absolute 1.6 0.7 - 3.1 x10E3/uL  Monocytes Absolute 0.5 0.1 - 0.9 x10E3/uL   EOS (ABSOLUTE) 0.3 0.0 - 0.4 x10E3/uL   Basophils Absolute 0.1 0.0 - 0.2  x10E3/uL   Immature Granulocytes 0 Not Estab. %   Immature Grans (Abs) 0.0 0.0 - 0.1 x10E3/uL  Comprehensive metabolic panel with GFR   Collection Time: 09/16/23  8:19 AM  Result Value Ref Range   Glucose 87 70 - 99 mg/dL   BUN 11 6 - 20 mg/dL   Creatinine, Ser 9.28 0.57 - 1.00 mg/dL   eGFR 887 >40 fO/fpw/8.26   BUN/Creatinine Ratio 15 9 - 23   Sodium 139 134 - 144 mmol/L   Potassium 4.3 3.5 - 5.2 mmol/L   Chloride 102 96 - 106 mmol/L   CO2 22 20 - 29 mmol/L   Calcium 9.2 8.7 - 10.2 mg/dL   Total Protein 6.9 6.0 - 8.5 g/dL   Albumin 4.4 3.9 - 4.9 g/dL   Globulin, Total 2.5 1.5 - 4.5 g/dL   Bilirubin Total 0.7 0.0 - 1.2 mg/dL   Alkaline Phosphatase 55 44 - 121 IU/L   AST 11 0 - 40 IU/L   ALT 13 0 - 32 IU/L  Lipid Panel w/o Chol/HDL Ratio   Collection Time: 09/16/23  8:19 AM  Result Value Ref Range   Cholesterol, Total 222 (H) 100 - 199 mg/dL   Triglycerides 773 (H) 0 - 149 mg/dL   HDL 30 (L) >60 mg/dL   VLDL Cholesterol Cal 42 (H) 5 - 40 mg/dL   LDL Chol Calc (NIH) 849 (H) 0 - 99 mg/dL  TSH   Collection Time: 09/16/23  8:19 AM  Result Value Ref Range   TSH 2.110 0.450 - 4.500 uIU/mL  Bayer DCA Hb A1c Waived   Collection Time: 09/16/23  8:52 AM  Result Value Ref Range   HB A1C (BAYER DCA - WAIVED) 5.1 4.8 - 5.6 %      Assessment & Plan:   Problem List Items Addressed This Visit       Cardiovascular and Mediastinum   Migraines - Primary   Doing much better. Has maxalt . Will hold on MRI for now. Call with any concerns.       Relevant Medications   buPROPion (WELLBUTRIN SR) 150 MG 12 hr tablet     Other   Major depression, single episode, in complete remission   Will start her on wellbutrin to help with mood and binge eating. Call with any concerns. Continue to monitor. Recheck 4 weeks.       Relevant Medications   buPROPion (WELLBUTRIN SR) 150 MG 12 hr tablet     Follow up plan: Return in about 6 weeks (around 03/15/2024).

## 2024-02-02 NOTE — Assessment & Plan Note (Signed)
 Doing much better. Has maxalt . Will hold on MRI for now. Call with any concerns.

## 2024-02-02 NOTE — Assessment & Plan Note (Signed)
 Will start her on wellbutrin to help with mood and binge eating. Call with any concerns. Continue to monitor. Recheck 4 weeks.

## 2024-02-12 ENCOUNTER — Other Ambulatory Visit: Payer: Self-pay

## 2024-02-17 ENCOUNTER — Other Ambulatory Visit: Payer: Self-pay

## 2024-03-01 ENCOUNTER — Other Ambulatory Visit: Payer: Self-pay

## 2024-03-13 ENCOUNTER — Ambulatory Visit: Admitting: Family Medicine

## 2024-03-13 ENCOUNTER — Other Ambulatory Visit: Payer: Self-pay

## 2024-03-13 VITALS — BP 115/83 | HR 69 | Temp 98.2°F | Ht 62.0 in | Wt 224.0 lb

## 2024-03-13 DIAGNOSIS — F3281 Premenstrual dysphoric disorder: Secondary | ICD-10-CM | POA: Insufficient documentation

## 2024-03-13 DIAGNOSIS — F325 Major depressive disorder, single episode, in full remission: Secondary | ICD-10-CM

## 2024-03-13 DIAGNOSIS — R5382 Chronic fatigue, unspecified: Secondary | ICD-10-CM | POA: Diagnosis not present

## 2024-03-13 MED ORDER — FLUOXETINE HCL 40 MG PO CAPS
ORAL_CAPSULE | ORAL | 3 refills | Status: DC
  Filled 2024-03-13: qty 8, 35d supply, fill #0
  Filled 2024-05-03: qty 8, 35d supply, fill #1

## 2024-03-13 NOTE — Progress Notes (Unsigned)
 BP 115/83   Pulse 69   Temp 98.2 F (36.8 C) (Oral)   Ht 5' 2 (1.575 m)   Wt 224 lb (101.6 kg)   LMP 02/12/2024 (Exact Date)   SpO2 96%   BMI 40.97 kg/m    Subjective:    Patient ID: Cynthia George, female    DOB: [birth year], 38 y.o.   MRN: [id number]  HPI: Cynthia George is a 38 y.o. female  Chief Complaint  Patient presents with  . Migraine    *  . Depression   ANXIETY/DPERESSION- felt worse on her wellbutrin . Noticing that her mood is way worse on  Duration:{Blank single:19197::controlled,uncontrolled,better,worse,exacerbated,stable} Anxious mood: {Blank single:19197::yes,no}  Excessive worrying: {Blank single:19197::yes,no} Irritability: {Blank single:19197::yes,no}  Sweating: {Blank single:19197::yes,no} Nausea: {Blank single:19197::yes,no} Palpitations:{Blank single:19197::yes,no} Hyperventilation: {Blank single:19197::yes,no} Panic attacks: {Blank single:19197::yes,no} Agoraphobia: {Blank single:19197::yes,no}  Obscessions/compulsions: {Blank single:19197::yes,no} Depressed mood: {Blank single:19197::yes,no}    02/02/2024    3:32 PM 09/16/2023    8:38 AM 02/03/2023    8:35 AM 08/02/2022    9:04 AM 06/11/2022   11:09 AM  Depression screen PHQ 2/9  Decreased Interest 0 0 0 1 0  Down, Depressed, Hopeless 0 0 0 1 0  PHQ - 2 Score 0 0 0 2 0  Altered sleeping 0 0 0 1 0  Tired, decreased energy 0 0 0 1 0  Change in appetite 3 0 0 1 0  Feeling bad or failure about yourself  0 0 0 1 0  Trouble concentrating 1 0 0 2 0  Moving slowly or fidgety/restless 0 0 0 2 0  Suicidal thoughts 0 0 0 0 0  PHQ-9 Score 4  0  0  10  0   Difficult doing work/chores Somewhat difficult  Not difficult at all Somewhat difficult Not difficult at all     Data saved with a previous flowsheet row definition   Anhedonia: {Blank single:19197::yes,no} Weight changes: {Blank single:19197::yes,no} Insomnia: {Blank  single:19197::yes,no} {Blank single:19197::hard to fall asleep,hard to stay asleep}  Hypersomnia: {Blank single:19197::yes,no} Fatigue/loss of energy: {Blank single:19197::yes,no} Feelings of worthlessness: {Blank single:19197::yes,no} Feelings of guilt: {Blank single:19197::yes,no} Impaired concentration/indecisiveness: {Blank single:19197::yes,no} Suicidal ideations: {Blank single:19197::yes,no}  Crying spells: {Blank single:19197::yes,no} Recent Stressors/Life Changes: {Blank single:19197::yes,no}   Relationship problems: {Blank single:19197::yes,no}   Family stress: {Blank single:19197::yes,no}     Financial stress: {Blank single:19197::yes,no}    Job stress: {Blank single:19197::yes,no}    Recent death/loss: {Blank single:19197::yes,no}  Relevant past medical, surgical, family and social history reviewed and updated as indicated. Interim medical history since our last visit reviewed. Allergies and medications reviewed and updated.  Review of Systems  Per HPI unless specifically indicated above     Objective:    BP 115/83   Pulse 69   Temp 98.2 F (36.8 C) (Oral)   Ht 5' 2 (1.575 m)   Wt 224 lb (101.6 kg)   LMP 02/12/2024 (Exact Date)   SpO2 96%   BMI 40.97 kg/m   Wt Readings from Last 3 Encounters:  03/13/24 224 lb (101.6 kg)  02/02/24 227 lb (103 kg)  12/30/23 216 lb (98 kg)    Physical Exam  Results for orders placed or performed in visit on 09/16/23  CBC with Differential/Platelet   Collection Time: 09/16/23  8:19 AM  Result Value Ref Range   WBC 6.4 3.4 - 10.8 x10E3/uL   RBC 4.57 3.77 - 5.28 x10E6/uL   Hemoglobin 13.3 11.1 - 15.9 g/dL   Hematocrit 58.9 65.9 - 46.6 %   MCV  90 79 - 97 fL   MCH 29.1 26.6 - 33.0 pg   MCHC 32.4 31.5 - 35.7 g/dL   RDW 86.9 88.2 - 84.5 %   Platelets 293 150 - 450 x10E3/uL   Neutrophils 61 Not Estab. %   Lymphs 24 Not Estab. %   Monocytes 9 Not Estab. %   Eos 4 Not  Estab. %   Basos 2 Not Estab. %   Neutrophils Absolute 3.9 1.4 - 7.0 x10E3/uL   Lymphocytes Absolute 1.6 0.7 - 3.1 x10E3/uL   Monocytes Absolute 0.5 0.1 - 0.9 x10E3/uL   EOS (ABSOLUTE) 0.3 0.0 - 0.4 x10E3/uL   Basophils Absolute 0.1 0.0 - 0.2 x10E3/uL   Immature Granulocytes 0 Not Estab. %   Immature Grans (Abs) 0.0 0.0 - 0.1 x10E3/uL  Comprehensive metabolic panel with GFR   Collection Time: 09/16/23  8:19 AM  Result Value Ref Range   Glucose 87 70 - 99 mg/dL   BUN 11 6 - 20 mg/dL   Creatinine, Ser 9.28 0.57 - 1.00 mg/dL   eGFR 887 >40 fO/fpw/8.26   BUN/Creatinine Ratio 15 9 - 23   Sodium 139 134 - 144 mmol/L   Potassium 4.3 3.5 - 5.2 mmol/L   Chloride 102 96 - 106 mmol/L   CO2 22 20 - 29 mmol/L   Calcium 9.2 8.7 - 10.2 mg/dL   Total Protein 6.9 6.0 - 8.5 g/dL   Albumin 4.4 3.9 - 4.9 g/dL   Globulin, Total 2.5 1.5 - 4.5 g/dL   Bilirubin Total 0.7 0.0 - 1.2 mg/dL   Alkaline Phosphatase 55 44 - 121 IU/L   AST 11 0 - 40 IU/L   ALT 13 0 - 32 IU/L  Lipid Panel w/o Chol/HDL Ratio   Collection Time: 09/16/23  8:19 AM  Result Value Ref Range   Cholesterol, Total 222 (H) 100 - 199 mg/dL   Triglycerides 773 (H) 0 - 149 mg/dL   HDL 30 (L) >60 mg/dL   VLDL Cholesterol Cal 42 (H) 5 - 40 mg/dL   LDL Chol Calc (NIH) 849 (H) 0 - 99 mg/dL  TSH   Collection Time: 09/16/23  8:19 AM  Result Value Ref Range   TSH 2.110 0.450 - 4.500 uIU/mL  Bayer DCA Hb A1c Waived   Collection Time: 09/16/23  8:52 AM  Result Value Ref Range   HB A1C (BAYER DCA - WAIVED) 5.1 4.8 - 5.6 %      Assessment & Plan:   Problem List Items Addressed This Visit   None    Follow up plan: No follow-ups on file.

## 2024-03-14 ENCOUNTER — Encounter: Payer: Self-pay | Admitting: Family Medicine

## 2024-03-14 ENCOUNTER — Ambulatory Visit: Payer: Self-pay | Admitting: Family Medicine

## 2024-03-14 ENCOUNTER — Other Ambulatory Visit: Payer: Self-pay

## 2024-03-14 LAB — VITAMIN D 25 HYDROXY (VIT D DEFICIENCY, FRACTURES): Vit D, 25-Hydroxy: 14.3 ng/mL — ABNORMAL LOW (ref 30.0–100.0)

## 2024-03-14 MED ORDER — VITAMIN D (ERGOCALCIFEROL) 1.25 MG (50000 UNIT) PO CAPS
50000.0000 [IU] | ORAL_CAPSULE | ORAL | 1 refills | Status: AC
  Filled 2024-03-14: qty 12, 84d supply, fill #0
  Filled 2024-05-03 – 2024-05-05 (×3): qty 12, 84d supply, fill #1

## 2024-03-14 NOTE — Assessment & Plan Note (Signed)
 Will treat with weekly prozac  to help with PMDD and for ease of dosing. Recheck in about 6 weeks. Call with any concerns.

## 2024-04-07 DIAGNOSIS — K8010 Calculus of gallbladder with chronic cholecystitis without obstruction: Secondary | ICD-10-CM | POA: Diagnosis not present

## 2024-04-07 DIAGNOSIS — K810 Acute cholecystitis: Secondary | ICD-10-CM | POA: Diagnosis not present

## 2024-04-07 DIAGNOSIS — E66812 Obesity, class 2: Secondary | ICD-10-CM | POA: Diagnosis not present

## 2024-04-07 DIAGNOSIS — R590 Localized enlarged lymph nodes: Secondary | ICD-10-CM | POA: Diagnosis not present

## 2024-04-07 DIAGNOSIS — R1011 Right upper quadrant pain: Secondary | ICD-10-CM | POA: Diagnosis not present

## 2024-04-07 DIAGNOSIS — R7401 Elevation of levels of liver transaminase levels: Secondary | ICD-10-CM | POA: Diagnosis not present

## 2024-04-07 DIAGNOSIS — Z79890 Hormone replacement therapy (postmenopausal): Secondary | ICD-10-CM | POA: Diagnosis not present

## 2024-04-07 DIAGNOSIS — E039 Hypothyroidism, unspecified: Secondary | ICD-10-CM | POA: Diagnosis not present

## 2024-04-07 DIAGNOSIS — K838 Other specified diseases of biliary tract: Secondary | ICD-10-CM | POA: Diagnosis not present

## 2024-04-07 DIAGNOSIS — Z6838 Body mass index (BMI) 38.0-38.9, adult: Secondary | ICD-10-CM | POA: Diagnosis not present

## 2024-04-07 DIAGNOSIS — K8050 Calculus of bile duct without cholangitis or cholecystitis without obstruction: Secondary | ICD-10-CM | POA: Diagnosis not present

## 2024-04-07 DIAGNOSIS — K828 Other specified diseases of gallbladder: Secondary | ICD-10-CM | POA: Diagnosis not present

## 2024-04-07 DIAGNOSIS — K8020 Calculus of gallbladder without cholecystitis without obstruction: Secondary | ICD-10-CM | POA: Diagnosis not present

## 2024-04-07 HISTORY — PX: GALLBLADDER SURGERY: SHX652

## 2024-05-03 ENCOUNTER — Other Ambulatory Visit: Payer: Self-pay

## 2024-05-03 MED FILL — Levothyroxine Sodium Tab 100 MCG: ORAL | 90 days supply | Qty: 90 | Fill #2 | Status: AC

## 2024-05-04 ENCOUNTER — Ambulatory Visit: Admitting: Family Medicine

## 2024-05-04 ENCOUNTER — Other Ambulatory Visit: Payer: Self-pay

## 2024-05-04 ENCOUNTER — Encounter: Payer: Self-pay | Admitting: Family Medicine

## 2024-05-04 VITALS — BP 133/87 | HR 62 | Temp 98.1°F | Resp 17 | Ht 62.01 in | Wt 226.0 lb

## 2024-05-04 DIAGNOSIS — F3281 Premenstrual dysphoric disorder: Secondary | ICD-10-CM

## 2024-05-04 DIAGNOSIS — F325 Major depressive disorder, single episode, in full remission: Secondary | ICD-10-CM | POA: Diagnosis not present

## 2024-05-04 DIAGNOSIS — R748 Abnormal levels of other serum enzymes: Secondary | ICD-10-CM

## 2024-05-04 DIAGNOSIS — Z9049 Acquired absence of other specified parts of digestive tract: Secondary | ICD-10-CM

## 2024-05-04 MED ORDER — FLUOXETINE HCL 40 MG PO CAPS
ORAL_CAPSULE | ORAL | 1 refills | Status: AC
  Filled 2024-05-05: qty 24, 30d supply, fill #0

## 2024-05-04 NOTE — Assessment & Plan Note (Signed)
 Was doing great on meds until she had surgery- just restarted them. Continue current regimen. Continue to monitor. Recheck at physical.

## 2024-05-04 NOTE — Progress Notes (Signed)
 "  BP 133/87 (BP Location: Left Arm, Patient Position: Sitting, Cuff Size: Large)   Pulse 62   Temp 98.1 F (36.7 C) (Oral)   Resp 17   Ht 5' 2.01 (1.575 m)   Wt 226 lb (102.5 kg)   SpO2 98%   BMI 41.33 kg/m    Subjective:    Patient ID: Cynthia George, female    DOB: 13-Feb-1986, 39 y.o.   MRN: [id number]  HPI: Cynthia George is a 39 y.o. female  No chief complaint on file.  Had surgery about a month ago. Has had a lot of issues with one of the incisions healing. Had reaction to bandaid- has been leaving it open now. Due to see surgery again next week. Can't wait to get back to work. Labs were really abnormal when she was in the hospital, but were improving after surgery  DEPRESSION- was doing well with the medicine, but was away from home due to surgery. Has restarted them and is feeling better. No concerns.  Mood status: better Satisfied with current treatment?: yes Symptom severity: moderate  Duration of current treatment : months Side effects: no Medication compliance: good compliance Psychotherapy/counseling: no  Previous psychiatric medications: fluoxetine  Depressed mood: yes Anxious mood: yes Anhedonia: no Significant weight loss or gain: no Insomnia: no  Fatigue: yes Feelings of worthlessness or guilt: no Impaired concentration/indecisiveness: no Suicidal ideations: no Hopelessness: no Crying spells: no    05/04/2024    3:46 PM 02/02/2024    3:32 PM 09/16/2023    8:38 AM 02/03/2023    8:35 AM 08/02/2022    9:04 AM  Depression screen PHQ 2/9  Decreased Interest 0 0 0 0 1  Down, Depressed, Hopeless 0 0 0 0 1  PHQ - 2 Score 0 0 0 0 2  Altered sleeping 0 0 0 0 1  Tired, decreased energy 0 0 0 0 1  Change in appetite 0 3 0 0 1  Feeling bad or failure about yourself  0 0 0 0 1  Trouble concentrating 0 1 0 0 2  Moving slowly or fidgety/restless 0 0 0 0 2  Suicidal thoughts 0 0 0 0 0  PHQ-9 Score 0 4  0  0  10   Difficult doing work/chores Not difficult at  all Somewhat difficult  Not difficult at all Somewhat difficult     Data saved with a previous flowsheet row definition      02/02/2024    3:32 PM 09/16/2023    8:39 AM 02/03/2023    8:35 AM 08/02/2022    9:05 AM  GAD 7 : Generalized Anxiety Score  Nervous, Anxious, on Edge 1 0 1 1  Control/stop worrying 1 0 1 1  Worry too much - different things 1 0 1 1  Trouble relaxing 1 0 1 2  Restless 1 0 2 2  Easily annoyed or irritable 1 0 1 3  Afraid - awful might happen 1 0 0 1  Total GAD 7 Score 7 0 7 11  Anxiety Difficulty Somewhat difficult  Not difficult at all Somewhat difficult   Relevant past medical, surgical, family and social history reviewed and updated as indicated. Interim medical history since our last visit reviewed. Allergies and medications reviewed and updated.  Review of Systems  Constitutional: Negative.   Respiratory: Negative.    Cardiovascular: Negative.   Musculoskeletal: Negative.   Neurological: Negative.   Psychiatric/Behavioral: Negative.      Per HPI unless specifically indicated  above     Objective:    BP 133/87 (BP Location: Left Arm, Patient Position: Sitting, Cuff Size: Large)   Pulse 62   Temp 98.1 F (36.7 C) (Oral)   Resp 17   Ht 5' 2.01 (1.575 m)   Wt 226 lb (102.5 kg)   SpO2 98%   BMI 41.33 kg/m   Wt Readings from Last 3 Encounters:  05/04/24 226 lb (102.5 kg)  03/13/24 224 lb (101.6 kg)  02/02/24 227 lb (103 kg)    Physical Exam Vitals and nursing note reviewed.  Constitutional:      General: She is not in acute distress.    Appearance: Normal appearance. She is not ill-appearing, toxic-appearing or diaphoretic.  HENT:     Head: Normocephalic and atraumatic.     Right Ear: External ear normal.     Left Ear: External ear normal.     Nose: Nose normal.     Mouth/Throat:     Mouth: Mucous membranes are moist.     Pharynx: Oropharynx is clear.  Eyes:     General: No scleral icterus.       Right eye: No discharge.         Left eye: No discharge.     Extraocular Movements: Extraocular movements intact.     Conjunctiva/sclera: Conjunctivae normal.     Pupils: Pupils are equal, round, and reactive to light.  Cardiovascular:     Rate and Rhythm: Normal rate and regular rhythm.     Pulses: Normal pulses.     Heart sounds: Normal heart sounds. No murmur heard.    No friction rub. No gallop.  Pulmonary:     Effort: Pulmonary effort is normal. No respiratory distress.     Breath sounds: Normal breath sounds. No stridor. No wheezing, rhonchi or rales.  Chest:     Chest wall: No tenderness.  Abdominal:     Comments: Incision in RUQ is open with some drainage, no pus, no redness, no heat  Musculoskeletal:        General: Normal range of motion.     Cervical back: Normal range of motion and neck supple.  Skin:    General: Skin is warm and dry.     Capillary Refill: Capillary refill takes less than 2 seconds.     Coloration: Skin is not jaundiced or pale.     Findings: No bruising, erythema, lesion or rash.  Neurological:     General: No focal deficit present.     Mental Status: She is alert and oriented to person, place, and time. Mental status is at baseline.  Psychiatric:        Mood and Affect: Mood normal.        Behavior: Behavior normal.        Thought Content: Thought content normal.        Judgment: Judgment normal.     Results for orders placed or performed in visit on 03/13/24  VITAMIN D  25 Hydroxy (Vit-D Deficiency, Fractures)   Collection Time: 03/13/24  3:52 PM  Result Value Ref Range   Vit D, 25-Hydroxy 14.3 (L) 30.0 - 100.0 ng/mL      Assessment & Plan:   Problem List Items Addressed This Visit       Other   Major depression, single episode, in complete remission   Was doing great on meds until she had surgery- just restarted them. Continue current regimen. Continue to monitor. Recheck at physical.  Relevant Medications   FLUoxetine  (PROZAC ) 40 MG capsule   PMDD  (premenstrual dysphoric disorder)   Was doing great on meds until she had surgery- just restarted them. Continue current regimen. Continue to monitor. Recheck at physical.       Relevant Medications   FLUoxetine  (PROZAC ) 40 MG capsule   Other Visit Diagnoses       S/P laparoscopic cholecystectomy    -  Primary   With significantly elevated liver enzymes- will recheck to confirm it's back to normal. Call with any concenrs. Continue to follow with surgery.   Relevant Orders   Comprehensive metabolic panel with GFR   CBC with Differential/Platelet   Amylase   Lipase     Elevated liver enzymes       With significantly elevated liver enzymes- will recheck to confirm it's back to normal. Call with any concenrs. Continue to follow with surgery.        Follow up plan: Return in about 4 months (around 09/17/2024) for physical.      "

## 2024-05-05 ENCOUNTER — Other Ambulatory Visit: Payer: Self-pay

## 2024-05-05 LAB — CBC WITH DIFFERENTIAL/PLATELET
Basophils Absolute: 0.1 x10E3/uL (ref 0.0–0.2)
Basos: 2 %
EOS (ABSOLUTE): 0.3 x10E3/uL (ref 0.0–0.4)
Eos: 4 %
Hematocrit: 41.1 % (ref 34.0–46.6)
Hemoglobin: 13.4 g/dL (ref 11.1–15.9)
Immature Grans (Abs): 0 x10E3/uL (ref 0.0–0.1)
Immature Granulocytes: 0 %
Lymphocytes Absolute: 1.5 x10E3/uL (ref 0.7–3.1)
Lymphs: 19 %
MCH: 28.9 pg (ref 26.6–33.0)
MCHC: 32.6 g/dL (ref 31.5–35.7)
MCV: 89 fL (ref 79–97)
Monocytes Absolute: 0.7 x10E3/uL (ref 0.1–0.9)
Monocytes: 9 %
Neutrophils Absolute: 5.4 x10E3/uL (ref 1.4–7.0)
Neutrophils: 66 %
Platelets: 313 x10E3/uL (ref 150–450)
RBC: 4.63 x10E6/uL (ref 3.77–5.28)
RDW: 12.6 % (ref 11.7–15.4)
WBC: 8.1 x10E3/uL (ref 3.4–10.8)

## 2024-05-05 LAB — COMPREHENSIVE METABOLIC PANEL WITH GFR
ALT: 22 IU/L (ref 0–32)
AST: 17 IU/L (ref 0–40)
Albumin: 4.7 g/dL (ref 3.9–4.9)
Alkaline Phosphatase: 53 IU/L (ref 41–116)
BUN/Creatinine Ratio: 17 (ref 9–23)
BUN: 12 mg/dL (ref 6–20)
Bilirubin Total: 1 mg/dL (ref 0.0–1.2)
CO2: 23 mmol/L (ref 20–29)
Calcium: 9.1 mg/dL (ref 8.7–10.2)
Chloride: 102 mmol/L (ref 96–106)
Creatinine, Ser: 0.72 mg/dL (ref 0.57–1.00)
Globulin, Total: 2.6 g/dL (ref 1.5–4.5)
Glucose: 85 mg/dL (ref 70–99)
Potassium: 4.2 mmol/L (ref 3.5–5.2)
Sodium: 138 mmol/L (ref 134–144)
Total Protein: 7.3 g/dL (ref 6.0–8.5)
eGFR: 110 mL/min/1.73

## 2024-05-05 LAB — AMYLASE: Amylase: 37 U/L (ref 31–110)

## 2024-05-05 LAB — LIPASE: Lipase: 37 U/L (ref 14–72)

## 2024-05-06 ENCOUNTER — Ambulatory Visit: Payer: Self-pay | Admitting: Family Medicine

## 2024-05-07 ENCOUNTER — Other Ambulatory Visit: Payer: Self-pay

## 2024-05-10 ENCOUNTER — Other Ambulatory Visit: Payer: Self-pay

## 2024-05-19 ENCOUNTER — Other Ambulatory Visit: Payer: Self-pay

## 2024-05-21 ENCOUNTER — Other Ambulatory Visit: Payer: Self-pay

## 2024-05-24 ENCOUNTER — Other Ambulatory Visit: Payer: Self-pay

## 2024-09-17 ENCOUNTER — Encounter: Admitting: Family Medicine

## 2024-09-20 ENCOUNTER — Encounter: Admitting: Family Medicine
